# Patient Record
Sex: Male | Born: 1970 | Race: Black or African American | Hispanic: No | Marital: Married | Smoking: Never smoker
Health system: Southern US, Community
[De-identification: ages and names within clinical notes are randomized; demographics above are authoritative.]

## PROBLEM LIST (undated history)

## (undated) ENCOUNTER — Emergency Department (HOSPITAL_BASED_OUTPATIENT_CLINIC_OR_DEPARTMENT_OTHER): Admission: EM | Payer: 59

## (undated) DIAGNOSIS — G20 Parkinson's disease: Secondary | ICD-10-CM

## (undated) DIAGNOSIS — G20A1 Parkinson's disease without dyskinesia, without mention of fluctuations: Secondary | ICD-10-CM

## (undated) DIAGNOSIS — I10 Essential (primary) hypertension: Secondary | ICD-10-CM

## (undated) DIAGNOSIS — Z8711 Personal history of peptic ulcer disease: Secondary | ICD-10-CM

## (undated) DIAGNOSIS — N529 Male erectile dysfunction, unspecified: Secondary | ICD-10-CM

## (undated) DIAGNOSIS — R251 Tremor, unspecified: Secondary | ICD-10-CM

## (undated) DIAGNOSIS — R531 Weakness: Secondary | ICD-10-CM

## (undated) DIAGNOSIS — Z8719 Personal history of other diseases of the digestive system: Secondary | ICD-10-CM

## (undated) DIAGNOSIS — R011 Cardiac murmur, unspecified: Secondary | ICD-10-CM

## (undated) DIAGNOSIS — M1990 Unspecified osteoarthritis, unspecified site: Secondary | ICD-10-CM

## (undated) DIAGNOSIS — M25569 Pain in unspecified knee: Secondary | ICD-10-CM

## (undated) DIAGNOSIS — K5900 Constipation, unspecified: Secondary | ICD-10-CM

## (undated) DIAGNOSIS — G510 Bell's palsy: Secondary | ICD-10-CM

## (undated) DIAGNOSIS — G4700 Insomnia, unspecified: Secondary | ICD-10-CM

## (undated) DIAGNOSIS — N189 Chronic kidney disease, unspecified: Secondary | ICD-10-CM

## (undated) HISTORY — DX: Personal history of peptic ulcer disease: Z87.11

## (undated) HISTORY — DX: Constipation, unspecified: K59.00

## (undated) HISTORY — DX: Parkinson's disease: G20

## (undated) HISTORY — PX: UPPER GI ENDOSCOPY: SHX6162

## (undated) HISTORY — DX: Chronic kidney disease, unspecified: N18.9

## (undated) HISTORY — DX: Tremor, unspecified: R25.1

## (undated) HISTORY — DX: Pain in unspecified knee: M25.569

## (undated) HISTORY — DX: Weakness: R53.1

## (undated) HISTORY — DX: Male erectile dysfunction, unspecified: N52.9

## (undated) HISTORY — PX: NO PAST SURGERIES: SHX2092

## (undated) HISTORY — DX: Cardiac murmur, unspecified: R01.1

## (undated) HISTORY — DX: Parkinson's disease without dyskinesia, without mention of fluctuations: G20.A1

## (undated) HISTORY — DX: Unspecified osteoarthritis, unspecified site: M19.90

## (undated) HISTORY — DX: Insomnia, unspecified: G47.00

## (undated) HISTORY — DX: Personal history of other diseases of the digestive system: Z87.19

## (undated) HISTORY — DX: Essential (primary) hypertension: I10

---

## 1999-06-17 ENCOUNTER — Emergency Department (HOSPITAL_COMMUNITY): Admission: EM | Admit: 1999-06-17 | Discharge: 1999-06-17 | Payer: Self-pay | Admitting: Emergency Medicine

## 2000-07-20 ENCOUNTER — Emergency Department (HOSPITAL_COMMUNITY): Admission: EM | Admit: 2000-07-20 | Discharge: 2000-07-20 | Payer: Self-pay | Admitting: Emergency Medicine

## 2001-05-25 ENCOUNTER — Emergency Department (HOSPITAL_COMMUNITY): Admission: EM | Admit: 2001-05-25 | Discharge: 2001-05-25 | Payer: Self-pay

## 2003-01-08 ENCOUNTER — Emergency Department (HOSPITAL_COMMUNITY): Admission: EM | Admit: 2003-01-08 | Discharge: 2003-01-08 | Payer: Self-pay | Admitting: Emergency Medicine

## 2003-01-08 ENCOUNTER — Encounter: Payer: Self-pay | Admitting: Emergency Medicine

## 2003-02-23 ENCOUNTER — Encounter: Payer: Self-pay | Admitting: Family Medicine

## 2003-02-23 ENCOUNTER — Ambulatory Visit (HOSPITAL_COMMUNITY): Admission: RE | Admit: 2003-02-23 | Discharge: 2003-02-23 | Payer: Self-pay | Admitting: Family Medicine

## 2007-12-18 ENCOUNTER — Emergency Department (HOSPITAL_COMMUNITY): Admission: EM | Admit: 2007-12-18 | Discharge: 2007-12-18 | Payer: Self-pay | Admitting: Emergency Medicine

## 2008-08-14 ENCOUNTER — Emergency Department (HOSPITAL_COMMUNITY): Admission: EM | Admit: 2008-08-14 | Discharge: 2008-08-14 | Payer: Self-pay | Admitting: Emergency Medicine

## 2008-11-16 ENCOUNTER — Emergency Department (HOSPITAL_COMMUNITY): Admission: EM | Admit: 2008-11-16 | Discharge: 2008-11-17 | Payer: Self-pay | Admitting: Family Medicine

## 2008-11-21 ENCOUNTER — Ambulatory Visit: Payer: Self-pay | Admitting: Cardiovascular Disease

## 2008-11-21 DIAGNOSIS — R011 Cardiac murmur, unspecified: Secondary | ICD-10-CM | POA: Insufficient documentation

## 2008-11-21 DIAGNOSIS — I10 Essential (primary) hypertension: Secondary | ICD-10-CM | POA: Insufficient documentation

## 2008-11-21 DIAGNOSIS — R0789 Other chest pain: Secondary | ICD-10-CM | POA: Insufficient documentation

## 2008-11-28 ENCOUNTER — Ambulatory Visit: Payer: Self-pay

## 2008-11-28 ENCOUNTER — Encounter: Payer: Self-pay | Admitting: Cardiovascular Disease

## 2009-02-01 ENCOUNTER — Emergency Department (HOSPITAL_COMMUNITY): Admission: EM | Admit: 2009-02-01 | Discharge: 2009-02-01 | Payer: Self-pay | Admitting: Family Medicine

## 2009-09-02 ENCOUNTER — Emergency Department (HOSPITAL_COMMUNITY): Admission: EM | Admit: 2009-09-02 | Discharge: 2009-09-02 | Payer: Self-pay | Admitting: Emergency Medicine

## 2010-07-16 ENCOUNTER — Emergency Department (HOSPITAL_COMMUNITY): Admission: EM | Admit: 2010-07-16 | Discharge: 2010-07-16 | Payer: Self-pay | Admitting: Family Medicine

## 2010-08-26 ENCOUNTER — Emergency Department (HOSPITAL_COMMUNITY)
Admission: EM | Admit: 2010-08-26 | Discharge: 2010-08-26 | Payer: Self-pay | Source: Home / Self Care | Admitting: Emergency Medicine

## 2010-11-10 LAB — DIFFERENTIAL
Basophils Relative: 1 % (ref 0–1)
Eosinophils Absolute: 0.1 10*3/uL (ref 0.0–0.7)
Eosinophils Relative: 2 % (ref 0–5)
Lymphs Abs: 1.5 10*3/uL (ref 0.7–4.0)
Monocytes Relative: 8 % (ref 3–12)

## 2010-11-10 LAB — CBC
HCT: 44.4 % (ref 39.0–52.0)
Hemoglobin: 14.9 g/dL (ref 13.0–17.0)
MCHC: 33.6 g/dL (ref 30.0–36.0)
RDW: 13.4 % (ref 11.5–15.5)
WBC: 4.5 10*3/uL (ref 4.0–10.5)

## 2010-11-10 LAB — COMPREHENSIVE METABOLIC PANEL
ALT: 34 U/L (ref 0–53)
AST: 26 U/L (ref 0–37)
Alkaline Phosphatase: 38 U/L — ABNORMAL LOW (ref 39–117)
CO2: 27 mEq/L (ref 19–32)
Calcium: 9.1 mg/dL (ref 8.4–10.5)
GFR calc Af Amer: >60 mL/min (ref >60)
Potassium: 3.7 mEq/L (ref 3.5–5.1)
Sodium: 138 mEq/L (ref 135–145)
Total Protein: 7.4 g/dL (ref 6.0–8.3)

## 2010-11-10 LAB — POCT CARDIAC MARKERS: Troponin i, poc: <0.05 ng/mL (ref 0.00–0.09)

## 2010-11-16 LAB — URINALYSIS, ROUTINE W REFLEX MICROSCOPIC
Glucose, UA: NEGATIVE mg/dL
Nitrite: NEGATIVE
Specific Gravity, Urine: 1.022 (ref 1.005–1.030)
pH: 6 (ref 5.0–8.0)

## 2010-11-16 LAB — COMPREHENSIVE METABOLIC PANEL
ALT: 53 U/L (ref 0–53)
Alkaline Phosphatase: 34 U/L — ABNORMAL LOW (ref 39–117)
CO2: 29 mEq/L (ref 19–32)
Chloride: 99 mEq/L (ref 96–112)
Glucose, Bld: 95 mg/dL (ref 70–99)
Potassium: 3.8 mEq/L (ref 3.5–5.1)
Sodium: 136 mEq/L (ref 135–145)
Total Bilirubin: 0.4 mg/dL (ref 0.3–1.2)
Total Protein: 7.4 g/dL (ref 6.0–8.3)

## 2010-11-16 LAB — DIFFERENTIAL
Basophils Relative: 0 % (ref 0–1)
Eosinophils Absolute: 0.1 10*3/uL (ref 0.0–0.7)
Monocytes Relative: 8 % (ref 3–12)
Neutrophils Relative %: 57 % (ref 43–77)

## 2010-11-16 LAB — CBC
Hemoglobin: 14.6 g/dL (ref 13.0–17.0)
RBC: 5.48 MIL/uL (ref 4.22–5.81)
RDW: 13.8 % (ref 11.5–15.5)
WBC: 5.6 10*3/uL (ref 4.0–10.5)

## 2010-12-11 LAB — POCT CARDIAC MARKERS
Myoglobin, poc: 116 ng/mL (ref 12–200)
Troponin i, poc: <0.05 ng/mL (ref 0.00–0.09)

## 2010-12-11 LAB — LIPASE, BLOOD: Lipase: 30 U/L (ref 11–59)

## 2010-12-11 LAB — POCT I-STAT, CHEM 8
BUN: 15 mg/dL (ref 6–23)
Calcium, Ion: 1.11 mmol/L — ABNORMAL LOW (ref 1.12–1.32)
Creatinine, Ser: 1.1 mg/dL (ref 0.4–1.5)
TCO2: 29 mmol/L (ref 0–100)

## 2011-01-13 NOTE — Consult Note (Signed)
NAME:  Ruben Silva, Ruben Silva NO.:  [id number]   MEDICAL RECORD NO.:  [id number]          PATIENT TYPE:  EMS   LOCATION:  [facility]                         FACILITY:  [facility]   PHYSICIAN:  Vania Rea, M.D. DATE OF BIRTH:  Nov 12, 1970   DATE OF CONSULTATION:  11/16/2008  DATE OF DISCHARGE:  11/17/2008                                 CONSULTATION   REQUESTING PHYSICIAN:  Dr. Hilario Quarry.   PRIMARY CARE PHYSICIAN:  Dr. Renaye Rakers.   REASON FOR CONSULTATION:  Chest pain.   IMPRESSION:  1. Atypical chest pain, likely musculoskeletal.  2. Hypertension, controlled.  3. Hypokalemia, likely secondary to antihypertensive-duretic      medications.   RECOMMENDATIONS:  1. Treat hypokalemia with 40 mEq of K-Dur x1 dose.  Advise patient to      eat fresh fruits daily and follow up with his primary care      physician in 1-2 weeks to check serum potassium.  2. Repeat cardiac enzymes stat and if negative discharge the patient      home to follow up with [facility] on Monday to arrange      stress testing.  3. The patient is to return to the emergency room for any worsening of      symptoms.   HISTORY OF PRESENT ILLNESS:  This is a 40 year old athletic African  American gentleman who usually exercises vigorously in the gym about 5  times per week about 2 hours at a time without any difficulty.  However,  he has not been exercising for the past 3 months until this week when he  restarted his exercise and noticed his muscles have been very sore.  The  patient has been having episodic chest pain and pressure at rest for the  past 2 days and eventually decided to come to the emergency room to be  evaluated.  In the emergency room, the patient received aspirin 325 mg  x1 dose and he says the pain has resolved.  The pain is not associated  with shortness of breath, nausea, diaphoresis nor dizziness.  It is not  aggravated by activity.  It is not relieved by rest.  It has  not  particular pattern.  The patient says he feels convinced that it is  because he has been overdoing his workouts after a prolonged rest, but  he decided to come to check it out to be on the safe side.   The patient is hypertensive and does take Avalide with  hydrochlorothiazide daily.  He has been told in the past that he has a  heart murmur, but has never had a cardiac stress test.   He denies any fever, cough or cold.  He denies any shortness of breath,  orthopnea, lower extremity edema or PND.   PAST MEDICAL HISTORY:  1. Hypertension.  2. History of heart murmur.   MEDICATIONS:  Avalide 150 mg daily.   ALLERGIES:  NO KNOWN DRUG ALLERGIES.   SOCIAL HISTORY:  No history of tobacco, alcohol or illicit drug use.  He  is currently unemployed.  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   FAMILY HISTORY:  Significant for a mother with hypertension, otherwise  unremarkable.   REVIEW OF SYSTEMS:  A 10-point review of systems was essentially  negative.   PHYSICAL EXAMINATION:  GENERAL:  This is a young athletic African  American gentleman reclining in the stretcher in no acute distress.  VITAL SIGNS:  Temperature 97.1, pulse 60, respirations 16, blood  pressure 123/83.  He is saturating 100% on room air.  HEENT:  His pupils are round and equal.  Mucous membranes pink and  anicteric.  He is not dehydrated.  NECK:  No cervical lymphadenopathy or thyromegaly.  No jugulovenous  distention.  No carotid bruit.  CHEST:  Clear to auscultation bilaterally.  CARDIOVASCULAR:  Regular rhythm.  No murmur heard in the emergency room.  ABDOMEN:  Soft and nontender.  There are no masses.  EXTREMITIES:  Without edema.  He has 2+ dorsalis pedis pulses  bilaterally.  CENTRAL NERVOUS SYSTEM:  Cranial nerves II through XII are grossly  intact and he has no focal neurologic deficit.   LABORATORY DATA:  His hemoglobin is 14.3, hematocrit 42, sodium 139,  potassium 3.3, chloride 100, BUN  15, creatinine 1.1, glucose 102.  Ionized calcium is 1.1.  Cardiac markers; his myoglobin is 108, CK-MB  1.2, troponin is undetectable.  His lipase is 30.  EKG shows sinus  bradycardia with no ST segment changes.  His portable chest x-ray shows  clear lungs without infiltrate.      Vania Rea, M.D.  Electronically Signed     LC/MEDQ  D:  11/17/2008  T:  11/17/2008  Job:  [id number]   cc:   Renaye Rakers, M.D.  [facility]
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
## 2011-03-03 ENCOUNTER — Encounter: Payer: Self-pay | Admitting: Cardiovascular Disease

## 2011-05-26 LAB — DIFFERENTIAL
Basophils Relative: 0
Lymphs Abs: 2
Monocytes Relative: 8
Neutro Abs: 3.2
Neutrophils Relative %: 55

## 2011-05-26 LAB — BASIC METABOLIC PANEL
Calcium: 9.3
Creatinine, Ser: 1.06
GFR calc Af Amer: >60

## 2011-05-26 LAB — CBC
RBC: 5.31
WBC: 5.8

## 2011-05-27 ENCOUNTER — Emergency Department (HOSPITAL_COMMUNITY)
Admission: EM | Admit: 2011-05-27 | Discharge: 2011-05-27 | Disposition: A | Discharge status: Home / Self Care | Payer: Self-pay | Attending: Emergency Medicine | Admitting: Emergency Medicine

## 2011-05-27 DIAGNOSIS — I10 Essential (primary) hypertension: Secondary | ICD-10-CM | POA: Insufficient documentation

## 2011-05-27 DIAGNOSIS — K219 Gastro-esophageal reflux disease without esophagitis: Secondary | ICD-10-CM | POA: Insufficient documentation

## 2011-05-27 DIAGNOSIS — R0789 Other chest pain: Secondary | ICD-10-CM | POA: Insufficient documentation

## 2011-07-13 ENCOUNTER — Emergency Department (HOSPITAL_COMMUNITY): Payer: Self-pay

## 2011-07-13 ENCOUNTER — Emergency Department (HOSPITAL_COMMUNITY)
Admission: EM | Admit: 2011-07-13 | Discharge: 2011-07-13 | Disposition: A | Discharge status: Home / Self Care | Payer: Self-pay | Attending: Emergency Medicine | Admitting: Emergency Medicine

## 2011-07-13 ENCOUNTER — Encounter (HOSPITAL_COMMUNITY): Payer: Self-pay | Admitting: *Deleted

## 2011-07-13 DIAGNOSIS — M25519 Pain in unspecified shoulder: Secondary | ICD-10-CM | POA: Insufficient documentation

## 2011-07-13 DIAGNOSIS — M25619 Stiffness of unspecified shoulder, not elsewhere classified: Secondary | ICD-10-CM | POA: Insufficient documentation

## 2011-07-13 MED ORDER — IBUPROFEN 800 MG PO TABS: 800.0000 mg | ORAL_TABLET | Freq: Three times a day (TID) | ORAL | Status: AC

## 2011-07-13 NOTE — ED Notes (Signed)
Pt reports R shoulder pain x 3 weeks.  Denies injury but states that he was working out prior to onset of pain.  Pt also reports inability to sleep d/t pain.

## 2011-08-12 ENCOUNTER — Emergency Department (HOSPITAL_COMMUNITY)
Admission: EM | Admit: 2011-08-12 | Discharge: 2011-08-12 | Disposition: A | Discharge status: Home / Self Care | Payer: Self-pay | Attending: Emergency Medicine | Admitting: Emergency Medicine

## 2011-08-12 ENCOUNTER — Encounter (HOSPITAL_COMMUNITY): Payer: Self-pay | Admitting: *Deleted

## 2011-08-12 DIAGNOSIS — N390 Urinary tract infection, site not specified: Secondary | ICD-10-CM | POA: Insufficient documentation

## 2011-08-12 DIAGNOSIS — I10 Essential (primary) hypertension: Secondary | ICD-10-CM | POA: Insufficient documentation

## 2011-08-12 DIAGNOSIS — R509 Fever, unspecified: Secondary | ICD-10-CM | POA: Insufficient documentation

## 2011-08-12 DIAGNOSIS — R319 Hematuria, unspecified: Secondary | ICD-10-CM | POA: Insufficient documentation

## 2011-08-12 LAB — URINALYSIS, ROUTINE W REFLEX MICROSCOPIC
Glucose, UA: NEGATIVE mg/dL
Specific Gravity, Urine: 1.023 (ref 1.005–1.030)
Urobilinogen, UA: 1 mg/dL (ref 0.0–1.0)

## 2011-08-12 LAB — URINE MICROSCOPIC-ADD ON

## 2011-08-12 LAB — GLUCOSE, CAPILLARY

## 2011-08-12 MED ORDER — CIPROFLOXACIN HCL 500 MG PO TABS: 500.0000 mg | ORAL_TABLET | Freq: Two times a day (BID) | ORAL | Status: AC

## 2011-08-12 MED ORDER — PHENAZOPYRIDINE HCL 200 MG PO TABS: 200.0000 mg | ORAL_TABLET | Freq: Three times a day (TID) | ORAL | Status: AC | PRN

## 2011-08-12 MED ORDER — IRBESARTAN-HYDROCHLOROTHIAZIDE 150-12.5 MG PO TABS: 1.0000 | ORAL_TABLET | Freq: Every day | ORAL | Status: DC

## 2011-08-12 MED ORDER — CIPROFLOXACIN HCL 500 MG PO TABS
500.0000 mg | ORAL_TABLET | Freq: Once | ORAL | Status: AC
  Administered 2011-08-12: 500 mg via ORAL
  Filled 2011-08-12: qty 1

## 2011-08-12 NOTE — ED Provider Notes (Signed)
History     CSN: [id number] Arrival date & time: 08/12/2011  8:33 PM   First MD Initiated Contact with Patient 08/12/11 2157      Chief Complaint  Patient presents with  . Hematuria    (Consider location/radiation/quality/duration/timing/severity/associated sxs/prior treatment) Patient is a 40 y.o. male presenting with hematuria. The history is provided by the patient.  Hematuria This is a new problem. The current episode started today. The problem is unchanged. He describes the hematuria as gross hematuria. The hematuria occurs throughout his entire urinary stream. The pain is mild. He describes his urine color as dark red. Irritative symptoms include frequency and urgency. Obstructive symptoms do not include dribbling, incomplete emptying, an intermittent stream, a slower stream, straining or a weak stream. Associated symptoms include dysuria and fever. Pertinent negatives include no abdominal pain, chills, flank pain, genital pain, hesitancy, inability to urinate, nausea or vomiting. He is sexually active. There is no history of BPH, kidney stones or prostatitis.   he ran out of his blood pressure medicine one week ago.  Past Medical History  Diagnosis Date  . Murmur   . Hypertension     History reviewed. No pertinent past surgical history.  Family History  Problem Relation Age of Onset  . Diabetes Other   . Hypertension Other     History  Substance Use Topics  . Smoking status: Unknown If Ever Smoked  . Smokeless tobacco: Not on file  . Alcohol Use: No      Review of Systems  Constitutional: Positive for fever. Negative for chills.  Gastrointestinal: Negative for nausea, vomiting and abdominal pain.  Genitourinary: Positive for dysuria, urgency, frequency and hematuria. Negative for hesitancy, flank pain and incomplete emptying.  All other systems reviewed and are negative.    Allergies  Review of patient's allergies indicates no known allergies.  Home  Medications   Current Outpatient Rx  Name Route Sig Dispense Refill  . DIPHENHYDRAMINE-APAP (SLEEP) 25-500 MG PO TABS Oral Take 1 tablet by mouth at bedtime as needed. sleep     . IRBESARTAN-HYDROCHLOROTHIAZIDE 150-12.5 MG PO TABS Oral Take 1 tablet by mouth daily.       BP 162/98  Pulse 110  Temp(Src) 100.9 F (38.3 C) (Oral)  Resp 20  SpO2 100%  Physical Exam  Constitutional: He is oriented to person, place, and time. He appears well-developed and well-nourished.  HENT:  Head: Normocephalic.  Eyes: Conjunctivae are normal. Pupils are equal, round, and reactive to light.  Neck: Normal range of motion. Neck supple.  Cardiovascular: Normal rate.   Pulmonary/Chest: Effort normal and breath sounds normal.  Abdominal: Soft. Bowel sounds are normal.  Genitourinary: No penile tenderness.       There is mild urethral bleeding, with palpation. No penile deformity. Testes are normal. There is no genital lesion. No groin adenopathy.  Neurological: He is alert and oriented to person, place, and time. No cranial nerve deficit. He exhibits normal muscle tone. Coordination normal.  Skin: Skin is warm and dry.  Psychiatric: He has a normal mood and affect. His behavior is normal.    ED Course  Procedures (including critical care time)  Labs Reviewed  URINALYSIS, ROUTINE W REFLEX MICROSCOPIC - Abnormal; Notable for the following:    Color, Urine AMBER (*) BIOCHEMICALS MAY BE AFFECTED BY COLOR   APPearance TURBID (*)    Hgb urine dipstick LARGE (*)    Ketones, ur TRACE (*)    Protein, ur 100 (*)    Nitrite  POSITIVE (*)    Leukocytes, UA LARGE (*)    All other components within normal limits  URINE MICROSCOPIC-ADD ON - Abnormal; Notable for the following:    Bacteria, UA FEW (*)    All other components within normal limits  GLUCOSE, CAPILLARY  POCT CBG MONITORING   ED treatment: Cipro by mouth.  No diagnosis found.    MDM  Uncomplicated urinary tract infection, doubt  pyelonephritis, metabolic instability, or serious bacterial infection        Flint Melter, MD 08/12/11 2309

## 2011-08-12 NOTE — ED Notes (Signed)
Pt in c/o hematuria x2 days and slight burning with urination

## 2011-09-11 NOTE — ED Provider Notes (Signed)
Medical screening examination/treatment/procedure(s) were performed by non-physician practitioner and as supervising physician I was immediately available for consultation/collaboration.  Celia Friedland K Neveyah Garzon-Rasch, MD 09/11/11 2322 

## 2011-09-11 NOTE — ED Provider Notes (Signed)
History     CSN: [id number]  Arrival date & time 07/13/11  1225   First MD Initiated Contact with Patient 07/13/11 1447      Chief Complaint  Patient presents with  . Shoulder Pain    (Consider location/radiation/quality/duration/timing/severity/associated sxs/prior treatment) HPI Comments: Patient reports that he has had pain in his right shoulder for the past 3 weeks.  He began having the pain after lifting weights.  Patient is a 41 y.o. male presenting with shoulder pain.  Shoulder Pain This is a new problem. Episode onset: Three weeks. The problem has been unchanged. Pertinent negatives include no chest pain, diaphoresis, fever, joint swelling, neck pain, numbness, rash or weakness. Exacerbated by: movement. He has tried nothing for the symptoms.    Past Medical History  Diagnosis Date  . Murmur   . Hypertension     History reviewed. No pertinent past surgical history.  Family History  Problem Relation Age of Onset  . Diabetes Other   . Hypertension Other     History  Substance Use Topics  . Smoking status: Unknown If Ever Smoked  . Smokeless tobacco: Not on file  . Alcohol Use: No      Review of Systems  Constitutional: Negative for fever and diaphoresis.  HENT: Negative for neck pain.   Cardiovascular: Negative for chest pain.  Musculoskeletal: Negative for back pain and joint swelling.  Skin: Negative for rash.  Neurological: Negative for dizziness, weakness and numbness.    Allergies  Review of patient's allergies indicates no known allergies.  Home Medications   Current Outpatient Rx  Name Route Sig Dispense Refill  . IRBESARTAN-HYDROCHLOROTHIAZIDE 150-12.5 MG PO TABS Oral Take 1 tablet by mouth daily.     Marland Kitchen DIPHENHYDRAMINE-APAP (SLEEP) 25-500 MG PO TABS Oral Take 1 tablet by mouth at bedtime as needed. sleep     . IRBESARTAN-HYDROCHLOROTHIAZIDE 150-12.5 MG PO TABS Oral Take 1 tablet by mouth daily. 30 tablet 0    BP 126/77  Pulse 78   Temp(Src) 98.4 F (36.9 C) (Oral)  Resp 20  Wt 218 lb (98.884 kg)  SpO2 100%  Physical Exam  Nursing note and vitals reviewed. Constitutional: He is oriented to person, place, and time. He appears well-developed and well-nourished. No distress.  HENT:  Head: Normocephalic and atraumatic.  Neck: Normal range of motion. Neck supple.  Cardiovascular: Normal rate and regular rhythm.   Pulmonary/Chest: Effort normal and breath sounds normal.  Musculoskeletal:       Right shoulder: He exhibits decreased range of motion. He exhibits no bony tenderness, no swelling, no effusion, no deformity and normal pulse.  Neurological: He is alert and oriented to person, place, and time. No sensory deficit. Gait normal.  Skin: Skin is warm and dry. No rash noted. He is not diaphoretic. No erythema.  Psychiatric: He has a normal mood and affect.    ED Course  Procedures (including critical care time)  Labs Reviewed - No data to display No results found.   1. Shoulder pain       MDM  Xray negative for fracture or dislocation.  Patient instructed to follow up with orthopedics if pain does not improve.        Pascal Lux [facility] 09/11/11 1240

## 2012-04-22 ENCOUNTER — Emergency Department (HOSPITAL_COMMUNITY)
Admission: EM | Admit: 2012-04-22 | Discharge: 2012-04-23 | Disposition: A | Discharge status: Home / Self Care | Payer: 59 | Attending: Emergency Medicine | Admitting: Emergency Medicine

## 2012-04-22 ENCOUNTER — Encounter (HOSPITAL_COMMUNITY): Payer: Self-pay | Admitting: Family Medicine

## 2012-04-22 DIAGNOSIS — M79609 Pain in unspecified limb: Secondary | ICD-10-CM | POA: Insufficient documentation

## 2012-04-22 DIAGNOSIS — I10 Essential (primary) hypertension: Secondary | ICD-10-CM | POA: Insufficient documentation

## 2012-04-22 DIAGNOSIS — Z760 Encounter for issue of repeat prescription: Secondary | ICD-10-CM | POA: Insufficient documentation

## 2012-04-22 HISTORY — DX: Bell's palsy: G51.0

## 2012-04-22 LAB — BASIC METABOLIC PANEL
BUN: 18 mg/dL (ref 6–23)
Calcium: 9.2 mg/dL (ref 8.4–10.5)
Creatinine, Ser: 1.03 mg/dL (ref 0.50–1.35)
GFR calc Af Amer: >90 mL/min (ref >90)
GFR calc non Af Amer: 90 mL/min — ABNORMAL LOW (ref >90)

## 2012-04-22 LAB — CBC
HCT: 40.6 % (ref 39.0–52.0)
MCH: 26.3 pg (ref 26.0–34.0)
MCHC: 33.5 g/dL (ref 30.0–36.0)
MCV: 78.5 fL (ref 78.0–100.0)
RDW: 13.9 % (ref 11.5–15.5)

## 2012-04-22 NOTE — ED Notes (Signed)
Pt in to ED stating that he has been out of BP medication x 1 month. Reports having headache, left arm pain, and mild discomfort to chest.  Pt is A/O x4. Skin warm and dry. Respirations even and unlabored. NAD noted at this time.

## 2012-04-23 ENCOUNTER — Encounter (HOSPITAL_COMMUNITY): Payer: Self-pay | Admitting: *Deleted

## 2012-04-23 ENCOUNTER — Emergency Department (HOSPITAL_COMMUNITY)
Admission: EM | Admit: 2012-04-23 | Discharge: 2012-04-23 | Disposition: A | Discharge status: Home / Self Care | Payer: 59 | Attending: Emergency Medicine | Admitting: Emergency Medicine

## 2012-04-23 DIAGNOSIS — I10 Essential (primary) hypertension: Secondary | ICD-10-CM | POA: Insufficient documentation

## 2012-04-23 DIAGNOSIS — Z760 Encounter for issue of repeat prescription: Secondary | ICD-10-CM

## 2012-04-23 MED ORDER — IRBESARTAN-HYDROCHLOROTHIAZIDE 150-12.5 MG PO TABS: 1.0000 | ORAL_TABLET | Freq: Every day | ORAL | Status: DC

## 2012-04-23 NOTE — ED Provider Notes (Signed)
History     CSN: [id number]  Arrival date & time 04/23/12  1625   First MD Initiated Contact with Patient 04/23/12 1657      Chief Complaint  Patient presents with  . Medication Refill    (Consider location/radiation/quality/duration/timing/severity/associated sxs/prior treatment) The history is provided by the patient and medical records.   Ruben Silva is a 41 y.o. male presents to the emergency department complaining of medication refill for BP meds.  The onset of the symptoms was  gradual starting 1 days ago.  The patient has associated headache.  The symptoms have been  persistent, stabilized.  nothing makes the symptoms worse and ASA makes symptoms better.  The patient denies chest pain, SOB, vision changes, abdominal pain, nausea, vomiting diarrhea.   He normally takes Avalide 150-12.5 mg 1 tab QD. Ruben Ligas, MD is his PCP.  Last dose was 1 week ago.  Headache began last night after a bout of stress.    The patient has medical history significant for:  Past Medical History  Diagnosis Date  . Murmur   . Hypertension   . Bell's palsy    History reviewed. No pertinent past surgical history.  Family History  Problem Relation Age of Onset  . Diabetes Other   . Hypertension Other     History  Substance Use Topics  . Smoking status: Never Smoker   . Smokeless tobacco: Not on file  . Alcohol Use: No    Review of Systems  Constitutional: Negative for fever, diaphoresis, appetite change, fatigue and unexpected weight change.  HENT: Negative for mouth sores, neck pain and neck stiffness.   Eyes: Negative for visual disturbance.  Respiratory: Negative for cough, chest tightness, shortness of breath and wheezing.   Cardiovascular: Negative for chest pain.  Gastrointestinal: Negative for nausea, vomiting, abdominal pain, diarrhea and constipation.  Genitourinary: Negative for dysuria, urgency, frequency and hematuria.  Musculoskeletal: Negative for back pain.  Skin:  Negative for rash.  Neurological: Positive for headaches. Negative for syncope and light-headedness.  Hematological: Does not bruise/bleed easily.  Psychiatric/Behavioral: Negative for disturbed wake/sleep cycle. The patient is not nervous/anxious.     Allergies  Review of patient's allergies indicates no known allergies.  Home Medications   Current Outpatient Rx  Name Route Sig Dispense Refill  . DIPHENHYDRAMINE-APAP (SLEEP) 25-500 MG PO TABS Oral Take 1 tablet by mouth at bedtime as needed. sleep     . IRBESARTAN-HYDROCHLOROTHIAZIDE 150-12.5 MG PO TABS Oral Take 1 tablet by mouth daily. 30 tablet 0  . IRBESARTAN-HYDROCHLOROTHIAZIDE 150-12.5 MG PO TABS Oral Take 1 tablet by mouth daily. 30 tablet 0    BP 153/86  Pulse 78  Temp 98 F (36.7 C) (Oral)  Resp 18  SpO2 100%  Physical Exam  Nursing note and vitals reviewed. Constitutional: He appears well-developed and well-nourished. No distress.  HENT:  Head: Normocephalic and atraumatic.  Mouth/Throat: Uvula is midline, oropharynx is clear and moist and mucous membranes are normal. No oropharyngeal exudate.  Eyes: Conjunctivae are normal. No scleral icterus.  Neck: Normal range of motion. Neck supple.  Cardiovascular: Normal rate, regular rhythm and intact distal pulses.  Exam reveals no gallop and no friction rub.   Murmur heard. Pulmonary/Chest: Effort normal and breath sounds normal. No respiratory distress. He has no wheezes. He exhibits no tenderness.  Abdominal: Soft. Bowel sounds are normal. He exhibits no mass. There is no tenderness. There is no rebound and no guarding.  Musculoskeletal: Normal range of motion. He exhibits  no edema.  Neurological: He is alert.       Speech is clear and goal oriented Moves extremities without ataxia  Skin: Skin is warm and dry. No rash noted. He is not diaphoretic.  Psychiatric: He has a normal mood and affect.    ED Course  Procedures (including critical care time)  Labs Reviewed  - No data to display No results found.   1. Medication refill       MDM  Ruben Silva presents for medication refill.  He was unable to get an appointment with his PCP to operate a refill.  Patient is alert and oriented, nontoxic-appearing.  He has no signs of endorgan damage or hypertensive urgency.  Will refill medication for one month advised patient he needs to followup with his primary care physician. I have also discussed reasons to return immediately to the ER.  Patient states understanding.     1. Medications: Avalide 2. Treatment: Take medications as prescribed 3. Follow Up: With primary care physician         Dierdre Forth, PA-C 04/23/12 1735

## 2012-04-23 NOTE — ED Notes (Signed)
Patient states that he needs his medication refill for his hypertension.  Patient is starting to have a headache and he knows that his blood pressure is high for him.

## 2012-04-23 NOTE — ED Notes (Signed)
Not in WR when called to be taken to acute room

## 2012-04-24 NOTE — ED Provider Notes (Signed)
Medical screening examination/treatment/procedure(s) were performed by non-physician practitioner and as supervising physician I was immediately available for consultation/collaboration.   Lyanne Co, MD 04/24/12 [phone]

## 2012-11-08 ENCOUNTER — Emergency Department (HOSPITAL_COMMUNITY)
Admission: EM | Admit: 2012-11-08 | Discharge: 2012-11-08 | Disposition: A | Discharge status: Home / Self Care | Payer: 59 | Attending: Emergency Medicine | Admitting: Emergency Medicine

## 2012-11-08 ENCOUNTER — Encounter (HOSPITAL_COMMUNITY): Payer: Self-pay | Admitting: Emergency Medicine

## 2012-11-08 DIAGNOSIS — R202 Paresthesia of skin: Secondary | ICD-10-CM

## 2012-11-08 DIAGNOSIS — G609 Hereditary and idiopathic neuropathy, unspecified: Secondary | ICD-10-CM | POA: Insufficient documentation

## 2012-11-08 DIAGNOSIS — Z8679 Personal history of other diseases of the circulatory system: Secondary | ICD-10-CM | POA: Insufficient documentation

## 2012-11-08 DIAGNOSIS — Z79899 Other long term (current) drug therapy: Secondary | ICD-10-CM | POA: Insufficient documentation

## 2012-11-08 DIAGNOSIS — Z791 Long term (current) use of non-steroidal anti-inflammatories (NSAID): Secondary | ICD-10-CM | POA: Insufficient documentation

## 2012-11-08 DIAGNOSIS — I10 Essential (primary) hypertension: Secondary | ICD-10-CM | POA: Insufficient documentation

## 2012-11-08 DIAGNOSIS — Z8669 Personal history of other diseases of the nervous system and sense organs: Secondary | ICD-10-CM | POA: Insufficient documentation

## 2012-11-08 DIAGNOSIS — G629 Polyneuropathy, unspecified: Secondary | ICD-10-CM

## 2012-11-08 MED ORDER — IBUPROFEN 800 MG PO TABS
800.0000 mg | ORAL_TABLET | Freq: Once | ORAL | Status: AC
  Administered 2012-11-08: 800 mg via ORAL
  Filled 2012-11-08: qty 1

## 2012-11-08 MED ORDER — IBUPROFEN 800 MG PO TABS: 800.0000 mg | ORAL_TABLET | Freq: Three times a day (TID) | ORAL | Status: DC

## 2012-11-08 NOTE — ED Notes (Signed)
States that he has a hx of Bell's Palsy. States that for the past two weeks he wakes up with his left arm numb. States that he feels like his arm is asleep.

## 2012-11-08 NOTE — ED Provider Notes (Signed)
History  This chart was scribed for non-physician practitioner working with Hilario Quarry, MD, by Candelaria Stagers, ED Scribe. This patient was seen in room WTR9/WTR9 and the patient's care was started at 7:51 PM   CSN: [id number]  Arrival date & time 11/08/12  1845   First MD Initiated Contact with Patient 11/08/12 1942      Chief Complaint  Patient presents with  . Arm Pain     The history is provided by the patient. No language interpreter was used.   Ruben Silva is a 42 y.o. male who presents to the Emergency Department complaining of intermittent left arm numbness that started about two weeks ago and has gradually worsened.  He reports more numbness to the 5th finger of the left hand.  Pt states that the numbness only happens at night no matter which side he sleeps on.  Pt states that when he moves the arm the numbness goes away, but is gradually taking longer to get better.  He states that the hand cramps during the episodes and he has trouble opening the hand.  He denies swelling or pain.  Pt reports that he is experiencing associated weakness to the left arm.  Pt has h/o Bell's palsy or HTN.  He denies any new physical activity.          Past Medical History  Diagnosis Date  . Murmur   . Hypertension   . Bell's palsy     History reviewed. No pertinent past surgical history.  Family History  Problem Relation Age of Onset  . Diabetes Other   . Hypertension Other     History  Substance Use Topics  . Smoking status: Never Smoker   . Smokeless tobacco: Not on file  . Alcohol Use: No      Review of Systems  HENT: Negative for neck pain.   Musculoskeletal: Negative for arthralgias.  Skin: Negative for color change.  Neurological: Positive for numbness (numbness to left arm).  All other systems reviewed and are negative.    Allergies  Review of patient's allergies indicates no known allergies.  Home Medications   Current Outpatient Rx  Name  Route  Sig   Dispense  Refill  . irbesartan-hydrochlorothiazide (AVALIDE) 150-12.5 MG per tablet   Oral   Take 1 tablet by mouth daily.   30 tablet   0   . ibuprofen (ADVIL,MOTRIN) 800 MG tablet   Oral   Take 1 tablet (800 mg total) by mouth 3 (three) times daily.   21 tablet   0     BP 139/92  Pulse 84  Temp(Src) 97.6 F (36.4 C) (Oral)  Resp 20  SpO2 100%  Physical Exam  Nursing note and vitals reviewed. Constitutional: He is oriented to person, place, and time. He appears well-developed and well-nourished. No distress.  HENT:  Head: Normocephalic and atraumatic.  Eyes: EOM are normal.  Neck: Normal range of motion. Neck supple. No tracheal deviation present.  No spinal or para spinal tenderness of C-spine. No decreased ROM.    Cardiovascular: Normal rate.   Pulmonary/Chest: Effort normal. No respiratory distress.  Musculoskeletal: Normal range of motion.  No decreased ROM of left shoulder.   No tenderness to palpation.  With shoulder joint fully extended no exacerbation of numbness.  Left hand grip strength is physiologic.  Cap refill is less than 2 sec in all five fingers. Appreciates mild decreased sensation in finger tips with no discoloration.  Fingers are warm.  Neurological: He is alert and oriented to person, place, and time.  Skin: Skin is warm and dry.  Psychiatric: He has a normal mood and affect. His behavior is normal.    ED Course  Procedures   DIAGNOSTIC STUDIES: Oxygen Saturation is 100% on room air, normal by my interpretation.    COORDINATION OF CARE:  7:58 PM Discussed course of care with pt which includes CT neck.  Will prescribe antiinflammatory and provide referral to orthopaedist.  Pt understands and agrees.   8:34 PM Consult with Dr. Lequita Halt who agrees sx sound c/o nerve irritation.  So long as pt sx are not cardiac then it is most appropriate to f/u with hand specialist.  Questioned pt again he denies SOB, chest pain, weakness, or dizziness.  Reports BP  sx are under control.        Labs Reviewed - No data to display No results found.   1. Peripheral neuropathy   2. Left hand paresthesia       MDM  Pt has been advised of the symptoms that warrant their return to the ED. Patient has voiced understanding and has agreed to follow-up with the PCP or specialist.  I personally performed the services described in this documentation, which was scribed in my presence. The recorded information has been reviewed and is accurate.        Dorthula Matas, PA-C 11/10/12 1628

## 2012-11-08 NOTE — ED Notes (Signed)
Called to come to room, no answer 

## 2012-11-15 NOTE — ED Provider Notes (Signed)
History/physical exam/procedure(s) were performed by non-physician practitioner and as supervising physician I was immediately available for consultation/collaboration. I have reviewed all notes and am in agreement with care and plan.   Hilario Quarry, MD 11/15/12 1031

## 2013-12-16 ENCOUNTER — Emergency Department (HOSPITAL_COMMUNITY): Payer: 59

## 2013-12-16 ENCOUNTER — Emergency Department (HOSPITAL_COMMUNITY)
Admission: EM | Admit: 2013-12-16 | Discharge: 2013-12-16 | Disposition: A | Discharge status: Home / Self Care | Payer: 59 | Attending: Emergency Medicine | Admitting: Emergency Medicine

## 2013-12-16 ENCOUNTER — Encounter (HOSPITAL_COMMUNITY): Payer: Self-pay | Admitting: Emergency Medicine

## 2013-12-16 DIAGNOSIS — S8990XA Unspecified injury of unspecified lower leg, initial encounter: Secondary | ICD-10-CM | POA: Insufficient documentation

## 2013-12-16 DIAGNOSIS — Z791 Long term (current) use of non-steroidal anti-inflammatories (NSAID): Secondary | ICD-10-CM | POA: Insufficient documentation

## 2013-12-16 DIAGNOSIS — M25562 Pain in left knee: Secondary | ICD-10-CM

## 2013-12-16 DIAGNOSIS — Y9367 Activity, basketball: Secondary | ICD-10-CM | POA: Insufficient documentation

## 2013-12-16 DIAGNOSIS — S86001A Unspecified injury of right Achilles tendon, initial encounter: Secondary | ICD-10-CM

## 2013-12-16 DIAGNOSIS — S99919A Unspecified injury of unspecified ankle, initial encounter: Principal | ICD-10-CM

## 2013-12-16 DIAGNOSIS — S99929A Unspecified injury of unspecified foot, initial encounter: Principal | ICD-10-CM

## 2013-12-16 DIAGNOSIS — X58XXXA Exposure to other specified factors, initial encounter: Secondary | ICD-10-CM | POA: Insufficient documentation

## 2013-12-16 DIAGNOSIS — Z8669 Personal history of other diseases of the nervous system and sense organs: Secondary | ICD-10-CM | POA: Insufficient documentation

## 2013-12-16 DIAGNOSIS — Z79899 Other long term (current) drug therapy: Secondary | ICD-10-CM | POA: Insufficient documentation

## 2013-12-16 DIAGNOSIS — Y92838 Other recreation area as the place of occurrence of the external cause: Secondary | ICD-10-CM

## 2013-12-16 DIAGNOSIS — I10 Essential (primary) hypertension: Secondary | ICD-10-CM | POA: Insufficient documentation

## 2013-12-16 DIAGNOSIS — Y9239 Other specified sports and athletic area as the place of occurrence of the external cause: Secondary | ICD-10-CM | POA: Insufficient documentation

## 2013-12-16 DIAGNOSIS — R011 Cardiac murmur, unspecified: Secondary | ICD-10-CM | POA: Insufficient documentation

## 2013-12-16 MED ORDER — IRBESARTAN-HYDROCHLOROTHIAZIDE 150-12.5 MG PO TABS: 1.0000 | ORAL_TABLET | Freq: Every day | ORAL | Status: DC

## 2013-12-16 MED ORDER — NAPROXEN 500 MG PO TABS: 500.0000 mg | ORAL_TABLET | Freq: Two times a day (BID) | ORAL | Status: DC
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
## 2013-12-16 NOTE — Discharge Instructions (Signed)
Clinically very concerned about a right Achilles tendon injury. Clearly no complete rupture. Use a walking boot. Followup with orthopedics. Take Naprosyn as directed. X-rays of your left knee were normal x-rays of your right ankle were normal. He can also discuss with fourth toe about the chronic left knee pain.

## 2013-12-16 NOTE — ED Provider Notes (Signed)
CSN: [id number]     Arrival date & time 12/16/13  1335 History   First MD Initiated Contact with Patient 12/16/13 1423     Chief Complaint  Patient presents with  . Leg Pain     (Consider location/radiation/quality/duration/timing/severity/associated sxs/prior Treatment) Patient is a 43 y.o. male presenting with leg pain. The history is provided by the patient.  Leg Pain Associated symptoms: no back pain and no fever    patient with injury to the back of right ankle one week ago while playing basketball. Patient has noticed swelling to that ankle and tenderness along the posterior aspect of the ankle. Patient still able to stand on his toes fine. Patient also with complaint of left knee pain which has been ongoing for about a year but getting worse lately. Patient thinks he has an old injury to that was undiagnosed. No other complaints. Pain is mild but 3/10.  Past Medical History  Diagnosis Date  . Murmur   . Hypertension   . Bell's palsy    History reviewed. No pertinent past surgical history. Family History  Problem Relation Age of Onset  . Diabetes Other   . Hypertension Other    History  Substance Use Topics  . Smoking status: Never Smoker   . Smokeless tobacco: Not on file  . Alcohol Use: No    Review of Systems  Constitutional: Negative for fever.  HENT: Negative for congestion.   Eyes: Negative for redness.  Respiratory: Negative for shortness of breath.   Cardiovascular: Negative for chest pain.  Gastrointestinal: Negative for abdominal pain.  Genitourinary: Negative for dysuria.  Musculoskeletal: Positive for arthralgias. Negative for back pain.  Skin: Negative for rash.  Neurological: Negative for headaches.  Hematological: Does not bruise/bleed easily.  Psychiatric/Behavioral: Negative for confusion.      Allergies  Review of patient's allergies indicates no known allergies.  Home Medications   Prior to Admission medications   Medication Sig Start  Date End Date Taking? Authorizing Provider  ibuprofen (ADVIL,MOTRIN) 200 MG tablet Take 400 mg by mouth every 6 (six) hours as needed for mild pain.   Yes Historical Provider, MD  irbesartan-hydrochlorothiazide (AVALIDE) 150-12.5 MG per tablet Take 1 tablet by mouth daily.   Yes Historical Provider, MD  naproxen (NAPROSYN) 500 MG tablet Take 1 tablet (500 mg total) by mouth 2 (two) times daily. 12/16/13   Shelda JakesScott W. Katriana Dortch, MD   BP 156/93  Pulse 85  Temp(Src) 98.2 F (36.8 C) (Oral)  Resp 20  SpO2 99% Physical Exam  Nursing note and vitals reviewed. Constitutional: He is oriented to person, place, and time. He appears well-developed and well-nourished. No distress.  HENT:  Head: Normocephalic and atraumatic.  Mouth/Throat: Oropharynx is clear and moist.  Eyes: Conjunctivae are normal. Pupils are equal, round, and reactive to light.  Neck: Normal range of motion.  Cardiovascular: Normal rate and normal heart sounds.   Pulmonary/Chest: Effort normal and breath sounds normal. No respiratory distress.  Abdominal: Soft. Bowel sounds are normal. There is no tenderness.  Musculoskeletal: Normal range of motion.  Swelling to the medial and lateral aspect of the right ankle. With a little bit of ecchymosis. Tenderness to the distal portion of the Achilles tendon. Good movement of the foot to plantar flexion and dorsiflexion. No evidence of Achilles tendon rupture. It could be a partial tear. Dorsalis pedis pulses 2+. Left knee without evidence of effusion the patellas not dislocated no joint line tenderness. No laxity. No collateral lateral ligament tenderness.  Neurological: He is alert and oriented to person, place, and time. No cranial nerve deficit. He exhibits normal muscle tone. Coordination normal.  Skin: Skin is warm. No erythema.    ED Course  Procedures (including critical care time) Labs Review Labs Reviewed - No data to display  Imaging Review Dg Ankle Complete Right  12/16/2013    CLINICAL DATA:  Injury to the right ankle oblique a ago complaining of pain in the posterior aspect of the right ankle.  EXAM: RIGHT ANKLE - COMPLETE 3+ VIEW  COMPARISON:  No priors.  FINDINGS: There is no evidence of fracture, dislocation, or joint effusion. There is no evidence of arthropathy or other focal bone abnormality. Soft tissues are unremarkable.  IMPRESSION: Negative.   Electronically Signed   By: Trudie Reedaniel  Entrikin M.D.   On: 12/16/2013 16:30   Dg Knee Complete 4 Views Left  12/16/2013   CLINICAL DATA:  Pain after trauma last week.  EXAM: LEFT KNEE - COMPLETE 4+ VIEW  COMPARISON:  MRI of 01/21/2011 and femur films of 08/14/2008  FINDINGS: No acute fracture or dislocation.  No joint effusion.  IMPRESSION: No acute osseous abnormality.   Electronically Signed   By: Jeronimo GreavesKyle  Talbot M.D.   On: 12/16/2013 16:29     EKG Interpretation None      MDM   Final diagnoses:  Injury of right Achilles tendon  Left knee pain    Clinically concerning for a partial tear to right Achilles tendon. [facility] orthopedic walking boot. Followup with orthopedics. No sports. No heavy lifting. No prolonged walking.  X-ray of left knee is negative.  X-ray of left knee without evidence of effusion. X-ray of right ankle negative. For fracture. But clinically not consistent with an ankle sprain.    Shelda JakesScott W. Kathlene Yano, MD 12/16/13 [phone]

## 2014-03-12 ENCOUNTER — Emergency Department (HOSPITAL_BASED_OUTPATIENT_CLINIC_OR_DEPARTMENT_OTHER): Payer: 59

## 2014-03-12 ENCOUNTER — Other Ambulatory Visit: Payer: Self-pay

## 2014-03-12 ENCOUNTER — Emergency Department (HOSPITAL_BASED_OUTPATIENT_CLINIC_OR_DEPARTMENT_OTHER)
Admission: EM | Admit: 2014-03-12 | Discharge: 2014-03-12 | Disposition: A | Discharge status: Home / Self Care | Payer: 59 | Attending: Emergency Medicine | Admitting: Emergency Medicine

## 2014-03-12 ENCOUNTER — Encounter (HOSPITAL_BASED_OUTPATIENT_CLINIC_OR_DEPARTMENT_OTHER): Payer: Self-pay | Admitting: Emergency Medicine

## 2014-03-12 DIAGNOSIS — J029 Acute pharyngitis, unspecified: Secondary | ICD-10-CM | POA: Insufficient documentation

## 2014-03-12 DIAGNOSIS — Z8669 Personal history of other diseases of the nervous system and sense organs: Secondary | ICD-10-CM | POA: Insufficient documentation

## 2014-03-12 DIAGNOSIS — R51 Headache: Secondary | ICD-10-CM | POA: Insufficient documentation

## 2014-03-12 DIAGNOSIS — IMO0002 Reserved for concepts with insufficient information to code with codable children: Secondary | ICD-10-CM | POA: Insufficient documentation

## 2014-03-12 DIAGNOSIS — R5383 Other fatigue: Secondary | ICD-10-CM | POA: Insufficient documentation

## 2014-03-12 DIAGNOSIS — R011 Cardiac murmur, unspecified: Secondary | ICD-10-CM | POA: Insufficient documentation

## 2014-03-12 DIAGNOSIS — Z79899 Other long term (current) drug therapy: Secondary | ICD-10-CM | POA: Insufficient documentation

## 2014-03-12 DIAGNOSIS — R0602 Shortness of breath: Secondary | ICD-10-CM | POA: Insufficient documentation

## 2014-03-12 DIAGNOSIS — R0789 Other chest pain: Secondary | ICD-10-CM

## 2014-03-12 DIAGNOSIS — R202 Paresthesia of skin: Secondary | ICD-10-CM

## 2014-03-12 DIAGNOSIS — I10 Essential (primary) hypertension: Secondary | ICD-10-CM | POA: Insufficient documentation

## 2014-03-12 DIAGNOSIS — R5381 Other malaise: Secondary | ICD-10-CM | POA: Insufficient documentation

## 2014-03-12 DIAGNOSIS — K0889 Other specified disorders of teeth and supporting structures: Secondary | ICD-10-CM

## 2014-03-12 DIAGNOSIS — R209 Unspecified disturbances of skin sensation: Secondary | ICD-10-CM | POA: Insufficient documentation

## 2014-03-12 DIAGNOSIS — K089 Disorder of teeth and supporting structures, unspecified: Secondary | ICD-10-CM | POA: Insufficient documentation

## 2014-03-12 LAB — COMPREHENSIVE METABOLIC PANEL
ALBUMIN: 4.6 g/dL (ref 3.5–5.2)
ALT: 29 U/L (ref 0–53)
ANION GAP: 14 (ref 5–15)
AST: 18 U/L (ref 0–37)
Alkaline Phosphatase: 49 U/L (ref 39–117)
BUN: 15 mg/dL (ref 6–23)
CALCIUM: 9.9 mg/dL (ref 8.4–10.5)
CO2: 27 mEq/L (ref 19–32)
Chloride: 97 mEq/L (ref 96–112)
Creatinine, Ser: 1.2 mg/dL (ref 0.50–1.35)
GFR calc Af Amer: 86 mL/min — ABNORMAL LOW (ref >90)
GFR calc non Af Amer: 74 mL/min — ABNORMAL LOW (ref >90)
Glucose, Bld: 130 mg/dL — ABNORMAL HIGH (ref 70–99)
Potassium: 3.9 mEq/L (ref 3.7–5.3)
SODIUM: 138 meq/L (ref 137–147)
TOTAL PROTEIN: 7.9 g/dL (ref 6.0–8.3)
Total Bilirubin: 0.3 mg/dL (ref 0.3–1.2)

## 2014-03-12 LAB — CBC WITH DIFFERENTIAL/PLATELET
Basophils Absolute: 0 10*3/uL (ref 0.0–0.1)
Basophils Relative: 0 % (ref 0–1)
EOS ABS: 0.1 10*3/uL (ref 0.0–0.7)
Eosinophils Relative: 1 % (ref 0–5)
HCT: 42.1 % (ref 39.0–52.0)
HEMOGLOBIN: 14.4 g/dL (ref 13.0–17.0)
LYMPHS ABS: 1.4 10*3/uL (ref 0.7–4.0)
LYMPHS PCT: 21 % (ref 12–46)
MCH: 26.9 pg (ref 26.0–34.0)
MCHC: 34.2 g/dL (ref 30.0–36.0)
MCV: 78.5 fL (ref 78.0–100.0)
Monocytes Absolute: 0.4 10*3/uL (ref 0.1–1.0)
Monocytes Relative: 6 % (ref 3–12)
NEUTROS ABS: 4.8 10*3/uL (ref 1.7–7.7)
NEUTROS PCT: 72 % (ref 43–77)
PLATELETS: 249 10*3/uL (ref 150–400)
RBC: 5.36 MIL/uL (ref 4.22–5.81)
RDW: 13.5 % (ref 11.5–15.5)
WBC: 6.7 10*3/uL (ref 4.0–10.5)

## 2014-03-12 LAB — TROPONIN I

## 2014-03-12 MED ORDER — ACYCLOVIR 200 MG PO CAPS: 1000.0000 mg | ORAL_CAPSULE | Freq: Three times a day (TID) | ORAL | Status: DC

## 2014-03-12 MED ORDER — PENICILLIN V POTASSIUM 500 MG PO TABS: 500.0000 mg | ORAL_TABLET | Freq: Three times a day (TID) | ORAL | Status: DC

## 2014-03-12 MED ORDER — PREDNISONE 20 MG PO TABS: 60.0000 mg | ORAL_TABLET | Freq: Every day | ORAL | Status: DC

## 2014-03-12 NOTE — ED Notes (Addendum)
Pt reports chest pain that started today at work, denies sob, n/v, does report swelling to right side of face that has been there for several days, pt is slow to talk, c/o headache to right side, along with jaw pain, bilateral equal grips, neuro intact

## 2014-03-12 NOTE — ED Notes (Signed)
MD at bedside. 

## 2014-03-12 NOTE — ED Provider Notes (Signed)
CSN: [id number]     Arrival date & time 03/12/14  1948 History  This chart was scribed for Ruben CiscoMegan E Jaaziah Schulke, MD by Quintella ReichertMatthew Underwood, ED scribe.  This patient was seen in room MH06/MH06 and the patient's care was started at 8:24 PM.   Chief Complaint  Patient presents with  . Chest Pain    Patient is a [age] y.o. male presenting with chest pain. The history is provided by the patient and the spouse. No language interpreter was used.  Chest Pain Chest pain location: central. Pain quality: throbbing   Pain radiates to:  Does not radiate Pain radiates to the back: no   Pain severity:  Moderate Duration:  7 hours Timing:  Intermittent Relieved by: "relaxing" Associated symptoms: fatigue, headache and shortness of breath (mild)   Associated symptoms: no abdominal pain, no back pain, no cough, no diaphoresis, no dizziness, no dysphagia, no fever, no nausea, no numbness, not vomiting and no weakness   Risk factors: hypertension     HPI Comments: Ruben PiggCharles L Silva is a 43 y.o. male with h/o HTN, bell's palsy, and heart murmur who presents to the Emergency Department complaining of CP that began 7 hours ago with associated facial swelling, headache and fatigue.  Wife states that pt has been complaining of chest pain and headache and stating "he wants to go to sleep," which concerns her because he typically has a lot of energy (pt states "I'm like a bunny rabbit") and due to his h/o HTN and bell's palsy.  Pt localizes pain to the center of his chest.  He describes it as an intermittent throbbing pain at a severity of 7/10 at its worst, and 6/10 currently.  It does not radiate.  It occurs in 20-minute episodes several times per hour and is improved by "relaxing."  He reports mild associated SOB.  He denies associated nausea, vomiting, or diaphoresis.  Pt admits to prior h/o similar chest pain when he had Bell's palsy 3 years ago.  Pt also reports right-sided facial swelling that began today.  He states he  also had facial swelling when he had Bell's palsy.  He denies facial numbness or tingling.  In addition he complains of a headache with pain to the right side of his forehead and right cheek to the ear.  Headache is described as "it makes me want to go to sleep."  He admits to prior h/o similar headaches occasionally for at least the past several months, and states he gets a headache about once per week.  They are localized to various areas.  He reports that he has felt fatigued for the past several days.  He states his throat is mildly sore and feels slightly swollen.  He also notes some bilateral dental pain for the past few days.  He denies leg pain or swelling, fever, congestion, rhinorrhea, nausea, vomiting, diarrhea, dysuria, or visual changes.  Pt has been on generic Avalide for his HTN since his episode of Bell's palsy.  He denies any other chronic medical conditions.  He denies recent sick contacts or antibiotic use.   Past Medical History  Diagnosis Date  . Murmur   . Hypertension   . Bell's palsy     History reviewed. No pertinent past surgical history.   Family History  Problem Relation Age of Onset  . Diabetes Other   . Hypertension Other     History  Substance Use Topics  . Smoking status: Never Smoker   . Smokeless tobacco:  Not on file  . Alcohol Use: No     Review of Systems  Constitutional: Positive for fatigue. Negative for fever and diaphoresis.  HENT: Positive for dental problem, facial swelling and sore throat. Negative for congestion, rhinorrhea and trouble swallowing.   Eyes: Negative for photophobia, pain and visual disturbance.  Respiratory: Positive for shortness of breath (mild). Negative for cough and chest tightness.   Cardiovascular: Positive for chest pain. Negative for leg swelling.  Gastrointestinal: Negative for nausea, vomiting, abdominal pain, diarrhea and constipation.  Endocrine: Negative for polydipsia and polyuria.  Genitourinary: Negative  for dysuria, urgency, decreased urine volume and difficulty urinating.  Musculoskeletal: Negative for back pain and gait problem.  Skin: Negative for color change, rash and wound.  Allergic/Immunologic: Negative for immunocompromised state.  Neurological: Positive for headaches. Negative for dizziness, facial asymmetry, speech difficulty, weakness and numbness.  Psychiatric/Behavioral: Negative for confusion, decreased concentration and agitation.      Allergies  Review of patient's allergies indicates no known allergies.  Home Medications   Prior to Admission medications   Medication Sig Start Date End Date Taking? Authorizing Provider  irbesartan-hydrochlorothiazide (AVALIDE) 150-12.5 MG per tablet Take 1 tablet by mouth daily. 12/16/13  Yes Vanetta Mulders, MD  acyclovir (ZOVIRAX) 200 MG capsule Take 5 capsules (1,000 mg total) by mouth 3 (three) times daily. For 1 week 03/12/14   Ruben Cisco, MD  ibuprofen (ADVIL,MOTRIN) 200 MG tablet Take 400 mg by mouth every 6 (six) hours as needed for mild pain.    Historical Provider, MD  irbesartan-hydrochlorothiazide (AVALIDE) 150-12.5 MG per tablet Take 1 tablet by mouth daily.    Historical Provider, MD  naproxen (NAPROSYN) 500 MG tablet Take 1 tablet (500 mg total) by mouth 2 (two) times daily. 12/16/13   Vanetta Mulders, MD  penicillin v potassium (VEETID) 500 MG tablet Take 1 tablet (500 mg total) by mouth 3 (three) times daily. 03/12/14   Ruben Cisco, MD  predniSONE (DELTASONE) 20 MG tablet Take 3 tablets (60 mg total) by mouth daily with breakfast. For 1 week 03/12/14   Ruben Cisco, MD   BP 170/88  Pulse 86  Resp 18  Ht 5\' 8"  (1.727 m)  Wt 230 lb (104.327 kg)  BMI 34.98 kg/m2  SpO2 99%  Physical Exam  Nursing note and vitals reviewed. Constitutional: He is oriented to person, place, and time. He appears well-developed and well-nourished. No distress.  HENT:  Head: Normocephalic and atraumatic.  Mouth/Throat: No  oropharyngeal exudate.    Tenderness w/ percussion to marked teeth. No swelling of face appreciated.  Eyes: Pupils are equal, round, and reactive to light.  Neck: Normal range of motion. Neck supple.  Cardiovascular: Normal rate, regular rhythm and normal heart sounds.  Exam reveals no gallop and no friction rub.   No murmur heard. Pulmonary/Chest: Effort normal and breath sounds normal. No respiratory distress. He has no wheezes. He has no rales. He exhibits tenderness.  Reproducible midsternal chest pain.  Abdominal: Soft. Bowel sounds are normal. He exhibits no distension and no mass. There is no tenderness. There is no rebound and no guarding.  Musculoskeletal: Normal range of motion. He exhibits no edema and no tenderness.  Neurological: He is alert and oriented to person, place, and time. A sensory deficit is present. No cranial nerve deficit. GCS eye subscore is 4. GCS verbal subscore is 5. GCS motor subscore is 6.  Feels like V2 distribution is "puffy" with fine touch.  Skin: Skin is warm  and dry.  Psychiatric: He has a normal mood and affect.    ED Course  Procedures (including critical care time)  DIAGNOSTIC STUDIES: Oxygen Saturation is 99% on room air, normal by my interpretation.    COORDINATION OF CARE: 8:43 PM-Discussed treatment plan which includes head CT, CXR, and labs with pt at bedside and pt agreed to plan.     Labs Review Labs Reviewed  COMPREHENSIVE METABOLIC PANEL - Abnormal; Notable for the following:    Glucose, Bld 130 (*)    GFR calc non Af Amer 74 (*)    GFR calc Af Amer 86 (*)    All other components within normal limits  TROPONIN I  CBC WITH DIFFERENTIAL    Imaging Review Dg Chest 2 View  03/12/2014   CLINICAL DATA:  Chest pain  EXAM: CHEST  2 VIEW  COMPARISON:  08/26/2010  FINDINGS: Normal heart size and mediastinal contours. No acute infiltrate or edema. No effusion or pneumothorax. No acute osseous findings.  IMPRESSION: No active  cardiopulmonary disease.   Electronically Signed   By: Tiburcio Pea M.D.   On: 03/12/2014 21:23   Ct Head Wo Contrast  03/12/2014   CLINICAL DATA:  Headache, right-sided facial paresthesia.  EXAM: CT HEAD WITHOUT CONTRAST  TECHNIQUE: Contiguous axial images were obtained from the base of the skull through the vertex without intravenous contrast.  COMPARISON:  CT scan of December 18, 2007.  FINDINGS: Bony calvarium appears intact. No mass effect or midline shift is noted. Ventricular size is within normal limits. There is no evidence of mass lesion, hemorrhage or acute infarction.  IMPRESSION: Normal head CT.   Electronically Signed   By: Roque Lias M.D.   On: 03/12/2014 21:38     EKG Interpretation None      MDM   Final diagnoses:  Atypical chest pain  Facial paresthesia  Pain, dental    Pt is a 43 y.o. male with Pmhx as above who presents with about 8 hrs of paresthesias of R face, sensation or R facial swelling, dental pain R sided h/a and central chest pain. Symptoms similar to prior episode of Bell's Palsy.  On PE, VSS, pt in NAD. I cannot appreciate facial swelling or dental changes though he reports dental pain as above, as well as dec sensation to R face in V2 distrubution. +ttp central chest wall. EKG w/o ischemic changes. CT head nml. CXR nml. I am unsure if facial symptoms are reoccurrence of Bell's palsy or early dental abscess with facial cellulitis. Will treat for both with pred/acyclovir as well as pen VK.  I doubt CVA/TIA, and also doubt ACS as CP atypical, reproducible, with nml trop, EKG, CXR. Return precautions given for new or worsening symptoms including worsening pain, further neuro symptoms.     I personally performed the services described in this documentation, which was scribed in my presence. The recorded information has been reviewed and is accurate.    Ruben Cisco, MD 03/13/14 0040

## 2014-03-12 NOTE — Discharge Instructions (Signed)
Bell's Palsy Bell's palsy is a condition in which the muscles on one side of the face cannot move (paralysis). This is because the nerves in the face are paralyzed. It is most often thought to be caused by a virus. The virus causes swelling of the nerve that controls movement on one side of the face. The nerve travels through a tight space surrounded by bone. When the nerve swells, it can be compressed by the bone. This results in damage to the protective covering around the nerve. This damage interferes with how the nerve communicates with the muscles of the face. As a result, it can cause weakness or paralysis of the facial muscles.  Injury (trauma), tumor, and surgery may cause Bell's palsy, but most of the time the cause is unknown. It is a relatively common condition. It starts suddenly (abrupt onset) with the paralysis usually ending within 2 days. Bell's palsy is not dangerous. But because the eye does not close properly, you may need care to keep the eye from getting dry. This can include splinting (to keep the eye shut) or moistening with artificial tears. Bell's palsy very seldom occurs on both sides of the face at the same time. SYMPTOMS   Eyebrow sagging.  Drooping of the eyelid and corner of the mouth.  Inability to close one eye.  Loss of taste on the front of the tongue.  Sensitivity to loud noises. TREATMENT  The treatment is usually non-surgical. If the patient is seen within the first 24 to 48 hours, a short course of steroids may be prescribed, in an attempt to shorten the length of the condition. Antiviral medicines may also be used with the steroids, but it is unclear if they are helpful.  You will need to protect your eye, if you cannot close it. The cornea (clear covering over your eye) will become dry and can be damaged. Artificial tears can be used to keep your eye moist. Glasses or an eye patch should be worn to protect your eye. PROGNOSIS  Recovery is variable, ranging  from days to months. Although the problem usually goes away completely (about 80% of cases resolve), predicting the outcome is impossible. Most people improve within 3 weeks of when the symptoms began. Improvement may continue for 3 to 6 months. A small number of people have moderate to severe weakness that is permanent.  HOME CARE INSTRUCTIONS   If your caregiver prescribed medication to reduce swelling in the nerve, use as directed. Do not stop taking the medication unless directed by your caregiver.  Use moisturizing eye drops as needed to prevent drying of your eye, as directed by your caregiver.  Protect your eye, as directed by your caregiver.  Use facial massage and exercises, as directed by your caregiver.  Perform your normal activities, and get your normal rest. SEEK IMMEDIATE MEDICAL CARE IF:   There is pain, redness or irritation in the eye.  You or your child has an oral temperature above 102 F (38.9 C), not controlled by medicine. MAKE SURE YOU:   Understand these instructions.  Will watch your condition.  Will get help right away if you are not doing well or get worse. Document Released: 08/17/2005 Document Revised: 11/09/2011 Document Reviewed: 08/26/2009 [facility] Patient Information 2015 [place], [place]. This information is not intended to replace advice given to you by your health care provider. Make sure you discuss any questions you have with your health care provider.   Headaches, Frequently Asked Questions MIGRAINE HEADACHES Q:  What is migraine? What causes it? How can I treat it? A: Generally, migraine headaches begin as a dull ache. Then they develop into a constant, throbbing, and pulsating pain. You may experience pain at the temples. You may experience pain at the front or back of one or both sides of the head. The pain is usually accompanied by a combination of:  Nausea.  Vomiting.  Sensitivity to light and noise. Some people (about 15%) experience  an aura (see below) before an attack. The cause of migraine is believed to be chemical reactions in the brain. Treatment for migraine may include over-the-counter or prescription medications. It may also include self-help techniques. These include relaxation training and biofeedback.  Q: What is an aura? A: About 15% of people with migraine get an "aura". This is a sign of neurological symptoms that occur before a migraine headache. You may see wavy or jagged lines, dots, or flashing lights. You might experience tunnel vision or blind spots in one or both eyes. The aura can include visual or auditory hallucinations (something imagined). It may include disruptions in smell (such as strange odors), taste or touch. Other symptoms include:  Numbness.  A "pins and needles" sensation.  Difficulty in recalling or speaking the correct word. These neurological events may last as long as 60 minutes. These symptoms will fade as the headache begins. Q: What is a trigger? A: Certain physical or environmental factors can lead to or "trigger" a migraine. These include:  Foods.  Hormonal changes.  Weather.  Stress. It is important to remember that triggers are different for everyone. To help prevent migraine attacks, you need to figure out which triggers affect you. Keep a headache diary. This is a good way to track triggers. The diary will help you talk to your healthcare professional about your condition. Q: Does weather affect migraines? A: Bright sunshine, hot, humid conditions, and drastic changes in barometric pressure may lead to, or "trigger," a migraine attack in some people. But studies have shown that weather does not act as a trigger for everyone with migraines. Q: What is the link between migraine and hormones? A: Hormones start and regulate many of your body's functions. Hormones keep your body in balance within a constantly changing environment. The levels of hormones in your body are  unbalanced at times. Examples are during menstruation, pregnancy, or menopause. That can lead to a migraine attack. In fact, about three quarters of all women with migraine report that their attacks are related to the menstrual cycle.  Q: Is there an increased risk of stroke for migraine sufferers? A: The likelihood of a migraine attack causing a stroke is very remote. That is not to say that migraine sufferers cannot have a stroke associated with their migraines. In persons under age 48, the most common associated factor for stroke is migraine headache. But over the course of a person's normal life span, the occurrence of migraine headache may actually be associated with a reduced risk of dying from cerebrovascular disease due to stroke.  Q: What are acute medications for migraine? A: Acute medications are used to treat the pain of the headache after it has started. Examples over-the-counter medications, NSAIDs, ergots, and triptans.  Q: What are the triptans? A: Triptans are the newest class of abortive medications. They are specifically targeted to treat migraine. Triptans are vasoconstrictors. They moderate some chemical reactions in the brain. The triptans work on receptors in your brain. Triptans help to restore the balance of a  neurotransmitter called serotonin. Fluctuations in levels of serotonin are thought to be a main cause of migraine.  Q: Are over-the-counter medications for migraine effective? A: Over-the-counter, or "OTC," medications may be effective in relieving mild to moderate pain and associated symptoms of migraine. But you should see your caregiver before beginning any treatment regimen for migraine.  Q: What are preventive medications for migraine? A: Preventive medications for migraine are sometimes referred to as "prophylactic" treatments. They are used to reduce the frequency, severity, and length of migraine attacks. Examples of preventive medications include antiepileptic  medications, antidepressants, beta-blockers, calcium channel blockers, and NSAIDs (nonsteroidal anti-inflammatory drugs). Q: Why are anticonvulsants used to treat migraine? A: During the past few years, there has been an increased interest in antiepileptic drugs for the prevention of migraine. They are sometimes referred to as "anticonvulsants". Both epilepsy and migraine may be caused by similar reactions in the brain.  Q: Why are antidepressants used to treat migraine? A: Antidepressants are typically used to treat people with depression. They may reduce migraine frequency by regulating chemical levels, such as serotonin, in the brain.  Q: What alternative therapies are used to treat migraine? A: The term "alternative therapies" is often used to describe treatments considered outside the scope of conventional Western medicine. Examples of alternative therapy include acupuncture, acupressure, and yoga. Another common alternative treatment is herbal therapy. Some herbs are believed to relieve headache pain. Always discuss alternative therapies with your caregiver before proceeding. Some herbal products contain arsenic and other toxins. TENSION HEADACHES Q: What is a tension-type headache? What causes it? How can I treat it? A: Tension-type headaches occur randomly. They are often the result of temporary stress, anxiety, fatigue, or anger. Symptoms include soreness in your temples, a tightening band-like sensation around your head (a "vice-like" ache). Symptoms can also include a pulling feeling, pressure sensations, and contracting head and neck muscles. The headache begins in your forehead, temples, or the back of your head and neck. Treatment for tension-type headache may include over-the-counter or prescription medications. Treatment may also include self-help techniques such as relaxation training and biofeedback. CLUSTER HEADACHES Q: What is a cluster headache? What causes it? How can I treat it? A:  Cluster headache gets its name because the attacks come in groups. The pain arrives with little, if any, warning. It is usually on one side of the head. A tearing or bloodshot eye and a runny nose on the same side of the headache may also accompany the pain. Cluster headaches are believed to be caused by chemical reactions in the brain. They have been described as the most severe and intense of any headache type. Treatment for cluster headache includes prescription medication and oxygen. SINUS HEADACHES Q: What is a sinus headache? What causes it? How can I treat it? A: When a cavity in the bones of the face and skull (a sinus) becomes inflamed, the inflammation will cause localized pain. This condition is usually the result of an allergic reaction, a tumor, or an infection. If your headache is caused by a sinus blockage, such as an infection, you will probably have a fever. An x-ray will confirm a sinus blockage. Your caregiver's treatment might include antibiotics for the infection, as well as antihistamines or decongestants.  REBOUND HEADACHES Q: What is a rebound headache? What causes it? How can I treat it? A: A pattern of taking acute headache medications too often can lead to a condition known as "rebound headache." A pattern of taking too much  headache medication includes taking it more than 2 days per week or in excessive amounts. That means more than the label or a caregiver advises. With rebound headaches, your medications not only stop relieving pain, they actually begin to cause headaches. Doctors treat rebound headache by tapering the medication that is being overused. Sometimes your caregiver will gradually substitute a different type of treatment or medication. Stopping may be a challenge. Regularly overusing a medication increases the potential for serious side effects. Consult a caregiver if you regularly use headache medications more than 2 days per week or more than the label  advises. ADDITIONAL QUESTIONS AND ANSWERS Q: What is biofeedback? A: Biofeedback is a self-help treatment. Biofeedback uses special equipment to monitor your body's involuntary physical responses. Biofeedback monitors:  Breathing.  Pulse.  Heart rate.  Temperature.  Muscle tension.  Brain activity. Biofeedback helps you refine and perfect your relaxation exercises. You learn to control the physical responses that are related to stress. Once the technique has been mastered, you do not need the equipment any more. Q: Are headaches hereditary? A: Four out of five (80%) of people that suffer report a family history of migraine. Scientists are not sure if this is genetic or a family predisposition. Despite the uncertainty, a child has a 50% chance of having migraine if one parent suffers. The child has a 75% chance if both parents suffer.  Q: Can children get headaches? A: By the time they reach high school, most young people have experienced some type of headache. Many safe and effective approaches or medications can prevent a headache from occurring or stop it after it has begun.  Q: What type of doctor should I see to diagnose and treat my headache? A: Start with your primary caregiver. Discuss his or her experience and approach to headaches. Discuss methods of classification, diagnosis, and treatment. Your caregiver may decide to recommend you to a headache specialist, depending upon your symptoms or other physical conditions. Having diabetes, allergies, etc., may require a more comprehensive and inclusive approach to your headache. The National Head[employer] Foundation will provide, upon request, a list of [facility] physician members in your state. Document Released: 11/07/2003 Document Revised: 11/09/2011 Document Reviewed: 04/16/2008 [facility] Patient Information 2015 [place], [place]. This information is not intended to replace advice given to you by your health care provider. Make sure you discuss any  questions you have with your health care provider.   Chest Pain (Nonspecific) It is often hard to give a specific diagnosis for the cause of chest pain. There is always a chance that your pain could be related to something serious, such as a heart attack or a blood clot in the lungs. You need to follow up with your health care provider for further evaluation. CAUSES   Heartburn.  Pneumonia or bronchitis.  Anxiety or stress.  Inflammation around your heart (pericarditis) or lung (pleuritis or pleurisy).  A blood clot in the lung.  A collapsed lung (pneumothorax). It can develop suddenly on its own (spontaneous pneumothorax) or from trauma to the chest.  Shingles infection (herpes zoster virus). The chest wall is composed of bones, muscles, and cartilage. Any of these can be the source of the pain.  The bones can be bruised by injury.  The muscles or cartilage can be strained by coughing or overwork.  The cartilage can be affected by inflammation and become sore (costochondritis). DIAGNOSIS  Lab tests or other studies may be needed to find the cause of your pain. Your  health care provider may have you take a test called an ambulatory electrocardiogram (ECG). An ECG records your heartbeat patterns over a 24-hour period. You may also have other tests, such as:  Transthoracic echocardiogram (TTE). During echocardiography, sound waves are used to evaluate how blood flows through your heart.  Transesophageal echocardiogram (TEE).  Cardiac monitoring. This allows your health care provider to monitor your heart rate and rhythm in real time.  Holter monitor. This is a portable device that records your heartbeat and can help diagnose heart arrhythmias. It allows your health care provider to track your heart activity for several days, if needed.  Stress tests by exercise or by giving medicine that makes the heart beat faster. TREATMENT   Treatment depends on what may be causing your  chest pain. Treatment may include:  Acid blockers for heartburn.  Anti-inflammatory medicine.  Pain medicine for inflammatory conditions.  Antibiotics if an infection is present.  You may be advised to change lifestyle habits. This includes stopping smoking and avoiding alcohol, caffeine, and chocolate.  You may be advised to keep your head raised (elevated) when sleeping. This reduces the chance of acid going backward from your stomach into your esophagus. Most of the time, nonspecific chest pain will improve within 2-3 days with rest and mild pain medicine.  HOME CARE INSTRUCTIONS   If antibiotics were prescribed, take them as directed. Finish them even if you start to feel better.  For the next few days, avoid physical activities that bring on chest pain. Continue physical activities as directed.  Do not use any tobacco products, including cigarettes, chewing tobacco, or electronic cigarettes.  Avoid drinking alcohol.  Only take medicine as directed by your health care provider.  Follow your health care provider's suggestions for further testing if your chest pain does not go away.  Keep any follow-up appointments you made. If you do not go to an appointment, you could develop lasting (chronic) problems with pain. If there is any problem keeping an appointment, call to reschedule. SEEK MEDICAL CARE IF:   Your chest pain does not go away, even after treatment.  You have a rash with blisters on your chest.  You have a fever. SEEK IMMEDIATE MEDICAL CARE IF:   You have increased chest pain or pain that spreads to your arm, neck, jaw, back, or abdomen.  You have shortness of breath.  You have an increasing cough, or you cough up blood.  You have severe back or abdominal pain.  You feel nauseous or vomit.  You have severe weakness.  You faint.  You have chills. This is an emergency. Do not wait to see if the pain will go away. Get medical help at once. Call your  local emergency services (911 in [place] Do not drive yourself to the hospital. MAKE SURE YOU:   Understand these instructions.  Will watch your condition.  Will get help right away if you are not doing well or get worse. Document Released: 05/27/2005 Document Revised: 08/22/2013 Document Reviewed: 03/22/2008 [facility] Patient Information 2015 [place], [place]. This information is not intended to replace advice given to you by your health care provider. Make sure you discuss any questions you have with your health care provider.

## 2014-05-14 ENCOUNTER — Emergency Department (HOSPITAL_BASED_OUTPATIENT_CLINIC_OR_DEPARTMENT_OTHER)
Admission: EM | Admit: 2014-05-14 | Discharge: 2014-05-14 | Disposition: A | Discharge status: Home / Self Care | Payer: 59 | Attending: Emergency Medicine | Admitting: Emergency Medicine

## 2014-05-14 ENCOUNTER — Encounter (HOSPITAL_BASED_OUTPATIENT_CLINIC_OR_DEPARTMENT_OTHER): Payer: Self-pay | Admitting: Emergency Medicine

## 2014-05-14 DIAGNOSIS — I10 Essential (primary) hypertension: Secondary | ICD-10-CM | POA: Insufficient documentation

## 2014-05-14 DIAGNOSIS — Z79899 Other long term (current) drug therapy: Secondary | ICD-10-CM | POA: Insufficient documentation

## 2014-05-14 DIAGNOSIS — Z792 Long term (current) use of antibiotics: Secondary | ICD-10-CM | POA: Insufficient documentation

## 2014-05-14 DIAGNOSIS — Z760 Encounter for issue of repeat prescription: Secondary | ICD-10-CM | POA: Insufficient documentation

## 2014-05-14 DIAGNOSIS — K089 Disorder of teeth and supporting structures, unspecified: Secondary | ICD-10-CM | POA: Insufficient documentation

## 2014-05-14 DIAGNOSIS — K0889 Other specified disorders of teeth and supporting structures: Secondary | ICD-10-CM

## 2014-05-14 DIAGNOSIS — Z8669 Personal history of other diseases of the nervous system and sense organs: Secondary | ICD-10-CM | POA: Insufficient documentation

## 2014-05-14 DIAGNOSIS — R011 Cardiac murmur, unspecified: Secondary | ICD-10-CM | POA: Insufficient documentation

## 2014-05-14 DIAGNOSIS — IMO0002 Reserved for concepts with insufficient information to code with codable children: Secondary | ICD-10-CM | POA: Insufficient documentation

## 2014-05-14 MED ORDER — AMOXICILLIN 500 MG PO CAPS: 500.0000 mg | ORAL_CAPSULE | Freq: Four times a day (QID) | ORAL | Status: DC

## 2014-05-14 NOTE — Discharge Instructions (Signed)
Return to the ED with any concerns including facial swelling, difficulty breathing or swallowing, fever/chills, or any other alarming symptoms

## 2014-05-14 NOTE — ED Notes (Signed)
Pt amb to room 12 with quick steady gait in nad. Pt reports his dentist told him he needed a refill on his amoxicillin, but told him to come here since we were the ones who originally prescribed it.

## 2014-05-14 NOTE — ED Notes (Signed)
MD at bedside. 

## 2014-05-14 NOTE — ED Provider Notes (Signed)
CSN: [id number]     Arrival date & time 05/14/14  1634 History   First MD Initiated Contact with Patient 05/14/14 1648     Chief Complaint  Patient presents with  . Medication Refill     (Consider location/radiation/quality/duration/timing/severity/associated sxs/prior Treatment) HPI Pt presents with c/o requesting refill on amoxicillin. He states after his ED visit in July, he was placed on penicillin which did not help with his dental pain.  He states he saw a dentist who prescribed amoxicillin.  He states the amoxicillin helped his dental pain, no longer having swollen nodes in neck or ear pain.  He has since made an appointment with another dentist because the first dentist was more expensive.  When he called the first dentist who had prescribed the medication he was told to go the ED for a refill since he was no longer their patient, but that he needed to continue on the amoxicillin until he follows up with the new dentist next week.  Currently patient has no active symptoms.  Dental pain is decreased.  Pt states he has appointment September 24 with new dentist.    Past Medical History  Diagnosis Date  . Murmur   . Hypertension   . Bell's palsy    History reviewed. No pertinent past surgical history. Family History  Problem Relation Age of Onset  . Diabetes Other   . Hypertension Other    History  Substance Use Topics  . Smoking status: Never Smoker   . Smokeless tobacco: Not on file  . Alcohol Use: No    Review of Systems ROS reviewed and all otherwise negative except for mentioned in HPI    Allergies  Review of patient's allergies indicates no known allergies.  Home Medications   Prior to Admission medications   Medication Sig Start Date End Date Taking? Authorizing Provider  acyclovir (ZOVIRAX) 200 MG capsule Take 5 capsules (1,000 mg total) by mouth 3 (three) times daily. For 1 week 03/12/14   Toy Cookey, MD  amoxicillin (AMOXIL) 500 MG capsule Take 1 capsule  (500 mg total) by mouth 4 (four) times daily. 05/14/14   Ethelda Chick, MD  ibuprofen (ADVIL,MOTRIN) 200 MG tablet Take 400 mg by mouth every 6 (six) hours as needed for mild pain.    Historical Provider, MD  irbesartan-hydrochlorothiazide (AVALIDE) 150-12.5 MG per tablet Take 1 tablet by mouth daily.    Historical Provider, MD  irbesartan-hydrochlorothiazide (AVALIDE) 150-12.5 MG per tablet Take 1 tablet by mouth daily. 12/16/13   Vanetta Mulders, MD  naproxen (NAPROSYN) 500 MG tablet Take 1 tablet (500 mg total) by mouth 2 (two) times daily. 12/16/13   Vanetta Mulders, MD  penicillin v potassium (VEETID) 500 MG tablet Take 1 tablet (500 mg total) by mouth 3 (three) times daily. 03/12/14   Toy Cookey, MD  predniSONE (DELTASONE) 20 MG tablet Take 3 tablets (60 mg total) by mouth daily with breakfast. For 1 week 03/12/14   Toy Cookey, MD   BP 141/80  Pulse 68  Temp(Src) 98.4 F (36.9 C) (Oral)  Resp 18  Ht 5' 8.75" (1.746 m)  Wt 230 lb (104.327 kg)  BMI 34.22 kg/m2  SpO2 100% Vitals reviewed Physical Exam Physical Examination: General appearance - alert, well appearing, and in no distress Mental status - alert, oriented to person, place, and time Eyes - no conjunctival injection, no scleral icterus Mouth - mucous membranes moist, pharynx normal without lesions, no significant dental decay, no gingival abscess, no facial swelling  Neck - supple, no significant adenopathy Extremities - peripheral pulses normal, no pedal edema, no clubbing or cyanosis Skin - normal coloration and turgor, no rashes  ED Course  Procedures (including critical care time) Labs Review Labs Reviewed - No data to display  Imaging Review No results found.   EKG Interpretation None      MDM   Final diagnoses:  Pain, dental    Pt presenting with c/o requesting refill of amoxicillin.  Pt was given enough to last until his dental appointment on the 24th.  On exam he has no signs or symptoms of acute  illness.  Discharged with strict return precautions.  Pt agreeable with plan.    Ethelda Chick, MD 05/14/14 [phone]

## 2014-08-08 ENCOUNTER — Encounter (HOSPITAL_BASED_OUTPATIENT_CLINIC_OR_DEPARTMENT_OTHER): Payer: Self-pay | Admitting: Emergency Medicine

## 2014-08-08 ENCOUNTER — Emergency Department (HOSPITAL_BASED_OUTPATIENT_CLINIC_OR_DEPARTMENT_OTHER)
Admission: EM | Admit: 2014-08-08 | Discharge: 2014-08-08 | Disposition: A | Discharge status: Home / Self Care | Payer: 59 | Attending: Emergency Medicine | Admitting: Emergency Medicine

## 2014-08-08 DIAGNOSIS — Z791 Long term (current) use of non-steroidal anti-inflammatories (NSAID): Secondary | ICD-10-CM | POA: Insufficient documentation

## 2014-08-08 DIAGNOSIS — J029 Acute pharyngitis, unspecified: Secondary | ICD-10-CM

## 2014-08-08 DIAGNOSIS — I10 Essential (primary) hypertension: Secondary | ICD-10-CM | POA: Insufficient documentation

## 2014-08-08 DIAGNOSIS — Z8669 Personal history of other diseases of the nervous system and sense organs: Secondary | ICD-10-CM | POA: Diagnosis not present

## 2014-08-08 DIAGNOSIS — Z79899 Other long term (current) drug therapy: Secondary | ICD-10-CM | POA: Insufficient documentation

## 2014-08-08 DIAGNOSIS — R011 Cardiac murmur, unspecified: Secondary | ICD-10-CM | POA: Diagnosis not present

## 2014-08-08 LAB — RAPID STREP SCREEN (MED CTR MEBANE ONLY): Streptococcus, Group A Screen (Direct): NEGATIVE

## 2014-08-08 MED ORDER — HYDROCODONE-ACETAMINOPHEN 7.5-325 MG/15ML PO SOLN: 15.0000 mL | Freq: Three times a day (TID) | ORAL | Status: DC | PRN

## 2014-08-08 MED ORDER — IBUPROFEN 800 MG PO TABS: 800.0000 mg | ORAL_TABLET | Freq: Three times a day (TID) | ORAL | Status: DC

## 2014-08-08 NOTE — Discharge Instructions (Signed)
Please call your doctor for a followup appointment within 24-48 hours. When you talk to your doctor please let them know that you were seen in the emergency department and have them acquire all of your records so that they can discuss the findings with you and formulate a treatment plan to fully care for your new and ongoing problems. ° °

## 2014-08-08 NOTE — ED Provider Notes (Signed)
CSN: [id number]     Arrival date & time 08/08/14  [id number] History   First MD Initiated Contact with Patient 08/08/14 [phone]     Chief Complaint  Patient presents with  . Sore Throat     (Consider location/radiation/quality/duration/timing/severity/associated sxs/prior Treatment) HPI Comments: The patient is a 43 year old male who has no significant past medical history other than hypertension who presents with a complaint of a sore throat which has been present for approximately 4 days. Gradual in onset, persistent, not associated with coughing he does have some low-grade fevers. He has not taken any medications at home, he states that he took penicillin several months ago for a toothache, he has some more of this medication at home but is adamant that he has not taken any of it. He denies nausea vomiting abdominal pain weakness or fatigue.  Patient is a 43 y.o. male presenting with pharyngitis. The history is provided by the patient.  Sore Throat Pertinent negatives include no headaches.    Past Medical History  Diagnosis Date  . Murmur   . Hypertension   . Bell's palsy    No past surgical history on file. Family History  Problem Relation Age of Onset  . Diabetes Other   . Hypertension Other    History  Substance Use Topics  . Smoking status: Never Smoker   . Smokeless tobacco: Not on file  . Alcohol Use: No    Review of Systems  Constitutional: Positive for fever. Negative for chills.  HENT: Positive for sore throat. Negative for congestion, ear pain and rhinorrhea.   Eyes: Negative for discharge and redness.  Respiratory: Negative for cough.   Gastrointestinal: Negative for nausea, vomiting and diarrhea.  Genitourinary: Negative for dysuria.  Musculoskeletal: Negative for back pain and joint swelling.  Skin: Negative for rash.  Neurological: Negative for headaches.  Hematological: Negative for adenopathy.      Allergies  Review of patient's allergies indicates no  known allergies.  Home Medications   Prior to Admission medications   Medication Sig Start Date End Date Taking? Authorizing Provider  HYDROcodone-acetaminophen (HYCET) 7.5-325 mg/15 ml solution Take 15 mLs by mouth every 8 (eight) hours as needed for moderate pain. 08/08/14   Vida RollerBrian D Emmalyn Hinson, MD  ibuprofen (ADVIL,MOTRIN) 800 MG tablet Take 1 tablet (800 mg total) by mouth 3 (three) times daily. 08/08/14   Vida RollerBrian D Carsin Randazzo, MD  irbesartan-hydrochlorothiazide (AVALIDE) 150-12.5 MG per tablet Take 1 tablet by mouth daily. 12/16/13   Vanetta MuldersScott Zackowski, MD   BP 136/88 mmHg  Pulse 82  Temp(Src) 98.2 F (36.8 C) (Oral)  Resp 16  Ht 5' 8.75" (1.746 m)  Wt 222 lb (100.699 kg)  BMI 33.03 kg/m2  SpO2 99% Physical Exam  Constitutional: He appears well-developed and well-nourished.  HENT:  Head: Normocephalic and atraumatic.  Pharyngeal erythema present, no tonsillar exudate asymmetry or hypertrophy, mucous members are moist, pharynx is patent and phonation is normal.  Eyes: Conjunctivae are normal. Right eye exhibits no discharge. Left eye exhibits no discharge.  Cardiovascular: Normal rate.   Pulmonary/Chest: Effort normal. No respiratory distress.  Neurological: He is alert. Coordination normal.  Skin: Skin is warm and dry. No rash noted. He is not diaphoretic. No erythema.  Psychiatric: He has a normal mood and affect.  Nursing note and vitals reviewed.   ED Course  Procedures (including critical care time) Labs Review Labs Reviewed  RAPID STREP SCREEN  CULTURE, GROUP A STREP    Imaging Review No results found.  MDM   Final diagnoses:  Pharyngitis    Vital signs are normal, patient has a erythematous pharyngitis without signs of exudate, no lymphadenopathy of the neck in a very supple neck. He appears well, check for strep, otherwise anticipate discharge with supportive care. Patient is in agreement with the plan.  Strep neg  Meds given in ED:  Medications - No data to  display  New Prescriptions   HYDROCODONE-ACETAMINOPHEN (HYCET) 7.5-325 MG/15 ML SOLUTION    Take 15 mLs by mouth every 8 (eight) hours as needed for moderate pain.   IBUPROFEN (ADVIL,MOTRIN) 800 MG TABLET    Take 1 tablet (800 mg total) by mouth 3 (three) times daily.      Vida RollerBrian D Zakir Henner, MD 08/08/14 [phone]

## 2014-08-08 NOTE — ED Notes (Signed)
Pt having sore throat for 3-4 days.  Some fever.  Difficulty swallowing.

## 2014-08-10 LAB — CULTURE, GROUP A STREP

## 2014-09-06 ENCOUNTER — Other Ambulatory Visit: Payer: Self-pay | Admitting: Orthopedic Surgery

## 2014-09-06 DIAGNOSIS — R609 Edema, unspecified: Secondary | ICD-10-CM

## 2014-09-06 DIAGNOSIS — R52 Pain, unspecified: Secondary | ICD-10-CM

## 2014-09-10 ENCOUNTER — Ambulatory Visit
Admission: RE | Admit: 2014-09-10 | Discharge: 2014-09-10 | Disposition: A | Discharge status: Home / Self Care | Payer: 59 | Source: Ambulatory Visit | Attending: Orthopedic Surgery | Admitting: Orthopedic Surgery

## 2014-09-10 ENCOUNTER — Other Ambulatory Visit: Payer: Self-pay

## 2014-09-10 DIAGNOSIS — R52 Pain, unspecified: Secondary | ICD-10-CM

## 2014-09-10 DIAGNOSIS — R609 Edema, unspecified: Secondary | ICD-10-CM

## 2014-11-02 ENCOUNTER — Emergency Department (HOSPITAL_BASED_OUTPATIENT_CLINIC_OR_DEPARTMENT_OTHER)
Admission: EM | Admit: 2014-11-02 | Discharge: 2014-11-03 | Disposition: A | Discharge status: Home / Self Care | Payer: 59 | Attending: Emergency Medicine | Admitting: Emergency Medicine

## 2014-11-02 DIAGNOSIS — Y998 Other external cause status: Secondary | ICD-10-CM | POA: Insufficient documentation

## 2014-11-02 DIAGNOSIS — Z8669 Personal history of other diseases of the nervous system and sense organs: Secondary | ICD-10-CM | POA: Insufficient documentation

## 2014-11-02 DIAGNOSIS — T148XXA Other injury of unspecified body region, initial encounter: Secondary | ICD-10-CM

## 2014-11-02 DIAGNOSIS — Z79899 Other long term (current) drug therapy: Secondary | ICD-10-CM | POA: Insufficient documentation

## 2014-11-02 DIAGNOSIS — X58XXXA Exposure to other specified factors, initial encounter: Secondary | ICD-10-CM | POA: Insufficient documentation

## 2014-11-02 DIAGNOSIS — S39012A Strain of muscle, fascia and tendon of lower back, initial encounter: Secondary | ICD-10-CM | POA: Insufficient documentation

## 2014-11-02 DIAGNOSIS — I10 Essential (primary) hypertension: Secondary | ICD-10-CM | POA: Insufficient documentation

## 2014-11-02 DIAGNOSIS — F419 Anxiety disorder, unspecified: Secondary | ICD-10-CM | POA: Insufficient documentation

## 2014-11-02 DIAGNOSIS — Y929 Unspecified place or not applicable: Secondary | ICD-10-CM | POA: Insufficient documentation

## 2014-11-02 DIAGNOSIS — Y9389 Activity, other specified: Secondary | ICD-10-CM | POA: Insufficient documentation

## 2014-11-02 DIAGNOSIS — R079 Chest pain, unspecified: Secondary | ICD-10-CM | POA: Insufficient documentation

## 2014-11-02 DIAGNOSIS — R011 Cardiac murmur, unspecified: Secondary | ICD-10-CM | POA: Insufficient documentation

## 2014-11-02 LAB — URINALYSIS, ROUTINE W REFLEX MICROSCOPIC
Bilirubin Urine: NEGATIVE
GLUCOSE, UA: NEGATIVE mg/dL
HGB URINE DIPSTICK: NEGATIVE
Ketones, ur: NEGATIVE mg/dL
Leukocytes, UA: NEGATIVE
Nitrite: NEGATIVE
Protein, ur: NEGATIVE mg/dL
Specific Gravity, Urine: 1.021 (ref 1.005–1.030)
Urobilinogen, UA: 0.2 mg/dL (ref 0.0–1.0)
pH: 5.5 (ref 5.0–8.0)

## 2014-11-02 NOTE — ED Notes (Signed)
Pt. C/O L chest pain with Lower Left back pain.  No c/o shortness of breath or vomiting or diarrhea.

## 2014-11-03 ENCOUNTER — Encounter (HOSPITAL_BASED_OUTPATIENT_CLINIC_OR_DEPARTMENT_OTHER): Payer: Self-pay | Admitting: Emergency Medicine

## 2014-11-03 LAB — BASIC METABOLIC PANEL
Anion gap: 1 — ABNORMAL LOW (ref 5–15)
BUN: 18 mg/dL (ref 6–23)
CO2: 30 mmol/L (ref 19–32)
CREATININE: 1.03 mg/dL (ref 0.50–1.35)
Calcium: 8.8 mg/dL (ref 8.4–10.5)
Chloride: 104 mmol/L (ref 96–112)
GFR calc Af Amer: >90 mL/min (ref >90)
GFR, EST NON AFRICAN AMERICAN: 89 mL/min — AB (ref >90)
Glucose, Bld: 96 mg/dL (ref 70–99)
Potassium: 3.5 mmol/L (ref 3.5–5.1)
Sodium: 135 mmol/L (ref 135–145)

## 2014-11-03 LAB — CBC WITH DIFFERENTIAL/PLATELET
BASOS ABS: 0 10*3/uL (ref 0.0–0.1)
Basophils Relative: 1 % (ref 0–1)
EOS ABS: 0.1 10*3/uL (ref 0.0–0.7)
Eosinophils Relative: 2 % (ref 0–5)
HCT: 41.2 % (ref 39.0–52.0)
HEMOGLOBIN: 13.7 g/dL (ref 13.0–17.0)
LYMPHS PCT: 42 % (ref 12–46)
Lymphs Abs: 2.7 10*3/uL (ref 0.7–4.0)
MCH: 26.1 pg (ref 26.0–34.0)
MCHC: 33.3 g/dL (ref 30.0–36.0)
MCV: 78.5 fL (ref 78.0–100.0)
MONO ABS: 0.7 10*3/uL (ref 0.1–1.0)
Monocytes Relative: 11 % (ref 3–12)
NEUTROS PCT: 44 % (ref 43–77)
Neutro Abs: 2.8 10*3/uL (ref 1.7–7.7)
PLATELETS: 256 10*3/uL (ref 150–400)
RBC: 5.25 MIL/uL (ref 4.22–5.81)
RDW: 14.2 % (ref 11.5–15.5)
WBC: 6.4 10*3/uL (ref 4.0–10.5)

## 2014-11-03 LAB — TROPONIN I: Troponin I: <0.03 ng/mL (ref <0.031)

## 2014-11-03 MED ORDER — NAPROXEN 250 MG PO TABS
500.0000 mg | ORAL_TABLET | Freq: Once | ORAL | Status: AC
  Administered 2014-11-03: 500 mg via ORAL
  Filled 2014-11-03: qty 2

## 2014-11-03 MED ORDER — NAPROXEN 375 MG PO TABS: 375.0000 mg | ORAL_TABLET | Freq: Two times a day (BID) | ORAL | Status: DC

## 2014-11-03 MED ORDER — METHOCARBAMOL 500 MG PO TABS
1000.0000 mg | ORAL_TABLET | Freq: Once | ORAL | Status: AC
  Administered 2014-11-03: 1000 mg via ORAL
  Filled 2014-11-03: qty 2

## 2014-11-03 NOTE — Discharge Instructions (Signed)

## 2014-11-03 NOTE — ED Provider Notes (Signed)
CSN: [id number]     Arrival date & time 11/02/14  2048 History   First MD Initiated Contact with Patient 11/03/14 0006     Chief Complaint  Patient presents with  . Chest Pain  . Back Pain     (Consider location/radiation/quality/duration/timing/severity/associated sxs/prior Treatment) Patient is a 44 y.o. male presenting with back pain. The history is provided by the patient.  Back Pain Location:  Sacro-iliac joint Quality:  Aching Radiates to:  Does not radiate Pain severity:  Moderate Pain is:  Same all the time Onset quality:  Sudden Duration:  1 week Timing:  Constant Progression:  Unchanged Chronicity:  New Context: not jumping from heights and not lifting heavy objects   Relieved by:  Nothing Worsened by:  Nothing tried Ineffective treatments:  None tried Associated symptoms: chest pain   Associated symptoms: no abdominal pain, no abdominal swelling, no bladder incontinence, no bowel incontinence, no dysuria, no fever, no headaches, no leg pain, no numbness, no paresthesias, no pelvic pain, no perianal numbness, no tingling, no weakness and no weight loss   Risk factors: no hx of cancer   chest pain for that duration no leg pain or swelling no car or plane trips.  No DOE or sob no surgeries  Past Medical History  Diagnosis Date  . Murmur   . Hypertension   . Bell's palsy    No past surgical history on file. Family History  Problem Relation Age of Onset  . Diabetes Other   . Hypertension Other    History  Substance Use Topics  . Smoking status: Never Smoker   . Smokeless tobacco: Not on file  . Alcohol Use: No    Review of Systems  Constitutional: Negative for fever and weight loss.  Respiratory: Negative for cough and shortness of breath.   Cardiovascular: Positive for chest pain. Negative for palpitations and leg swelling.  Gastrointestinal: Negative for abdominal pain and bowel incontinence.  Genitourinary: Negative for bladder incontinence, dysuria,  frequency, flank pain and pelvic pain.  Musculoskeletal: Positive for back pain.  Neurological: Negative for tingling, weakness, numbness, headaches and paresthesias.  All other systems reviewed and are negative.     Allergies  Review of patient's allergies indicates no known allergies.  Home Medications   Prior to Admission medications   Medication Sig Start Date End Date Taking? Authorizing Provider  HYDROcodone-acetaminophen (HYCET) 7.5-325 mg/15 ml solution Take 15 mLs by mouth every 8 (eight) hours as needed for moderate pain. 08/08/14   Vida RollerBrian D Miller, MD  ibuprofen (ADVIL,MOTRIN) 800 MG tablet Take 1 tablet (800 mg total) by mouth 3 (three) times daily. 08/08/14   Vida RollerBrian D Miller, MD  irbesartan-hydrochlorothiazide (AVALIDE) 150-12.5 MG per tablet Take 1 tablet by mouth daily. 12/16/13   Vanetta MuldersScott Zackowski, MD   BP 133/84 mmHg  Pulse 80  Temp(Src) 98.6 F (37 C) (Oral)  Resp 17  Ht 5\' 8"  (1.727 m)  Wt 225 lb (102.059 kg)  BMI 34.22 kg/m2  SpO2 98% Physical Exam  Constitutional: He is oriented to person, place, and time. He appears well-developed and well-nourished. No distress.  HENT:  Head: Normocephalic and atraumatic.  Mouth/Throat: Oropharynx is clear and moist.  Eyes: Conjunctivae are normal. Pupils are equal, round, and reactive to light.  Neck: Normal range of motion. Neck supple.  Cardiovascular: Normal rate, regular rhythm and intact distal pulses.   Pulmonary/Chest: Effort normal and breath sounds normal. No respiratory distress. He has no wheezes. He has no rales.  Abdominal:  Soft. Bowel sounds are normal. There is no tenderness. There is no rebound and no guarding.  Musculoskeletal: Normal range of motion. He exhibits no edema or tenderness.  Palpable spasm in the paraspinal muscle  Neurological: He is alert and oriented to person, place, and time. He has normal reflexes. He displays normal reflexes. He exhibits normal muscle tone.  Skin: Skin is warm and dry.   Psychiatric: His mood appears anxious. His speech is rapid and/or pressured.    ED Course  Procedures (including critical care time) Labs Review Labs Reviewed  BASIC METABOLIC PANEL - Abnormal; Notable for the following:    GFR calc non Af Amer 89 (*)    Anion gap 1 (*)    All other components within normal limits  URINALYSIS, ROUTINE W REFLEX MICROSCOPIC  CBC WITH DIFFERENTIAL/PLATELET  TROPONIN I    Imaging Review No results found.   EKG Interpretation   Date/Time:  Friday November 02 2014 21:02:12 EST Ventricular Rate:  71 PR Interval:  172 QRS Duration: 96 QT Interval:  380 QTC Calculation: 412 R Axis:   23 Text Interpretation:  Normal sinus rhythm with sinus arrhythmia Normal ECG  since last tracing no significant change Confirmed by BELFI  MD, MELANIE  ([id number]) on 11/02/2014 10:32:57 PM      MDM   Final diagnoses:  Muscle strain    PERC negative wells 0 highly doubt PE pain in low back is low with spasm.  In the setting of ongoing CP with negative troponin and EKG ACS is excluded.  Suspect anxiety.  Strict return precautions given    Corayma Cashatt K Tashyra Adduci-Rasch, MD 11/03/14 [phone]

## 2014-12-10 ENCOUNTER — Encounter (HOSPITAL_BASED_OUTPATIENT_CLINIC_OR_DEPARTMENT_OTHER): Payer: Self-pay | Admitting: *Deleted

## 2014-12-10 ENCOUNTER — Emergency Department (HOSPITAL_BASED_OUTPATIENT_CLINIC_OR_DEPARTMENT_OTHER): Payer: 59

## 2014-12-10 ENCOUNTER — Emergency Department (HOSPITAL_BASED_OUTPATIENT_CLINIC_OR_DEPARTMENT_OTHER)
Admission: EM | Admit: 2014-12-10 | Discharge: 2014-12-10 | Disposition: A | Discharge status: Home / Self Care | Payer: 59 | Attending: Emergency Medicine | Admitting: Emergency Medicine

## 2014-12-10 DIAGNOSIS — R531 Weakness: Secondary | ICD-10-CM

## 2014-12-10 DIAGNOSIS — Z79899 Other long term (current) drug therapy: Secondary | ICD-10-CM | POA: Diagnosis not present

## 2014-12-10 DIAGNOSIS — R079 Chest pain, unspecified: Secondary | ICD-10-CM | POA: Diagnosis present

## 2014-12-10 DIAGNOSIS — Z8669 Personal history of other diseases of the nervous system and sense organs: Secondary | ICD-10-CM | POA: Diagnosis not present

## 2014-12-10 DIAGNOSIS — I10 Essential (primary) hypertension: Secondary | ICD-10-CM | POA: Insufficient documentation

## 2014-12-10 DIAGNOSIS — R0789 Other chest pain: Secondary | ICD-10-CM | POA: Diagnosis not present

## 2014-12-10 DIAGNOSIS — R011 Cardiac murmur, unspecified: Secondary | ICD-10-CM | POA: Insufficient documentation

## 2014-12-10 LAB — CBC WITH DIFFERENTIAL/PLATELET
BASOS ABS: 0 10*3/uL (ref 0.0–0.1)
Basophils Relative: 1 % (ref 0–1)
Eosinophils Absolute: 0.1 10*3/uL (ref 0.0–0.7)
Eosinophils Relative: 2 % (ref 0–5)
HCT: 42.1 % (ref 39.0–52.0)
Hemoglobin: 14.1 g/dL (ref 13.0–17.0)
LYMPHS PCT: 31 % (ref 12–46)
Lymphs Abs: 2 10*3/uL (ref 0.7–4.0)
MCH: 26.6 pg (ref 26.0–34.0)
MCHC: 33.5 g/dL (ref 30.0–36.0)
MCV: 79.3 fL (ref 78.0–100.0)
MONO ABS: 0.7 10*3/uL (ref 0.1–1.0)
Monocytes Relative: 11 % (ref 3–12)
Neutro Abs: 3.7 10*3/uL (ref 1.7–7.7)
Neutrophils Relative %: 57 % (ref 43–77)
Platelets: 271 10*3/uL (ref 150–400)
RBC: 5.31 MIL/uL (ref 4.22–5.81)
RDW: 13.8 % (ref 11.5–15.5)
WBC: 6.5 10*3/uL (ref 4.0–10.5)

## 2014-12-10 LAB — BASIC METABOLIC PANEL
ANION GAP: 7 (ref 5–15)
BUN: 18 mg/dL (ref 6–23)
CALCIUM: 9.3 mg/dL (ref 8.4–10.5)
CO2: 30 mmol/L (ref 19–32)
Chloride: 100 mmol/L (ref 96–112)
Creatinine, Ser: 1.08 mg/dL (ref 0.50–1.35)
GFR, EST NON AFRICAN AMERICAN: 84 mL/min — AB (ref >90)
GLUCOSE: 95 mg/dL (ref 70–99)
POTASSIUM: 3.7 mmol/L (ref 3.5–5.1)
Sodium: 137 mmol/L (ref 135–145)

## 2014-12-10 LAB — TROPONIN I: Troponin I: <0.03 ng/mL (ref <0.031)

## 2014-12-10 NOTE — ED Provider Notes (Signed)
CSN: [id number]     Arrival date & time 12/10/14  1012 History   First MD Initiated Contact with Patient 12/10/14 1036     Chief Complaint  Patient presents with  . Chest Pain     (Consider location/radiation/quality/duration/timing/severity/associated sxs/prior Treatment) HPI Comments: Patient is a 44 year old male with no prior cardiac history. He presents for evaluation of tightness in his chest for the past 2 days. He feels short of breath at times, but denies nausea, diaphoresis, or radiation to his arm or jaw. Pain is worse with palpation and movement and relieved with rest.  He also reports feeling as if he is "moving in slow motion" for the past 2 months. He states that others have noticed a change in him that he seems more sluggish and not his usual self. He reports moments of confusion and numbness in his hands that occurs nightly.  Patient is a 44 y.o. male presenting with chest pain. The history is provided by the patient.  Chest Pain Pain location:  Substernal area Pain quality: tightness   Pain radiates to:  Does not radiate Pain radiates to the back: no   Pain severity:  Mild Onset quality:  Sudden Duration:  2 days Timing:  Constant Progression:  Unchanged Chronicity:  New Context: movement   Context: not breathing   Relieved by:  Nothing Worsened by:  Movement Ineffective treatments:  None tried   Past Medical History  Diagnosis Date  . Murmur   . Hypertension   . Bell's palsy    History reviewed. No pertinent past surgical history. Family History  Problem Relation Age of Onset  . Diabetes Other   . Hypertension Other    History  Substance Use Topics  . Smoking status: Never Smoker   . Smokeless tobacco: Not on file  . Alcohol Use: No    Review of Systems  Cardiovascular: Positive for chest pain.  All other systems reviewed and are negative.     Allergies  Review of patient's allergies indicates no known allergies.  Home Medications    Prior to Admission medications   Medication Sig Start Date End Date Taking? Authorizing Provider  irbesartan-hydrochlorothiazide (AVALIDE) 150-12.5 MG per tablet Take 1 tablet by mouth daily. 12/16/13  Yes Vanetta MuldersScott Zackowski, MD   BP 139/76 mmHg  Pulse 75  Temp(Src) 98.4 F (36.9 C) (Oral)  Resp 16  Ht 5\' 8"  (1.727 m)  Wt 220 lb (99.791 kg)  BMI 33.46 kg/m2  SpO2 100% Physical Exam  Constitutional: He is oriented to person, place, and time. He appears well-developed and well-nourished. No distress.  HENT:  Head: Normocephalic and atraumatic.  Mouth/Throat: Oropharynx is clear and moist.  Eyes: EOM are normal. Pupils are equal, round, and reactive to light.  Neck: Normal range of motion. Neck supple.  Cardiovascular: Normal rate, regular rhythm and normal heart sounds.   No murmur heard. Pulmonary/Chest: Effort normal and breath sounds normal. No respiratory distress. He has no wheezes. He has no rales.  Abdominal: Soft. Bowel sounds are normal. He exhibits no distension. There is no tenderness.  Musculoskeletal: Normal range of motion. He exhibits no edema.  Lymphadenopathy:    He has no cervical adenopathy.  Neurological: He is alert and oriented to person, place, and time. No cranial nerve deficit. He exhibits normal muscle tone. Coordination normal.  Skin: Skin is warm and dry. He is not diaphoretic.  Nursing note and vitals reviewed.   ED Course  Procedures (including critical care time) Labs Review  Labs Reviewed  BASIC METABOLIC PANEL  CBC WITH DIFFERENTIAL/PLATELET  TROPONIN I    Imaging Review No results found.   EKG Interpretation   Date/Time:  Monday December 10 2014 10:26:09 EDT Ventricular Rate:  66 PR Interval:  146 QRS Duration: 94 QT Interval:  388 QTC Calculation: 406 R Axis:   66 Text Interpretation:  Normal sinus rhythm with sinus arrhythmia  Nonspecific T wave abnormality Abnormal ECG Confirmed by DELOS  MD,  Moe Brier ([id number]) on 12/10/2014 10:37:38  AM      MDM   Final diagnoses:  None    Patient presents with complaints of atypical chest pain and feeling as though he is "moving in slow motion" for the past 2 months. His workup today is unremarkable including EKG, chest x-ray, laboratory studies, and CT scan of his head. I'm uncertain as to the exact etiology of his symptoms, however nothing today appears emergent. I highly doubt a cardiac event. He may have some neurologic issue, however nothing I feel that warrants admission. I think an outpatient workup would be most appropriate into this. The patient has a follow up appointment in the near future with his primary Dr. and I have advised him to keep this. If he is not improving he may require referral to a neurologist for their opinion.    Geoffery Lyons, MD 12/10/14 [phone]

## 2014-12-10 NOTE — ED Notes (Signed)
C/o left sided chest pain, states he feels like he is moving in slow motion x 1 month. States his hands fall asleep every night. No other sx.

## 2014-12-10 NOTE — Discharge Instructions (Signed)
Follow-up with your primary Dr. if not improving in the next week.   Chest Pain (Nonspecific) It is often hard to give a specific diagnosis for the cause of chest pain. There is always a chance that your pain could be related to something serious, such as a heart attack or a blood clot in the lungs. You need to follow up with your health care provider for further evaluation. CAUSES   Heartburn.  Pneumonia or bronchitis.  Anxiety or stress.  Inflammation around your heart (pericarditis) or lung (pleuritis or pleurisy).  A blood clot in the lung.  A collapsed lung (pneumothorax). It can develop suddenly on its own (spontaneous pneumothorax) or from trauma to the chest.  Shingles infection (herpes zoster virus). The chest wall is composed of bones, muscles, and cartilage. Any of these can be the source of the pain.  The bones can be bruised by injury.  The muscles or cartilage can be strained by coughing or overwork.  The cartilage can be affected by inflammation and become sore (costochondritis). DIAGNOSIS  Lab tests or other studies may be needed to find the cause of your pain. Your health care provider may have you take a test called an ambulatory electrocardiogram (ECG). An ECG records your heartbeat patterns over a 24-hour period. You may also have other tests, such as:  Transthoracic echocardiogram (TTE). During echocardiography, sound waves are used to evaluate how blood flows through your heart.  Transesophageal echocardiogram (TEE).  Cardiac monitoring. This allows your health care provider to monitor your heart rate and rhythm in real time.  Holter monitor. This is a portable device that records your heartbeat and can help diagnose heart arrhythmias. It allows your health care provider to track your heart activity for several days, if needed.  Stress tests by exercise or by giving medicine that makes the heart beat faster. TREATMENT   Treatment depends on what may be  causing your chest pain. Treatment may include:  Acid blockers for heartburn.  Anti-inflammatory medicine.  Pain medicine for inflammatory conditions.  Antibiotics if an infection is present.  You may be advised to change lifestyle habits. This includes stopping smoking and avoiding alcohol, caffeine, and chocolate.  You may be advised to keep your head raised (elevated) when sleeping. This reduces the chance of acid going backward from your stomach into your esophagus. Most of the time, nonspecific chest pain will improve within 2-3 days with rest and mild pain medicine.  HOME CARE INSTRUCTIONS   If antibiotics were prescribed, take them as directed. Finish them even if you start to feel better.  For the next few days, avoid physical activities that bring on chest pain. Continue physical activities as directed.  Do not use any tobacco products, including cigarettes, chewing tobacco, or electronic cigarettes.  Avoid drinking alcohol.  Only take medicine as directed by your health care provider.  Follow your health care provider's suggestions for further testing if your chest pain does not go away.  Keep any follow-up appointments you made. If you do not go to an appointment, you could develop lasting (chronic) problems with pain. If there is any problem keeping an appointment, call to reschedule. SEEK MEDICAL CARE IF:   Your chest pain does not go away, even after treatment.  You have a rash with blisters on your chest.  You have a fever. SEEK IMMEDIATE MEDICAL CARE IF:   You have increased chest pain or pain that spreads to your arm, neck, jaw, back, or abdomen.  You have shortness of breath.  You have an increasing cough, or you cough up blood.  You have severe back or abdominal pain.  You feel nauseous or vomit.  You have severe weakness.  You faint.  You have chills. This is an emergency. Do not wait to see if the pain will go away. Get medical help at once.  Call your local emergency services (911 in [place] Do not drive yourself to the hospital. MAKE SURE YOU:   Understand these instructions.  Will watch your condition.  Will get help right away if you are not doing well or get worse. Document Released: 05/27/2005 Document Revised: 08/22/2013 Document Reviewed: 03/22/2008 [facility] Patient Information 2015 [place], [place]. This information is not intended to replace advice given to you by your health care provider. Make sure you discuss any questions you have with your health care provider.

## 2014-12-10 NOTE — ED Notes (Signed)
Pt placed on cardiac monitor 

## 2015-01-07 ENCOUNTER — Emergency Department (HOSPITAL_BASED_OUTPATIENT_CLINIC_OR_DEPARTMENT_OTHER)
Admission: EM | Admit: 2015-01-07 | Discharge: 2015-01-07 | Disposition: A | Discharge status: Home / Self Care | Payer: 59 | Attending: Emergency Medicine | Admitting: Emergency Medicine

## 2015-01-07 ENCOUNTER — Encounter (HOSPITAL_BASED_OUTPATIENT_CLINIC_OR_DEPARTMENT_OTHER): Payer: Self-pay | Admitting: *Deleted

## 2015-01-07 ENCOUNTER — Emergency Department (HOSPITAL_BASED_OUTPATIENT_CLINIC_OR_DEPARTMENT_OTHER): Payer: 59

## 2015-01-07 DIAGNOSIS — I10 Essential (primary) hypertension: Secondary | ICD-10-CM | POA: Insufficient documentation

## 2015-01-07 DIAGNOSIS — R1011 Right upper quadrant pain: Secondary | ICD-10-CM | POA: Diagnosis not present

## 2015-01-07 DIAGNOSIS — K5900 Constipation, unspecified: Secondary | ICD-10-CM | POA: Diagnosis not present

## 2015-01-07 DIAGNOSIS — Z8669 Personal history of other diseases of the nervous system and sense organs: Secondary | ICD-10-CM | POA: Diagnosis not present

## 2015-01-07 DIAGNOSIS — R011 Cardiac murmur, unspecified: Secondary | ICD-10-CM | POA: Diagnosis not present

## 2015-01-07 LAB — COMPREHENSIVE METABOLIC PANEL
ALT: 30 U/L (ref 17–63)
AST: 18 U/L (ref 15–41)
Albumin: 4.1 g/dL (ref 3.5–5.0)
Alkaline Phosphatase: 42 U/L (ref 38–126)
Anion gap: 8 (ref 5–15)
BUN: 17 mg/dL (ref 6–20)
CO2: 29 mmol/L (ref 22–32)
Calcium: 9.2 mg/dL (ref 8.9–10.3)
Chloride: 100 mmol/L — ABNORMAL LOW (ref 101–111)
Creatinine, Ser: 1.13 mg/dL (ref 0.61–1.24)
GFR calc Af Amer: >60 mL/min (ref >60)
GFR calc non Af Amer: >60 mL/min (ref >60)
Glucose, Bld: 103 mg/dL — ABNORMAL HIGH (ref 70–99)
Potassium: 3.8 mmol/L (ref 3.5–5.1)
Sodium: 137 mmol/L (ref 135–145)
Total Bilirubin: 0.3 mg/dL (ref 0.3–1.2)
Total Protein: 7.3 g/dL (ref 6.5–8.1)

## 2015-01-07 LAB — CBC WITH DIFFERENTIAL/PLATELET
Basophils Absolute: 0 10*3/uL (ref 0.0–0.1)
Basophils Relative: 0 % (ref 0–1)
Eosinophils Absolute: 0.1 10*3/uL (ref 0.0–0.7)
Eosinophils Relative: 2 % (ref 0–5)
HCT: 39.4 % (ref 39.0–52.0)
Hemoglobin: 13.3 g/dL (ref 13.0–17.0)
Lymphocytes Relative: 34 % (ref 12–46)
Lymphs Abs: 2.4 10*3/uL (ref 0.7–4.0)
MCH: 26.8 pg (ref 26.0–34.0)
MCHC: 33.8 g/dL (ref 30.0–36.0)
MCV: 79.4 fL (ref 78.0–100.0)
Monocytes Absolute: 0.6 10*3/uL (ref 0.1–1.0)
Monocytes Relative: 9 % (ref 3–12)
Neutro Abs: 3.9 10*3/uL (ref 1.7–7.7)
Neutrophils Relative %: 55 % (ref 43–77)
Platelets: 241 10*3/uL (ref 150–400)
RBC: 4.96 MIL/uL (ref 4.22–5.81)
RDW: 13.6 % (ref 11.5–15.5)
WBC: 7 10*3/uL (ref 4.0–10.5)

## 2015-01-07 LAB — LIPASE, BLOOD: Lipase: 35 U/L (ref 22–51)

## 2015-01-07 MED ORDER — SODIUM CHLORIDE 0.9 % IV BOLUS (SEPSIS)
1000.0000 mL | Freq: Once | INTRAVENOUS | Status: AC
  Administered 2015-01-07: 1000 mL via INTRAVENOUS

## 2015-01-07 MED ORDER — MORPHINE SULFATE 4 MG/ML IJ SOLN
4.0000 mg | Freq: Once | INTRAMUSCULAR | Status: AC
  Administered 2015-01-07: 4 mg via INTRAVENOUS
  Filled 2015-01-07: qty 1

## 2015-01-07 NOTE — Discharge Instructions (Signed)
Continue to take stool softener as prescribed by your family provider today. It may takes several days to start working.  See below for further instructions about abdominal pain and constipation.    Abdominal Pain Many things can cause belly (abdominal) pain. Most times, the belly pain is not dangerous. Many cases of belly pain can be watched and treated at home. HOME CARE   Do not take medicines that help you go poop (laxatives) unless told to by your doctor.  Only take medicine as told by your doctor.  Eat or drink as told by your doctor. Your doctor will tell you if you should be on a special diet. GET HELP IF:  You do not know what is causing your belly pain.  You have belly pain while you are sick to your stomach (nauseous) or have runny poop (diarrhea).  You have pain while you pee or poop.  Your belly pain wakes you up at night.  You have belly pain that gets worse or better when you eat.  You have belly pain that gets worse when you eat fatty foods.  You have a fever. GET HELP RIGHT AWAY IF:   The pain does not go away within 2 hours.  You keep throwing up (vomiting).  The pain changes and is only in the right or left part of the belly.  You have bloody or tarry looking poop. MAKE SURE YOU:   Understand these instructions.  Will watch your condition.  Will get help right away if you are not doing well or get worse. Document Released: 02/03/2008 Document Revised: 08/22/2013 Document Reviewed: 04/26/2013 [facility] Patient Information 2015 [place]. This information is not intended to replace advice given to you by your health care provider. Make sure you discuss any questions you have with your health care provider.

## 2015-01-07 NOTE — ED Notes (Signed)
Pt reports RUQ pain x 2 weeks.  Tender on palpation.  Denies N/V/D.

## 2015-01-07 NOTE — ED Provider Notes (Signed)
CSN: [id number]     Arrival date & time 01/07/15  2048 History   First MD Initiated Contact with Patient 01/07/15 2152     Chief Complaint  Patient presents with  . Abdominal Pain     (Consider location/radiation/quality/duration/timing/severity/associated sxs/prior Treatment) HPI Pt is a 44yo male presenting to ED with c/o RUQ abdominal pain that has been gradually worsening for 2 weeks. Pt reports associated constipation as well as mild intermittent nausea. Abdominal pain is constant, aching and sore, 9/10 at worst. Pt reports seeing his PCP earlier last week and was prescribed a stool softener, which he just started today.  Denies fever, vomiting or diarrhea. Pt reports hx of constipation for several years. Has never f/u with GI. No hx of abdominal surgeries.  Denies chest pain or SOB. No hx of kidney stones. Denies urinary symptoms.  Past Medical History  Diagnosis Date  . Murmur   . Hypertension   . Bell's palsy    History reviewed. No pertinent past surgical history. Family History  Problem Relation Age of Onset  . Diabetes Other   . Hypertension Other    History  Substance Use Topics  . Smoking status: Never Smoker   . Smokeless tobacco: Not on file  . Alcohol Use: No    Review of Systems  Constitutional: Negative for fever and chills.  Respiratory: Negative for cough and shortness of breath.   Cardiovascular: Negative for chest pain and palpitations.  Gastrointestinal: Positive for nausea, abdominal pain and constipation. Negative for vomiting and diarrhea.  Genitourinary: Negative for dysuria, hematuria and flank pain.  Musculoskeletal: Negative for myalgias and back pain.  All other systems reviewed and are negative.     Allergies  Review of patient's allergies indicates no known allergies.  Home Medications   Prior to Admission medications   Medication Sig Start Date End Date Taking? Authorizing Provider  irbesartan-hydrochlorothiazide (AVALIDE) 150-12.5  MG per tablet Take 1 tablet by mouth daily. 12/16/13   Vanetta MuldersScott Zackowski, MD   BP 122/75 mmHg  Pulse 78  Temp(Src) 97.9 F (36.6 C) (Oral)  Resp 18  Ht 5\' 8"  (1.727 m)  Wt 225 lb (102.059 kg)  BMI 34.22 kg/m2  SpO2 98% Physical Exam  Constitutional: He appears well-developed and well-nourished.  HENT:  Head: Normocephalic and atraumatic.  Eyes: Conjunctivae are normal. No scleral icterus.  Neck: Normal range of motion. Neck supple.  Cardiovascular: Normal rate, regular rhythm and normal heart sounds.   Pulmonary/Chest: Effort normal and breath sounds normal. No respiratory distress. He has no wheezes. He has no rales. He exhibits no tenderness.  Abdominal: Soft. Bowel sounds are normal. He exhibits no distension and no mass. There is tenderness. There is no rebound, no guarding and no CVA tenderness.  Soft, non-distended, tenderness to RUQ w/o rebound or guarding  Musculoskeletal: Normal range of motion.  Neurological: He is alert.  Skin: Skin is warm and dry.  Nursing note and vitals reviewed.   ED Course  Procedures (including critical care time) Labs Review Labs Reviewed  COMPREHENSIVE METABOLIC PANEL - Abnormal; Notable for the following:    Chloride 100 (*)    Glucose, Bld 103 (*)    All other components within normal limits  CBC WITH DIFFERENTIAL/PLATELET  LIPASE, BLOOD    Imaging Review Ct Renal Stone Study  01/07/2015   CLINICAL DATA:  Right upper quadrant pain for 2 weeks. Right flank pain.  EXAM: CT ABDOMEN AND PELVIS WITHOUT CONTRAST  TECHNIQUE: Multidetector CT imaging of the  abdomen and pelvis was performed following the standard protocol without IV contrast.  COMPARISON:  None.  FINDINGS: There is slight elevation of the right hemidiaphragm. Liver parenchyma is normal. Gallbladder is contracted and appears normal. No dilated bile ducts.  Spleen, pancreas, adrenal glands, and left kidney are normal. There is a 16 mm low-density lesion in the posterior aspect of the  mid right kidney measuring -1 Hounsfield units, most likely a cyst.  The bowel is normal except for a single diverticulum in the ascending colon. Appendix and terminal ileum are normal. Bladder and prostate gland are normal.  No free air or free fluid. No adenopathy. No calcifications in the abdominal aorta. No osseous abnormalities.  IMPRESSION: Benign-appearing abdomen. Normal appearing gallbladder. Single diverticulum in the ascending colon.  Low-density lesion in the right kidney is most likely a cyst.   Electronically Signed   By: Francene BoyersJames  Maxwell M.D.   On: 01/07/2015 22:55     EKG Interpretation None      MDM   Final diagnoses:  RUQ abdominal pain  Constipation, unspecified constipation type   Pt c/o RUQ abdominal pain. Tenderness to area. Doubt ACS. Concern for cholecystitis vs renal stone, low concern for SBO or bowel perforation as abdomen is soft. No vomiting. Pt is afebrile  Labs: unremarkable.  CT: benign appearing abdomen. Normal appearing gallbladder. On exam of the CT scan, large amount of stool seen in Right side of abdomen in ascending colon.  Pt started on a stool softener by his PCP last week but pt just started it today. Encouraged pt to continue taking. F/u with PCP for recheck of symptoms. Return precautions provided. Pt verbalized understanding and agreement with tx plan.    Junius FinnerErin O'Malley, PA-C 01/08/15 [id number]  Jerelyn ScottMartha Linker, MD 01/09/15 [phone]

## 2015-02-04 ENCOUNTER — Encounter: Payer: Self-pay | Admitting: Neurology

## 2015-02-04 ENCOUNTER — Ambulatory Visit (INDEPENDENT_AMBULATORY_CARE_PROVIDER_SITE_OTHER): Payer: 59 | Admitting: Neurology

## 2015-02-04 ENCOUNTER — Other Ambulatory Visit: Payer: Self-pay | Admitting: *Deleted

## 2015-02-04 VITALS — BP 137/94 | HR 81 | Ht 68.0 in | Wt 212.0 lb

## 2015-02-04 DIAGNOSIS — G20 Parkinson's disease: Secondary | ICD-10-CM

## 2015-02-04 NOTE — Progress Notes (Signed)
PATIENT: Ruben Silva DOB: 05-May-1971  Chief Complaint  Patient presents with  . Weakness    He is here with his wife and son today.  Says he was in good health until October 2015 when he started noticing a slowing in his gait.  Since then his symptoms have worsened and include a slow, shuffling gait, intermittent blurred vision, numbess in bilateral hands/fingers, tremors in hands and unexplained weight loss.    HISTORICAL  Ruben Silva 44 yo RH male, is accompanied by his wife, and his son at today's clinical visit, he is referred by his primary care physician Dr. 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 docusate sodium (COLACE) 100 MG capsule   3  . ibuprofen (ADVIL,MOTRIN) 200 MG tablet Take 200 mg by mouth every 6 (six) hours as needed.    . polyethylene glycol powder (GLYCOLAX/MIRALAX) powder     . valsartan-hydrochlorothiazide (DIOVAN-HCT) 160-12.5 MG per tablet        PAST MEDICAL HISTORY: Past Medical History  Diagnosis Date  . Murmur   . Hypertension   . Bell's palsy   . Constipation   . Weakness generalized     PAST SURGICAL HISTORY: Past Surgical  History  Procedure Laterality Date  . No past surgeries      FAMILY HISTORY: Family History  Problem Relation Age of Onset  . Hypertension Mother   . Liver disease Mother   . Hypercholesterolemia Mother   . Stroke Father   . Hypertension Father   . Diabetes Father     SOCIAL HISTORY:  History   Social History  . Marital Status: Married    Spouse Name: N/A  . Number of Children: 1  . Years of Education: College   Occupational History  . Emergency planning/management officerroject Manager    Social History Main Topics  . Smoking status: Never Smoker   . Smokeless tobacco: Not on file  . Alcohol Use: No  . Drug Use: No  . Sexual Activity: Not on file   Other Topics Concern  . Not on file   Social History Narrative   Lives at home with wife and son.   Right-hand.   Occasional use of caffeine.     PHYSICAL EXAM   Filed Vitals:   02/04/15 0729  BP: 137/94  Pulse: 81  Height: 5\' 8"  (1.727 m)  Weight: 212 lb (96.163 kg)    Not recorded      Body mass index is 32.24 kg/(m^2).  PHYSICAL EXAMNIATION:  Gen: NAD, conversant, well nourised, obese, well groomed                     Cardiovascular: Regular rate rhythm, no peripheral edema, warm, nontender. Eyes: Conjunctivae clear  without exudates or hemorrhage Neck: Supple, no carotid bruise. Pulmonary: Clear to auscultation bilaterally   NEUROLOGICAL EXAM:  MENTAL STATUS: decreased facial expression, mild masked face, Speech:    Speech is normal; fluent and spontaneous with normal comprehension.  Cognition:    The patient is oriented to person, place, and time;     recent and remote memory intact;     language fluent;     normal attention, concentration,     fund of knowledge.  CRANIAL NERVES: CN II: Visual fields are full to confrontation. Fundoscopic exam is normal with sharp discs and no vascular changes. Pupil equal round reactive to light CN III, IV, VI: extraocular movement are normal. No ptosis. CN V: Facial sensation is  intact to pinprick in all 3 divisions bilaterally. Corneal responses are intact.  CN VII: Face is symmetric with normal eye closure and smile. CN VIII: Hearing is normal to rubbing fingers CN IX, X: Palate elevates symmetrically. Phonation is normal. CN XI: Head turning and shoulder shrug are intact CN XII: Tongue is midline with normal movements and no atrophy.  MOTOR: There is no pronator drift of out-stretched arms. Muscle bulk and strength were normal, he has mild to moderate limb and nuchal rigidity,  REFLEXES: Reflexes are 2+ and symmetric at the biceps, triceps, knees, and ankles. Plantar responses are flexor.  SENSORY: Light touch, pinprick, position sense, and vibration sense are intact in fingers and toes.  COORDINATION: Rapid alternating movements and fine finger movements are intact. There is no dysmetria on finger-to-nose and heel-knee-shin. There are no abnormal or extraneous movements.   GAIT/STANCE: He has mild stooped forward posturing, decreased bilateral arm swing, mild retropulsed instability   DIAGNOSTIC DATA (LABS, IMAGING, TESTING) - I reviewed patient records, labs, notes, testing and imaging myself where available.  Lab Results  Component Value Date   WBC 7.0 01/07/2015   HGB 13.3 01/07/2015   HCT 39.4 01/07/2015   MCV 79.4 01/07/2015   PLT 241 01/07/2015      Component Value Date/Time   NA 137 01/07/2015 2200   K 3.8 01/07/2015 2200   CL 100 01/07/2015 2200   CO2 29 01/07/2015 2200   GLUCOSE 103 01/07/2015 2200   BUN 17 01/07/2015 2200   CREATININE 1.13 01/07/2015 2200   CALCIUM 9.2 01/07/2015 2200   PROT 7.3 01/07/2015 2200   ALBUMIN 4.1 01/07/2015 2200   AST 18 01/07/2015 2200   ALT 30 01/07/2015 2200   ALKPHOS 42 01/07/2015 2200   BILITOT 0.3 01/07/2015 2200   GFRNONAA >60 01/07/2015 2200   GFRAA >60 01/07/2015 2200   ASSESSMENT AND PLAN  Ruben Silva is a 44 y.o. male  With subacute onset progressive worsening mild parkinsonian  features,  1. Central nervous system degenerative disorder, need to rule out structural lesions 2, MRI of the brain 3, laboratory evaluations 4.  return to clinic in 2-3 weeks   Levert Feinstein, M.D. Ph.D.  [facility] [address], [address] [place], [place] [address] Ph: [phone] Fax: [phone]

## 2015-02-06 ENCOUNTER — Telehealth: Payer: Self-pay | Admitting: *Deleted

## 2015-02-06 NOTE — Telephone Encounter (Signed)
-----   Message from Levert FeinsteinYijun [name], MD sent at 02/06/2015  8:44 AM EDT ----- Please call patient for normal laboratory result

## 2015-02-06 NOTE — Telephone Encounter (Signed)
Patient notified of normal results.

## 2015-02-07 ENCOUNTER — Ambulatory Visit (INDEPENDENT_AMBULATORY_CARE_PROVIDER_SITE_OTHER): Payer: 59

## 2015-02-07 DIAGNOSIS — G20 Parkinson's disease: Secondary | ICD-10-CM | POA: Diagnosis not present

## 2015-02-09 LAB — PROTEIN ELECTROPHORESIS
A/G Ratio: 1.2 (ref 0.7–2.0)
Albumin ELP: 3.9 g/dL (ref 3.2–5.6)
Alpha 1: 0.2 g/dL (ref 0.1–0.4)
Alpha 2: 0.7 g/dL (ref 0.4–1.2)
BETA: 1 g/dL (ref 0.6–1.3)
GLOBULIN, TOTAL: 3.2 g/dL (ref 2.0–4.5)
Gamma Globulin: 1.3 g/dL (ref 0.5–1.6)
Total Protein: 7.1 g/dL (ref 6.0–8.5)

## 2015-02-09 LAB — SEDIMENTATION RATE: Sed Rate: 2 mm/hr (ref 0–15)

## 2015-02-09 LAB — FOLATE: Folate: >20 ng/mL (ref >3.0)

## 2015-02-09 LAB — C-REACTIVE PROTEIN: CRP: 4.1 mg/L (ref 0.0–4.9)

## 2015-02-09 LAB — HIV ANTIBODY (ROUTINE TESTING W REFLEX): HIV Screen 4th Generation wRfx: NONREACTIVE

## 2015-02-09 LAB — COMPLIANCE DRUG ANALYSIS, UR: PDF: 0

## 2015-02-09 LAB — B. BURGDORFI ANTIBODIES

## 2015-02-09 LAB — RPR: RPR: NONREACTIVE

## 2015-02-09 LAB — CK: Total CK: 101 U/L (ref 24–204)

## 2015-02-09 LAB — VITAMIN B12: VITAMIN B 12: 1182 pg/mL — AB (ref 211–946)

## 2015-02-11 ENCOUNTER — Telehealth: Payer: Self-pay | Admitting: *Deleted

## 2015-02-11 NOTE — Telephone Encounter (Signed)
-----   Message from Levert Feinstein, MD sent at 02/11/2015  7:52 AM EDT ----- Please call pt for normal MRI brain.

## 2015-02-11 NOTE — Telephone Encounter (Signed)
Spoke to patient he is aware of results.

## 2015-02-25 ENCOUNTER — Encounter: Payer: Self-pay | Admitting: Neurology

## 2015-02-25 ENCOUNTER — Ambulatory Visit: Payer: 59 | Admitting: Neurology

## 2015-02-25 ENCOUNTER — Ambulatory Visit (INDEPENDENT_AMBULATORY_CARE_PROVIDER_SITE_OTHER): Payer: 59 | Admitting: Neurology

## 2015-02-25 VITALS — BP 133/87 | HR 78 | Ht 68.0 in | Wt 212.0 lb

## 2015-02-25 DIAGNOSIS — G4700 Insomnia, unspecified: Secondary | ICD-10-CM | POA: Diagnosis not present

## 2015-02-25 DIAGNOSIS — R269 Unspecified abnormalities of gait and mobility: Secondary | ICD-10-CM | POA: Diagnosis not present

## 2015-02-25 DIAGNOSIS — G20 Parkinson's disease: Secondary | ICD-10-CM | POA: Diagnosis not present

## 2015-02-25 MED ORDER — CARBIDOPA-LEVODOPA 25-100 MG PO TABS: ORAL_TABLET | ORAL | Status: DC

## 2015-02-25 MED ORDER — CLONAZEPAM 0.5 MG PO TABS: ORAL_TABLET | ORAL | Status: DC

## 2015-02-25 NOTE — Progress Notes (Signed)
Chief Complaint  Patient presents with  . Tremors    He is here with his wife, Vivien Rota, to discuss his lab and MRI findings.      PATIENT: Ruben Silva DOB: 03-15-1971  Chief Complaint  Patient presents with  . Tremors    He is here with his wife, Vivien Rota, to discuss his lab and MRI findings.    HISTORICAL  Ruben Silva 44 yo RH male, is accompanied by his wife, and his son at today's clinical visit, he is referred by his primary care physician Dr. 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, he has mild difficulty writing, complains of dizziness, especially with sudden positional change  UPDATE June 27th 2016: There was no family history of similar disease,  I have reviewed MRI film with patient and his family, that was normal,  Laboratory showed normal or Negative TSH, ESR, C-reactive protein RPR HIV, B12,CMP CBC, protein electrophoresis  Complains of slow worsening symptoms, increased gait difficulty, dizziness when getting up quickly, constipation, difficulty sleeping at nighttime, excessive daytime sleepiness fatigue  REVIEW OF SYSTEMS: Full 14 system review of systems performed and notable only for fatigue, murmur, blurry vision, constipation, joint pain, achy muscles, headaches, numbness, weakness, dizziness, tremor, sleepiness  ALLERGIES: No Known Allergies  HOME MEDICATIONS: Current Outpatient Prescriptions  Medication Sig Dispense Refill  . docusate sodium (COLACE) 100 MG capsule   3  . ibuprofen (ADVIL,MOTRIN) 200 MG tablet Take 200 mg by mouth every 6 (six) hours as needed.    .  polyethylene glycol powder (GLYCOLAX/MIRALAX) powder     . valsartan-hydrochlorothiazide (DIOVAN-HCT) 160-12.5 MG per tablet        PAST MEDICAL HISTORY: Past Medical History  Diagnosis Date  . Murmur   . Hypertension   . Bell's palsy   . Constipation   . Weakness generalized     PAST SURGICAL HISTORY: Past Surgical History  Procedure Laterality Date  . No past surgeries      FAMILY HISTORY: Family History  Problem Relation Age of Onset  . Hypertension Mother   . Liver disease Mother   . Hypercholesterolemia Mother   . Stroke Father   . Hypertension Father   . Diabetes Father     SOCIAL HISTORY:  History   Social History  . Marital Status: Married    Spouse Name: N/A  . Number of Children: 1  . Years of Education: College   Occupational History  . Government social research officer    Social History Main Topics  . Smoking status: Never Smoker   . Smokeless tobacco: Not on file  . Alcohol Use: No  . Drug Use: No  . Sexual Activity: Not on file   Other Topics Concern  . Not on file   Social History Narrative   Lives at home with wife and son.   Right-hand.   Occasional use of caffeine.     PHYSICAL EXAM   Filed Vitals:   02/25/15 1549  BP: 133/87  Pulse: 78  Height: $Remove'5\' 8"'MOgNcQp$  (1.727 m)  Weight: 212 lb (96.163 kg)    Not recorded  Body mass index is 32.24 kg/(m^2).  PHYSICAL EXAMNIATION:  Gen: NAD, conversant, well nourised, obese, well groomed                     Cardiovascular: Regular rate rhythm, no peripheral edema, warm, nontender. Eyes: Conjunctivae clear without exudates or hemorrhage Neck: Supple, no carotid bruise. Pulmonary: Clear to auscultation bilaterally   NEUROLOGICAL EXAM:  MENTAL STATUS: decreased facial expression, mild masked face, Speech:    Speech is normal; fluent and spontaneous with normal comprehension.  Cognition:    The patient is oriented to person, place, and time;     recent and remote memory intact;     language  fluent;     normal attention, concentration,     fund of knowledge.  CRANIAL NERVES: CN II: Visual fields are full to confrontation. Fundoscopic exam is normal with sharp discs and no vascular changes. Pupil equal round reactive to light CN III, IV, VI: extraocular movement are normal. No ptosis. CN V: Facial sensation is intact to pinprick in all 3 divisions bilaterally. Corneal responses are intact.  CN VII: Face is symmetric with normal eye closure and smile. CN VIII: Hearing is normal to rubbing fingers CN IX, X: Palate elevates symmetrically. Phonation is normal. CN XI: Head turning and shoulder shrug are intact CN XII: Tongue is midline with normal movements and no atrophy.  MOTOR: There is no pronator drift of out-stretched arms. Muscle bulk and strength were normal, he has mild to moderate limb and nuchal rigidity,  REFLEXES: Reflexes are 2+ and symmetric at the biceps, triceps, knees, and ankles. Plantar responses are flexor.  SENSORY: Light touch, pinprick, position sense, and vibration sense are intact in fingers and toes.  COORDINATION: Rapid alternating movements and fine finger movements are intact. There is no dysmetria on finger-to-nose and heel-knee-shin. There are no abnormal or extraneous movements.   GAIT/STANCE: He has mild stooped forward posturing, decreased bilateral arm swing, mild retropulsed instability   DIAGNOSTIC DATA (LABS, IMAGING, TESTING) - I reviewed patient records, labs, notes, testing and imaging myself where available.  Lab Results  Component Value Date   WBC 7.0 01/07/2015   HGB 13.3 01/07/2015   HCT 39.4 01/07/2015   MCV 79.4 01/07/2015   PLT 241 01/07/2015      Component Value Date/Time   NA 137 01/07/2015 2200   K 3.8 01/07/2015 2200   CL 100 01/07/2015 2200   CO2 29 01/07/2015 2200   GLUCOSE 103 01/07/2015 2200   BUN 17 01/07/2015 2200   CREATININE 1.13 01/07/2015 2200   CALCIUM 9.2 01/07/2015 2200   PROT 7.3 01/07/2015  2200   ALBUMIN 4.1 01/07/2015 2200   AST 18 01/07/2015 2200   ALT 30 01/07/2015 2200   ALKPHOS 42 01/07/2015 2200   BILITOT 0.3 01/07/2015 2200   GFRNONAA >60 01/07/2015 2200   GFRAA >60 01/07/2015 2200   ASSESSMENT AND PLAN  Ruben Silva is a 44 y.o. male  With subacute onset progressive worsening mild parkinsonian features,  1. Central nervous system degenerative disorder, profound parkinsonian features, also complains of orthostatic symptoms, constipations, most consistent with multiple system atrophy, 2, try Sinemet 25/100 mg up to 2 tablets 3 times a day 3, complete evaluation with MRI of cervical spine, CT chest to rule out paraneoplastic syndrome, 4, refer her to academic movement specialist at [facility] 5, insomnia, clonazepam as needed 6, return to clinic in 2 months    Marcial Pacas, M.D. Ph.D.  [facility] [address] [place], [place] [address] Ph: [phone] Fax: [phone]

## 2015-03-06 ENCOUNTER — Telehealth: Payer: Self-pay | Admitting: *Deleted

## 2015-03-06 NOTE — Telephone Encounter (Signed)
-----   Message from Levert FeinsteinYijun [name], MD sent at 03/05/2015 11:17 PM EDT ----- Please call patient for normal laboratory result, includes normal CA125, VitD, parapneoplastic panel, VitB1, copper level, Vit E level is still pending.

## 2015-03-06 NOTE — Telephone Encounter (Signed)
Spoke to patient he is aware of results.

## 2015-03-07 LAB — VITAMIN D 1,25 DIHYDROXY
Vitamin D 1, 25 (OH)2 Total: 44 pg/mL
Vitamin D2 1, 25 (OH)2: <10 pg/mL
Vitamin D3 1, 25 (OH)2: 44 pg/mL

## 2015-03-07 LAB — PARANEOPLASTIC PROFILE 1

## 2015-03-07 LAB — VITAMIN E

## 2015-03-07 LAB — CERULOPLASMIN: CERULOPLASMIN: 23.4 mg/dL (ref 16.0–31.0)

## 2015-03-07 LAB — VITAMIN B1: Thiamine: 121.9 nmol/L (ref 66.5–200.0)

## 2015-03-07 LAB — COPPER, SERUM: COPPER: 95 ug/dL (ref 72–166)

## 2015-03-07 LAB — CA 125: CA 125: 3.6 U/mL (ref 0.0–38.1)

## 2015-03-08 ENCOUNTER — Ambulatory Visit
Admission: RE | Admit: 2015-03-08 | Discharge: 2015-03-08 | Disposition: A | Discharge status: Home / Self Care | Payer: 59 | Source: Ambulatory Visit | Attending: Neurology | Admitting: Neurology

## 2015-03-08 DIAGNOSIS — G20 Parkinson's disease: Secondary | ICD-10-CM

## 2015-03-08 DIAGNOSIS — R269 Unspecified abnormalities of gait and mobility: Secondary | ICD-10-CM

## 2015-03-08 DIAGNOSIS — G4700 Insomnia, unspecified: Secondary | ICD-10-CM

## 2015-03-11 ENCOUNTER — Telehealth: Payer: Self-pay | Admitting: *Deleted

## 2015-03-11 ENCOUNTER — Telehealth: Payer: Self-pay | Admitting: Neurology

## 2015-03-11 NOTE — Telephone Encounter (Signed)
Spoke to patient he is aware of results.

## 2015-03-11 NOTE — Telephone Encounter (Signed)
Patient is aware of normal CT chest.

## 2015-03-11 NOTE — Telephone Encounter (Signed)
Please call patient, MRI cervical showed mild degenerative disease, no significant abnormalities.  This is a mildly abnormal MRI of the cervical spine showing mild degenerative changes at C3-C4, C4-C5 and C5-C6 as detailed above that does not lead to any nerve root impingement. The spinal cord appears normal.

## 2015-04-02 ENCOUNTER — Encounter: Payer: Self-pay | Admitting: Neurology

## 2015-04-02 ENCOUNTER — Ambulatory Visit (INDEPENDENT_AMBULATORY_CARE_PROVIDER_SITE_OTHER): Payer: 59 | Admitting: Neurology

## 2015-04-02 VITALS — BP 116/79 | HR 62 | Ht 68.0 in | Wt 215.0 lb

## 2015-04-02 DIAGNOSIS — G4700 Insomnia, unspecified: Secondary | ICD-10-CM | POA: Diagnosis not present

## 2015-04-02 DIAGNOSIS — G20 Parkinson's disease: Secondary | ICD-10-CM

## 2015-04-02 DIAGNOSIS — R269 Unspecified abnormalities of gait and mobility: Secondary | ICD-10-CM | POA: Diagnosis not present

## 2015-04-02 MED ORDER — CARBIDOPA-LEVODOPA 25-100 MG PO TABS: ORAL_TABLET | ORAL | Status: DC

## 2015-04-02 MED ORDER — RASAGILINE MESYLATE 1 MG PO TABS: 1.0000 mg | ORAL_TABLET | Freq: Every day | ORAL | Status: DC

## 2015-04-02 NOTE — Progress Notes (Signed)
Chief Complaint  Patient presents with  . Parkinson-like movements    He is here to further discuss his MRI and CT results.  Feels Sinemet has been helpful with walking but he is still having tremors in his hands. Also, concerned about constipation and neck stiffness. He has an appointment with [facility] on 05/07/15.     Chief Complaint  Patient presents with  . Parkinson-like movements    He is here to further discuss his MRI and CT results.  Feels Sinemet has been helpful with walking but he is still having tremors in his hands. Also, concerned about constipation and neck stiffness. He has an appointment with [facility] on 05/07/15.        PATIENT: Ruben Silva DOB: 42-Aug-1972  Chief Complaint  Patient presents with  . Parkinson-like movements    He is here to further discuss his MRI and CT results.  Feels Sinemet has been helpful with walking but he is still having tremors in his hands. Also, concerned about constipation and neck stiffness. He has an appointment with [facility] on 05/07/15.      HISTORICAL  Ruben Silva 44 yo RH male, is accompanied by his wife, and his son at today's clinical visit, he is referred by his primary care physician Dr. 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, he has mild difficulty writing, complains of dizziness, especially with sudden positional change  UPDATE June 27th 2016: There was no family history of similar disease,  I have reviewed MRI brain with patient and his family, that was normal,  Laboratory showed normal or Negative TSH, ESR, C-reactive protein RPR HIV,  B12,CMP CBC, protein electrophoresis  Complains of slow worsening symptoms, increased gait difficulty, dizziness when getting up quickly, constipation, difficulty sleeping at nighttime, excessive daytime sleepiness fatigue  UPDATE August 2nd 2016: He started sinemet 25/100 one tid at 630am, 12, 6pm, he can walk better, no significant side effect. He complains of fatigue, lack of energy, he recently noticed right hand shaking   He has chronic constipation, taking magnesium citrate, miralax, he has orthostatic dizziness, he take clonazepam 0.46m prn for insomnia, which was helpful initially, wife reported patient acting out of dreams  He denied loss of sense of smell, he denies memory loss  We have reviewed MRI of cervical spine, mild degenerative disc disease, no significant canal or foraminal stenosis CAT scan of the chest was normal  Extensive laboratory evaluations, negative or normal, paraneoplastic panel, ceruloplasmin, copper level, vitamin B1, B12, vitamin D, CA 1161 RPR, HIV, folic acid, C-reactive protein, CPK, ESR, protein electrophoresis, Lyme titer, CBC, CMP  REVIEW OF SYSTEMS: Full 14 system review of systems performed and notable only for fatigue, murmur, blurry vision, constipation, joint pain, achy muscles, headaches, numbness, weakness, dizziness, tremor, sleepiness   ALLERGIES: No Known Allergies  HOME MEDICATIONS: Current Outpatient Prescriptions  Medication Sig Dispense Refill  . docusate sodium (COLACE) 100 MG capsule   3  . ibuprofen (ADVIL,MOTRIN) 200 MG tablet Take 200 mg by mouth every 6 (six) hours as needed.    . polyethylene glycol powder (GLYCOLAX/MIRALAX) powder     .  valsartan-hydrochlorothiazide (DIOVAN-HCT) 160-12.5 MG per tablet        PAST MEDICAL HISTORY: Past Medical History  Diagnosis Date  . Murmur   . Hypertension   . Bell's palsy   . Constipation   . Weakness generalized     PAST SURGICAL HISTORY: Past Surgical History  Procedure  Laterality Date  . No past surgeries      FAMILY HISTORY: Family History  Problem Relation Age of Onset  . Hypertension Mother   . Liver disease Mother   . Hypercholesterolemia Mother   . Stroke Father   . Hypertension Father   . Diabetes Father     SOCIAL HISTORY:  History   Social History  . Marital Status: Married    Spouse Name: N/A  . Number of Children: 1  . Years of Education: College   Occupational History  . Government social research officer    Social History Main Topics  . Smoking status: Never Smoker   . Smokeless tobacco: Not on file  . Alcohol Use: No  . Drug Use: No  . Sexual Activity: Not on file   Other Topics Concern  . Not on file   Social History Narrative   Lives at home with wife and son.   Right-hand.   Occasional use of caffeine.     PHYSICAL EXAM   Filed Vitals:   04/02/15 1600  BP: 116/79  Pulse: 62  Height: _0  (1.727 m)  Weight: 215 lb (97.523 kg)    Not recorded      Body mass index is 32.7 kg/(m^2).  PHYSICAL EXAMNIATION:  Gen: NAD, conversant, well nourised, obese, well groomed                     Cardiovascular: Regular rate rhythm, no peripheral edema, warm, nontender. Eyes: Conjunctivae clear without exudates or hemorrhage Neck: Supple, no carotid bruise. Pulmonary: Clear to auscultation bilaterally   NEUROLOGICAL EXAM:  MENTAL STATUS: decreased facial expression, mild masked face, Speech:    Speech is normal; fluent and spontaneous with normal comprehension.  Cognition:    The patient is oriented to person, place, and time;     recent and remote memory intact;     language fluent;     normal attention, concentration,     fund of knowledge.  CRANIAL NERVES: CN II: Visual fields are full to confrontation. Fundoscopic exam is normal with sharp discs and no vascular changes. Pupil equal round reactive to light CN III, IV, VI: extraocular movement are normal. No ptosis. CN V: Facial sensation is intact to pinprick in all  3 divisions bilaterally. Corneal responses are intact.  CN VII: Face is symmetric with normal eye closure and smile. CN VIII: Hearing is normal to rubbing fingers CN IX, X: Palate elevates symmetrically. Phonation is normal. CN XI: Head turning and shoulder shrug are intact CN XII: Tongue is midline with normal movements and no atrophy.  MOTOR: There is no pronator drift of out-stretched arms. Muscle bulk and strength were normal, he has mild to moderate limb and nuchal rigidity, fairly symmetric  REFLEXES: Reflexes are 2+ and symmetric at the biceps, triceps, knees, and ankles. Plantar responses are flexor.  SENSORY: Light touch, pinprick, position sense, and vibration sense are intact in fingers and toes.  COORDINATION: Rapid alternating movements and fine finger movements are intact. There is no dysmetria on finger-to-nose and heel-knee-shin.   GAIT/STANCE: He has mild stooped forward posturing, decreased bilateral arm swing, mild retropulsed instability  DIAGNOSTIC DATA (LABS, IMAGING, TESTING) - I reviewed patient records, labs, notes, testing and imaging myself where available.  Lab Results  Component Value Date   WBC 7.0 01/07/2015   HGB 13.3 01/07/2015   HCT 39.4 01/07/2015   MCV 79.4 01/07/2015   PLT 241 01/07/2015      Component Value Date/Time   NA 137 01/07/2015 2200   K 3.8 01/07/2015 2200   CL 100 01/07/2015 2200   CO2 29 01/07/2015 2200   GLUCOSE 103 01/07/2015 2200   BUN 17 01/07/2015 2200   CREATININE 1.13 01/07/2015 2200   CALCIUM 9.2 01/07/2015 2200   PROT 7.3 01/07/2015 2200   ALBUMIN 4.1 01/07/2015 2200   AST 18 01/07/2015 2200   ALT 30 01/07/2015 2200   ALKPHOS 42 01/07/2015 2200   BILITOT 0.3 01/07/2015 2200   GFRNONAA >60 01/07/2015 2200   GFRAA >60 01/07/2015 2200   ASSESSMENT AND PLAN  Ruben OSHITA is a 44 y.o. 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 Ph.D.  [facility] [address], [place], [place] [address] Ph: [phone] Fax: [phone]

## 2015-06-03 ENCOUNTER — Emergency Department (HOSPITAL_BASED_OUTPATIENT_CLINIC_OR_DEPARTMENT_OTHER): Payer: 59

## 2015-06-03 ENCOUNTER — Emergency Department (HOSPITAL_BASED_OUTPATIENT_CLINIC_OR_DEPARTMENT_OTHER)
Admission: EM | Admit: 2015-06-03 | Discharge: 2015-06-04 | Disposition: A | Discharge status: Home / Self Care | Payer: 59 | Attending: Emergency Medicine | Admitting: Emergency Medicine

## 2015-06-03 ENCOUNTER — Encounter (HOSPITAL_BASED_OUTPATIENT_CLINIC_OR_DEPARTMENT_OTHER): Payer: Self-pay

## 2015-06-03 DIAGNOSIS — K5900 Constipation, unspecified: Secondary | ICD-10-CM | POA: Diagnosis not present

## 2015-06-03 DIAGNOSIS — G908 Other disorders of autonomic nervous system: Secondary | ICD-10-CM | POA: Insufficient documentation

## 2015-06-03 DIAGNOSIS — R55 Syncope and collapse: Secondary | ICD-10-CM | POA: Insufficient documentation

## 2015-06-03 DIAGNOSIS — I10 Essential (primary) hypertension: Secondary | ICD-10-CM | POA: Diagnosis not present

## 2015-06-03 DIAGNOSIS — K5909 Other constipation: Secondary | ICD-10-CM

## 2015-06-03 DIAGNOSIS — R011 Cardiac murmur, unspecified: Secondary | ICD-10-CM | POA: Diagnosis not present

## 2015-06-03 DIAGNOSIS — G20 Parkinson's disease: Secondary | ICD-10-CM | POA: Diagnosis not present

## 2015-06-03 DIAGNOSIS — G909 Disorder of the autonomic nervous system, unspecified: Secondary | ICD-10-CM

## 2015-06-03 HISTORY — DX: Parkinson's disease: G20

## 2015-06-03 HISTORY — DX: Parkinson's disease without dyskinesia, without mention of fluctuations: G20.A1

## 2015-06-03 LAB — CBC WITH DIFFERENTIAL/PLATELET
BASOS ABS: 0 10*3/uL (ref 0.0–0.1)
BASOS PCT: 0 %
Eosinophils Absolute: 0.1 10*3/uL (ref 0.0–0.7)
Eosinophils Relative: 2 %
HEMATOCRIT: 41.2 % (ref 39.0–52.0)
HEMOGLOBIN: 13.9 g/dL (ref 13.0–17.0)
Lymphocytes Relative: 35 %
Lymphs Abs: 2.4 10*3/uL (ref 0.7–4.0)
MCH: 26.6 pg (ref 26.0–34.0)
MCHC: 33.7 g/dL (ref 30.0–36.0)
MCV: 78.8 fL (ref 78.0–100.0)
Monocytes Absolute: 0.7 10*3/uL (ref 0.1–1.0)
Monocytes Relative: 10 %
NEUTROS ABS: 3.6 10*3/uL (ref 1.7–7.7)
NEUTROS PCT: 53 %
Platelets: 250 10*3/uL (ref 150–400)
RBC: 5.23 MIL/uL (ref 4.22–5.81)
RDW: 13.3 % (ref 11.5–15.5)
WBC: 6.8 10*3/uL (ref 4.0–10.5)

## 2015-06-03 LAB — BASIC METABOLIC PANEL
ANION GAP: 7 (ref 5–15)
BUN: 14 mg/dL (ref 6–20)
CALCIUM: 9.2 mg/dL (ref 8.9–10.3)
CHLORIDE: 102 mmol/L (ref 101–111)
CO2: 30 mmol/L (ref 22–32)
Creatinine, Ser: 1.03 mg/dL (ref 0.61–1.24)
GFR calc non Af Amer: >60 mL/min (ref >60)
Glucose, Bld: 113 mg/dL — ABNORMAL HIGH (ref 65–99)
Potassium: 3.5 mmol/L (ref 3.5–5.1)
Sodium: 139 mmol/L (ref 135–145)

## 2015-06-03 LAB — TROPONIN I: Troponin I: <0.03 ng/mL (ref <0.031)

## 2015-06-03 MED ORDER — SENNOSIDES-DOCUSATE SODIUM 8.6-50 MG PO TABS: ORAL_TABLET | ORAL | Status: DC

## 2015-06-03 NOTE — Progress Notes (Signed)
Patient's Lactic Acid results are not crossing over at this time.  Patient's Lactic Acid result at 22:46 is 0.75 mmol/L.

## 2015-06-03 NOTE — ED Provider Notes (Signed)
CSN: [id number]     Arrival date & time 06/03/15  2204 History   First MD Initiated Contact with Patient 06/03/15 2214     Chief Complaint  Patient presents with  . Loss of Consciousness     (Consider location/radiation/quality/duration/timing/severity/associated sxs/prior Treatment) HPI   Blood pressure 118/76, pulse 72, temperature 98.2 F (36.8 C), temperature source Oral, resp. rate 18, height  (1.727 m), weight 212 lb (96.163 kg), SpO2 99 %.  Ruben Silva is a 44 y.o. male with past medical history significant for Parkinson's, history supplied by patient and his wife. Patient states he was standing up from laying down on the bed, he felt lightheaded and lost consciousness. Patient's wife was very close to him in the bathroom washing her hands. She heard a thud and immediately went to him. States that he was not conscious, his 50 year old son came in and was trying to get him up off the floor. Patient states the first thing he remembers is his son trying to lift him up off the ground. As per wife he was not responding appropriately to 45 minutes after the event. There was no tonic-clonic movement, no incontinence. Patient has never syncopized before. He has no history of seizure disorder. States that his head may have hit the fan there was no significant head trauma. Started Azilect~1 week ago. Eating and drinking normally. Patient denies chest pain, cough, shortness of breath, nausea, vomiting. He endorses his chronic constipation which is unchanged no melena or hematochezia. Patient also notes a right lower extremity cold sensation which is chronic for him and as per wife states that this is part of his issues with Parkinson's.  Patient's neurologist is both at [facility] and he sees [facility] neurology as well.   Past Medical History  Diagnosis Date  . Murmur   . Hypertension   . Bell's palsy   . Constipation   . Weakness generalized   . Parkinsons [facility])    Past Surgical  History  Procedure Laterality Date  . No past surgeries     Family History  Problem Relation Age of Onset  . Hypertension Mother   . Liver disease Mother   . Hypercholesterolemia Mother   . Stroke Father   . Hypertension Father   . Diabetes Father    Social History  Substance Use Topics  . Smoking status: Never Smoker   . Smokeless tobacco: None  . Alcohol Use: No    Review of Systems  10 systems reviewed and found to be negative, except as noted in the HPI.   Allergies  Review of patient's allergies indicates no known allergies.  Home Medications   Prior to Admission medications   Medication Sig Start Date End Date Taking? Authorizing Provider  carbidopa-levodopa (SINEMET IR) 25-100 MG per tablet One tab four times a day 04/02/15   Levert Feinstein, MD  clonazePAM (KLONOPIN) 0.5 MG tablet 1 to 2 tabs po qhs 02/25/15   Levert Feinstein, MD  docusate sodium (COLACE) 100 MG capsule  01/04/15   Historical Provider, MD  ibuprofen (ADVIL,MOTRIN) 200 MG tablet Take 200 mg by mouth every 6 (six) hours as needed.    Historical Provider, MD  polyethylene glycol powder (GLYCOLAX/MIRALAX) powder  01/21/15   Historical Provider, MD  rasagiline (AZILECT) 1 MG TABS tablet Take 1 tablet (1 mg total) by mouth daily. 04/02/15   Levert Feinstein, MD  senna-docusate (SENOKOT-S) 8.6-50 MG tablet Start 1 tab QHS, up to 2tabs BID. Max 4 tabs daily  for constipation 06/03/15   Joni Reining Zaydyn Havey, PA-C  valsartan-hydrochlorothiazide (DIOVAN-HCT) 160-12.5 MG per tablet  01/30/15   Historical Provider, MD   BP 107/70 mmHg  Pulse 55  Temp(Src) 98.2 F (36.8 C) (Oral)  Resp 18  Ht  (1.727 m)  Wt 212 lb (96.163 kg)  BMI 32.24 kg/m2  SpO2 98% Physical Exam  Constitutional: He is oriented to person, place, and time. He appears well-developed and well-nourished. No distress.  HENT:  Head: Normocephalic.  Mouth/Throat: Oropharynx is clear and moist.  Eyes: Conjunctivae and EOM are normal. Pupils are equal, round, and  reactive to light.  Neck: Normal range of motion.  Cardiovascular: Normal rate, regular rhythm and intact distal pulses.   Pulmonary/Chest: Effort normal and breath sounds normal. No stridor. No respiratory distress. He has no wheezes. He has no rales. He exhibits no tenderness.  Abdominal: Soft. Bowel sounds are normal. He exhibits no distension and no mass. There is no tenderness. There is no rebound and no guarding.  Musculoskeletal: Normal range of motion.  Neurological: He is alert and oriented to person, place, and time.  Masked facies.   Greatly reduced muscle tone patient needs assistance in all movements.  Psychiatric: He has a normal mood and affect.  Nursing note and vitals reviewed.   ED Course  Procedures (including critical care time) Labs Review Labs Reviewed  BASIC METABOLIC PANEL - Abnormal; Notable for the following:    Glucose, Bld 113 (*)    All other components within normal limits  CBC WITH DIFFERENTIAL/PLATELET  TROPONIN I  I-STAT CG4 LACTIC ACID, ED    Imaging Review Ct Head Wo Contrast  06/03/2015   CLINICAL DATA:  Acute onset of headache and dizziness. Loss of consciousness. Found on floor. Initial encounter.  EXAM: CT HEAD WITHOUT CONTRAST  TECHNIQUE: Contiguous axial images were obtained from the base of the skull through the vertex without intravenous contrast.  COMPARISON:  CT of the head performed 12/10/2014, and MRI of the brain performed 02/07/2015  FINDINGS: There is no evidence of acute infarction, mass lesion, or intra- or extra-axial hemorrhage on CT.  The posterior fossa, including the cerebellum, brainstem and fourth ventricle, is within normal limits. The third and lateral ventricles, and basal ganglia are unremarkable in appearance. The cerebral hemispheres are symmetric in appearance, with normal gray-white differentiation. No mass effect or midline shift is seen.  There is no evidence of fracture; visualized osseous structures are unremarkable in  appearance. The visualized portions of the orbits are within normal limits. The paranasal sinuses and mastoid air cells are well-aerated. No significant soft tissue abnormalities are seen.  IMPRESSION: Unremarkable noncontrast CT of the head.   Electronically Signed   By: Roanna Raider M.D.   On: 06/03/2015 23:39   Dg Chest Port 1 View  06/03/2015   CLINICAL DATA:  Headache and lightheadedness.  Unwitnessed fall.  EXAM: PORTABLE CHEST 1 VIEW  COMPARISON:  12/10/2014  FINDINGS: There is mild unchanged right hemidiaphragm elevation. The lungs are clear. The pulmonary vasculature is grossly normal. No large effusion is evident.  IMPRESSION: No acute cardiopulmonary findings.   Electronically Signed   By: Ellery Plunk M.D.   On: 06/03/2015 23:07   I have personally reviewed and evaluated these images and lab results as part of my medical decision-making.   EKG Interpretation None      MDM   Final diagnoses:  Syncope and collapse  Autonomic dysfunction  Chronic constipation    Filed Vitals:  06/03/15 2214 06/03/15 2307 06/03/15 2330 06/04/15 0000  BP: 118/76 113/62 112/72 107/70  Pulse: 72 66 64 55  Temp: 98.2 F (36.8 C)     TempSrc: Oral     Resp: 18 18    Height:  (1.727 m)     Weight: 212 lb (96.163 kg)     SpO2: 99% 99% 95% 98%    Ruben Silva is a pleasant 44 y.o. male presenting with possible syncopal event. As per his wife he was not mentating normally for approximately 45 minutes, this may be in a postictal state. Patient did not have tonic-clonic movement, no incontinence. There was a very mild head trauma, no objective signs of trauma. As per his wife he is mentating normally. This might be secondary to autonomic dysfunction from his Parkinson's. Orthostatic vital signs show some autonomic dysfunction. Head CT negative, EKG with no arrhythmia. Blood work reassuring, chest x-ray with no infiltrate. Advise close follow-up with neurology.  Evaluation does not  show pathology that would require ongoing emergent intervention or inpatient treatment. Pt is hemodynamically stable and mentating appropriately. Discussed findings and plan with patient/guardian, who agrees with care plan. All questions answered. Return precautions discussed and outpatient follow up given.   New Prescriptions   SENNA-DOCUSATE (SENOKOT-S) 8.6-50 MG TABLET    Start 1 tab QHS, up to 2tabs BID. Max 4 tabs daily for constipation        Wynetta Emery, PA-C 06/04/15 0007  Wynetta Emery, PA-C 06/04/15 0009  Gwyneth Sprout, MD 06/06/15 [phone]

## 2015-06-03 NOTE — ED Notes (Addendum)
C/o HA today-dizziness since dx of parkinson in June-wife states pt did have LOC approx 45 min PTA-she heard a loud noise and pt was found on the floor after he was standing-she and son helped get him in the bed and dressed-pt was initially disoriented but is now A/O

## 2015-06-03 NOTE — ED Notes (Signed)
Per wife pt passed out 45 minutes pta,  Pt also c/o dizziness and ha

## 2015-06-03 NOTE — Discharge Instructions (Signed)
Please follow with your primary care doctor in the next 2 days for a check-up. They must obtain records for further management.  ° °Do not hesitate to return to the Emergency Department for any new, worsening or concerning symptoms.  ° °

## 2015-06-04 LAB — I-STAT CG4 LACTIC ACID, ED: Lactic Acid, Venous: 0.75 mmol/L (ref 0.5–2.0)

## 2015-06-11 ENCOUNTER — Ambulatory Visit: Payer: 59 | Admitting: Neurology

## 2015-06-17 ENCOUNTER — Ambulatory Visit (INDEPENDENT_AMBULATORY_CARE_PROVIDER_SITE_OTHER): Payer: 59 | Admitting: Neurology

## 2015-06-17 ENCOUNTER — Encounter: Payer: Self-pay | Admitting: Neurology

## 2015-06-17 VITALS — BP 120/75 | HR 72 | Ht 68.0 in | Wt 215.0 lb

## 2015-06-17 DIAGNOSIS — G20 Parkinson's disease: Secondary | ICD-10-CM

## 2015-06-17 MED ORDER — ROPINIROLE HCL ER 2 MG PO TB24: ORAL_TABLET | ORAL | Status: DC

## 2015-06-17 NOTE — Progress Notes (Signed)
PATIENT: Ruben Silva DOB: June 06, 1971  No chief complaint on file.   HISTORICAL  Ruben Silva 44 yo RH male, is accompanied by his wife, and his son at today's clinical visit, he is referred by his primary care physician Dr. 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, he has mild difficulty writing, complains of dizziness, especially with sudden positional change  UPDATE June 27th 2016: There was no family history of similar disease,  I have reviewed MRI brain with patient and his family, that was normal,  Laboratory showed normal or Negative TSH, ESR, C-reactive protein RPR HIV, B12,CMP CBC, protein electrophoresis  Complains of slow worsening symptoms, increased gait difficulty, dizziness when getting up quickly, constipation, difficulty sleeping at nighttime, excessive daytime sleepiness fatigue  UPDATE August 2nd 2016: He started sinemet 25/100 one tid at 630am, 12, 6pm, he can walk better, no significant side effect. He complains of fatigue, lack of energy, he recently noticed right hand shaking   He has chronic constipation, taking magnesium citrate, miralax, he has orthostatic dizziness, he take clonazepam 0.$RemoveBeforeDEI'5mg'aonyiDttaeQmMhEn$  prn for insomnia, which was helpful initially, wife reported patient acting out of dreams  He denied loss of sense of smell, he denies memory loss  We have reviewed MRI of cervical spine, mild degenerative disc disease, no significant canal or foraminal stenosis CAT scan of the chest was normal  Extensive laboratory evaluations, negative or normal, paraneoplastic panel,  ceruloplasmin, copper level, vitamin B1, B12, vitamin D, CA 275, RPR, HIV, folic acid, C-reactive protein, CPK, ESR, protein electrophoresis, Lyme titer, CBC, CMP  UPDATE Jun 17 2015: He was [facility] by [facility] movement specialist Dr. Linus Mako in September 2016, concurred with the diagnosis of early-onset idiopathic Parkinson's disease, he is now taking Sinemet 25/$RemoveBeforeDEI'100mg'iUDdnXaikhfGtUDI$  2 tablets 3 times a day, he was started on Azilect at the end of September, only took few doses, could not tolerated due to lightheadedness, dizziness,  He had one passing out episode June 03 2015, he has been complaining dizziness all day long after taking Azilect in the morning, getting worse after dinner around 8 PM, when he tried to stand up from seated position, he complains of worsening dizziness, landed on the floor, may be transient loss of consciousness, he was taken to the emergency room,  I personally reviewed CAT brain scan without contrast, no acute abnormality, laboratory showed normal CBC, BMP with exception of mild elevated glucose 113  He now complains of constant left shoulder pain,  REVIEW OF SYSTEMS: Full 14 system review of systems performed and notable only for fatigue, murmur, blurry vision, constipation, joint pain, achy muscles, headaches, numbness, weakness, dizziness, tremor, sleepiness   ALLERGIES: No Known Allergies  HOME MEDICATIONS: Current Outpatient Prescriptions  Medication Sig Dispense Refill  . docusate sodium (COLACE) 100 MG capsule   3  . ibuprofen (ADVIL,MOTRIN) 200 MG tablet Take 200 mg by mouth every 6 (six) hours as needed.    . polyethylene glycol powder (GLYCOLAX/MIRALAX) powder     . valsartan-hydrochlorothiazide (DIOVAN-HCT) 160-12.5 MG per tablet        PAST MEDICAL HISTORY:  Past Medical History  Diagnosis Date  . Murmur   . Hypertension   . Bell's palsy   . Constipation   . Weakness generalized     PAST SURGICAL HISTORY: Past Surgical History  Procedure Laterality  Date  . No past surgeries      FAMILY HISTORY: Family History  Problem Relation Age of Onset  . Hypertension Mother   . Liver disease Mother   . Hypercholesterolemia Mother   . Stroke Father   . Hypertension Father   . Diabetes Father     SOCIAL HISTORY:  History   Social History  . Marital Status: Married    Spouse Name: N/A  . Number of Children: 1  . Years of Education: College   Occupational History  . Emergency planning/management officer    Social History Main Topics  . Smoking status: Never Smoker   . Smokeless tobacco: Not on file  . Alcohol Use: No  . Drug Use: No  . Sexual Activity: Not on file   Other Topics Concern  . Not on file   Social History Narrative   Lives at home with wife and son.   Right-hand.   Occasional use of caffeine.     PHYSICAL EXAM   There were no vitals filed for this visit.  Not recorded      There is no weight on file to calculate BMI.  PHYSICAL EXAMNIATION:  Gen: NAD, conversant, well nourised, obese, well groomed                     Cardiovascular: Regular rate rhythm, no peripheral edema, warm, nontender. Eyes: Conjunctivae clear without exudates or hemorrhage Neck: Supple, no carotid bruise. Pulmonary: Clear to auscultation bilaterally   NEUROLOGICAL EXAM:  MENTAL STATUS: decreased facial expression, mild masked face, Speech:    Speech is normal; fluent and spontaneous with normal comprehension.  Cognition:    The patient is oriented to person, place, and time;     recent and remote memory intact;     language fluent;     normal attention, concentration,     fund of knowledge.  CRANIAL NERVES: CN II: Visual fields are full to confrontation. Fundoscopic exam is normal with sharp discs and no vascular changes. Pupil equal round reactive to light CN III, IV, VI: extraocular movement are normal. No ptosis. CN V: Facial sensation is intact to pinprick in all 3 divisions bilaterally. Corneal responses are intact.  CN VII: Face  is symmetric with normal eye closure and smile. CN VIII: Hearing is normal to rubbing fingers CN IX, X: Palate elevates symmetrically. Phonation is normal. CN XI: Head turning and shoulder shrug are intact CN XII: Tongue is midline with normal movements and no atrophy.  MOTOR: There is no pronator drift of out-stretched arms. Muscle bulk and strength were normal, he has mild to moderate limb and nuchal rigidity, fairly symmetric  REFLEXES: Reflexes are 2+ and symmetric at the biceps, triceps, knees, and ankles. Plantar responses are flexor.  SENSORY: Light touch, pinprick, position sense, and vibration sense are intact in fingers and toes.  COORDINATION: Rapid alternating movements and fine finger movements are intact. There is no dysmetria on finger-to-nose and heel-knee-shin.   GAIT/STANCE: He has mild stooped forward posturing, decreased bilateral arm swing, left worse than right mild retropulsed instability   DIAGNOSTIC DATA (LABS, IMAGING, TESTING) - I reviewed patient records, labs, notes, testing and imaging myself where available.  Lab Results  Component Value Date  WBC 6.8 06/03/2015   HGB 13.9 06/03/2015   HCT 41.2 06/03/2015   MCV 78.8 06/03/2015   PLT 250 06/03/2015      Component Value Date/Time   NA 137 01/07/2015 2200   K 3.8 01/07/2015 2200   CL 100 01/07/2015 2200   CO2 29 01/07/2015 2200   GLUCOSE 103 01/07/2015 2200   BUN 17 01/07/2015 2200   CREATININE 1.13 01/07/2015 2200   CALCIUM 9.2 01/07/2015 2200   PROT 7.3 01/07/2015 2200   ALBUMIN 4.1 01/07/2015 2200   AST 18 01/07/2015 2200   ALT 30 01/07/2015 2200   ALKPHOS 42 01/07/2015 2200   BILITOT 0.3 01/07/2015 2200   GFRNONAA >60 01/07/2015 2200   GFRAA >60 01/07/2015 2200   ASSESSMENT AND PLAN  Ruben Silva is a 44 y.o. male    Early onset idiopathic Parkinson's disease  Continue Sinemet 25/100 mg 2 tablets twice a day  He could not tolerate Azilect complains of dizziness  Starting  Requip XL 2 mg, titrating to 3 tablets every night  Return to clinic in one month Orthostatic dizziness  Encouraging moderate exercise, increase water intake  Marcial Pacas, M.D. Ph.D.  [facility] [address], [place], [place] [address] Ph: [phone] Fax: [phone]

## 2015-07-04 ENCOUNTER — Ambulatory Visit: Payer: 59 | Admitting: Neurology

## 2015-07-07 ENCOUNTER — Encounter (HOSPITAL_BASED_OUTPATIENT_CLINIC_OR_DEPARTMENT_OTHER): Payer: Self-pay | Admitting: Emergency Medicine

## 2015-07-07 ENCOUNTER — Emergency Department (HOSPITAL_BASED_OUTPATIENT_CLINIC_OR_DEPARTMENT_OTHER)
Admission: EM | Admit: 2015-07-07 | Discharge: 2015-07-07 | Disposition: A | Discharge status: Home / Self Care | Payer: 59 | Attending: Emergency Medicine | Admitting: Emergency Medicine

## 2015-07-07 DIAGNOSIS — I10 Essential (primary) hypertension: Secondary | ICD-10-CM | POA: Diagnosis not present

## 2015-07-07 DIAGNOSIS — M79652 Pain in left thigh: Secondary | ICD-10-CM | POA: Diagnosis present

## 2015-07-07 DIAGNOSIS — M25512 Pain in left shoulder: Secondary | ICD-10-CM

## 2015-07-07 DIAGNOSIS — M5432 Sciatica, left side: Secondary | ICD-10-CM | POA: Diagnosis not present

## 2015-07-07 DIAGNOSIS — Z79899 Other long term (current) drug therapy: Secondary | ICD-10-CM | POA: Diagnosis not present

## 2015-07-07 DIAGNOSIS — K5900 Constipation, unspecified: Secondary | ICD-10-CM | POA: Insufficient documentation

## 2015-07-07 DIAGNOSIS — G510 Bell's palsy: Secondary | ICD-10-CM | POA: Diagnosis not present

## 2015-07-07 DIAGNOSIS — G20 Parkinson's disease: Secondary | ICD-10-CM | POA: Insufficient documentation

## 2015-07-07 DIAGNOSIS — R011 Cardiac murmur, unspecified: Secondary | ICD-10-CM | POA: Insufficient documentation

## 2015-07-07 NOTE — ED Provider Notes (Signed)
CSN: [id number]     Arrival date & time 07/07/15  0718 History   First MD Initiated Contact with Patient 07/07/15 [phone]     Chief Complaint  Patient presents with  . Shoulder Pain  . Leg Pain     (Consider location/radiation/quality/duration/timing/severity/associated sxs/prior Treatment) Patient is a 44 y.o. male presenting with shoulder pain and leg pain.  Shoulder Pain Upper extremity pain location: L scapula. Time since incident:  2 weeks Injury: no   Pain details:    Quality:  Aching   Radiates to:  Does not radiate   Severity:  Moderate   Onset quality:  Gradual   Timing:  Constant   Progression:  Unchanged Chronicity:  New Foreign body present:  No foreign bodies Prior injury to area:  No Relieved by:  Nothing Worsened by:  Movement and bearing weight Associated symptoms: stiffness   Associated symptoms: no back pain, no decreased range of motion, no fever, no muscle weakness and no swelling   Leg Pain Lower extremity pain location: L thigh. Time since incident: months. Injury: no   Pain details:    Quality:  Aching   Radiates to:  Does not radiate   Severity:  Moderate   Onset quality:  Gradual   Timing:  Constant   Progression:  Worsening Foreign body present:  No foreign bodies Prior injury to area:  No Relieved by:  Nothing Worsened by:  Flexion, adduction, abduction, bearing weight and extension Associated symptoms: stiffness   Associated symptoms: no back pain, no decreased ROM, no fever and no muscle weakness     Past Medical History  Diagnosis Date  . Murmur   . Hypertension   . Bell's palsy   . Constipation   . Weakness generalized   . Parkinsons [facility])    Past Surgical History  Procedure Laterality Date  . No past surgeries     Family History  Problem Relation Age of Onset  . Hypertension Mother   . Liver disease Mother   . Hypercholesterolemia Mother   . Stroke Father   . Hypertension Father   . Diabetes Father    Social History   Substance Use Topics  . Smoking status: Never Smoker   . Smokeless tobacco: None  . Alcohol Use: No    Review of Systems  Constitutional: Negative for fever.  Musculoskeletal: Positive for stiffness. Negative for back pain.  All other systems reviewed and are negative.     Allergies  Review of patient's allergies indicates no known allergies.  Home Medications   Prior to Admission medications   Medication Sig Start Date End Date Taking? Authorizing Provider  carbidopa-levodopa (SINEMET IR) 25-100 MG per tablet One tab four times a day 04/02/15   Levert Feinstein, MD  clonazePAM (KLONOPIN) 0.5 MG tablet 1 to 2 tabs po qhs 02/25/15   Levert Feinstein, MD  docusate sodium (COLACE) 100 MG capsule  01/04/15   Historical Provider, MD  ibuprofen (ADVIL,MOTRIN) 200 MG tablet Take 200 mg by mouth every 6 (six) hours as needed.    Historical Provider, MD  polyethylene glycol powder (GLYCOLAX/MIRALAX) powder  01/21/15   Historical Provider, MD  rOPINIRole (REQUIP XL) 2 MG 24 hr tablet One po qhs xone week, then 2 tabs po qhs x one week, then 3 tabs po qhs. 06/17/15   Levert Feinstein, MD  senna-docusate (SENOKOT-S) 8.6-50 MG tablet Start 1 tab QHS, up to 2tabs BID. Max 4 tabs daily for constipation 06/03/15   Wynetta Emery, PA-C  valsartan-hydrochlorothiazide (DIOVAN-HCT) 160-12.5 MG per tablet  01/30/15   Historical Provider, MD   BP 139/87 mmHg  Pulse 72  Temp(Src) 98.5 F (36.9 C) (Oral)  Resp 18  Ht 5\' 8"  (1.727 m)  Wt 180 lb (81.647 kg)  BMI 27.38 kg/m2  SpO2 100% Physical Exam  Constitutional: He is oriented to person, place, and time. He appears well-developed and well-nourished.  HENT:  Head: Normocephalic and atraumatic.  Eyes: Conjunctivae and EOM are normal.  Neck: Normal range of motion. Neck supple.  Cardiovascular: Normal rate, regular rhythm and normal heart sounds.   Pulmonary/Chest: Effort normal and breath sounds normal. No respiratory distress.  Abdominal: He exhibits no distension.  There is no tenderness. There is no rebound and no guarding.  Musculoskeletal: Normal range of motion.       Left shoulder: He exhibits tenderness (scapular) and pain. He exhibits normal range of motion.       Lumbar back: He exhibits tenderness. He exhibits no bony tenderness.  NV intact throughout  Neurological: He is alert and oriented to person, place, and time.  Skin: Skin is warm and dry.  Vitals reviewed.   ED Course  Procedures (including critical care time) Labs Review Labs Reviewed - No data to display  Imaging Review No results found. I have personally reviewed and evaluated these images and lab results as part of my medical decision-making.   EKG Interpretation None      MDM   Final diagnoses:  None    44 y.o. male with pertinent PMH of parkinsons presents with atraumatic L shoulder and L thigh pain.  Symptoms present for weeks, no acute changes.  No calf tenderness, swelling, or pain.  Shoulder pain consistent with msk etiology, worse with palpation and movement.  Thigh pain in lateral femoral cutaneous nerve distribution, also with paraspinal lumbar tenderness, consistent with likely sciatic pain.  DC home in stable condition.    I have reviewed all laboratory and imaging studies if ordered as above  1. Sciatica of left side   2. Left shoulder pain         Mirian MoMatthew Gentry, MD 07/07/15 [phone]

## 2015-07-07 NOTE — ED Notes (Signed)
Pt here for left shoulder and left thigh/hip pain x2 weeks. Pt denies injury, has full ROM.  Pt thinks he slept on his shoulder wrong.  Pt alert and oriented.

## 2015-07-07 NOTE — Discharge Instructions (Signed)
Radicular Pain °Radicular pain in either the arm or leg is usually from a bulging or herniated disk in the spine. A piece of the herniated disk may press against the nerves as the nerves exit the spine. This causes pain which is felt at the tips of the nerves down the arm or leg. Other causes of radicular pain may include: °· Fractures. °· Heart disease. °· Cancer. °· An abnormal and usually degenerative state of the nervous system or nerves (neuropathy). °Diagnosis may require CT or MRI scanning to determine the primary cause.  °Nerves that start at the neck (nerve roots) may cause radicular pain in the outer shoulder and arm. It can spread down to the thumb and fingers. The symptoms vary depending on which nerve root has been affected. In most cases radicular pain improves with conservative treatment. Neck problems may require physical therapy, a neck collar, or cervical traction. Treatment may take many weeks, and surgery may be considered if the symptoms do not improve.  °Conservative treatment is also recommended for sciatica. Sciatica causes pain to radiate from the lower back or buttock area down the leg into the foot. Often there is a history of back problems. Most patients with sciatica are better after 2 to 4 weeks of rest and other supportive care. Short term bed rest can reduce the disk pressure considerably. Sitting, however, is not a good position since this increases the pressure on the disk. You should avoid bending, lifting, and all other activities which make the problem worse. Traction can be used in severe cases. Surgery is usually reserved for patients who do not improve within the first months of treatment. °Only take over-the-counter or prescription medicines for pain, discomfort, or fever as directed by your caregiver. Narcotics and muscle relaxants may help by relieving more severe pain and spasm and by providing mild sedation. Cold or massage can give significant relief. Spinal manipulation  is not recommended. It can increase the degree of disc protrusion. Epidural steroid injections are often effective treatment for radicular pain. These injections deliver medicine to the spinal nerve in the space between the protective covering of the spinal cord and back bones (vertebrae). Your caregiver can give you more information about steroid injections. These injections are most effective when given within two weeks of the onset of pain.  °You should see your caregiver for follow up care as recommended. A program for neck and back injury rehabilitation with stretching and strengthening exercises is an important part of management.  °SEEK IMMEDIATE MEDICAL CARE IF: °· You develop increased pain, weakness, or numbness in your arm or leg. °· You develop difficulty with bladder or bowel control. °· You develop abdominal pain. °  °This information is not intended to replace advice given to you by your health care provider. Make sure you discuss any questions you have with your health care provider. °  °Document Released: 09/24/2004 Document Revised: 09/07/2014 Document Reviewed: 03/13/2015 °Elsevier Interactive Patient Education ©2016 [employer] ° °

## 2015-07-07 NOTE — ED Notes (Signed)
Pt made aware to return if symptoms worsen or if any life threatening symptoms occur.   

## 2015-07-07 NOTE — ED Notes (Signed)
Dr. Gentry at bedside. 

## 2015-07-15 ENCOUNTER — Ambulatory Visit: Payer: 59 | Admitting: Neurology

## 2015-07-15 ENCOUNTER — Telehealth: Payer: Self-pay | Admitting: *Deleted

## 2015-07-15 NOTE — Telephone Encounter (Addendum)
Called to cancel the morning of his scheduled appointment - not well.

## 2015-07-30 ENCOUNTER — Emergency Department (HOSPITAL_BASED_OUTPATIENT_CLINIC_OR_DEPARTMENT_OTHER)
Admission: EM | Admit: 2015-07-30 | Discharge: 2015-07-30 | Disposition: A | Discharge status: Home / Self Care | Payer: 59 | Attending: Emergency Medicine | Admitting: Emergency Medicine

## 2015-07-30 ENCOUNTER — Encounter (HOSPITAL_BASED_OUTPATIENT_CLINIC_OR_DEPARTMENT_OTHER): Payer: Self-pay | Admitting: *Deleted

## 2015-07-30 DIAGNOSIS — R011 Cardiac murmur, unspecified: Secondary | ICD-10-CM | POA: Insufficient documentation

## 2015-07-30 DIAGNOSIS — X58XXXA Exposure to other specified factors, initial encounter: Secondary | ICD-10-CM | POA: Diagnosis not present

## 2015-07-30 DIAGNOSIS — G20 Parkinson's disease: Secondary | ICD-10-CM | POA: Insufficient documentation

## 2015-07-30 DIAGNOSIS — I10 Essential (primary) hypertension: Secondary | ICD-10-CM | POA: Diagnosis not present

## 2015-07-30 DIAGNOSIS — Y9389 Activity, other specified: Secondary | ICD-10-CM | POA: Insufficient documentation

## 2015-07-30 DIAGNOSIS — Z79899 Other long term (current) drug therapy: Secondary | ICD-10-CM | POA: Diagnosis not present

## 2015-07-30 DIAGNOSIS — S86812A Strain of other muscle(s) and tendon(s) at lower leg level, left leg, initial encounter: Secondary | ICD-10-CM

## 2015-07-30 DIAGNOSIS — Y998 Other external cause status: Secondary | ICD-10-CM | POA: Insufficient documentation

## 2015-07-30 DIAGNOSIS — S8992XA Unspecified injury of left lower leg, initial encounter: Secondary | ICD-10-CM | POA: Diagnosis present

## 2015-07-30 DIAGNOSIS — S86912A Strain of unspecified muscle(s) and tendon(s) at lower leg level, left leg, initial encounter: Secondary | ICD-10-CM | POA: Insufficient documentation

## 2015-07-30 DIAGNOSIS — Y9289 Other specified places as the place of occurrence of the external cause: Secondary | ICD-10-CM | POA: Insufficient documentation

## 2015-07-30 DIAGNOSIS — K5900 Constipation, unspecified: Secondary | ICD-10-CM | POA: Diagnosis not present
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
## 2015-07-30 NOTE — Discharge Instructions (Signed)

## 2015-07-30 NOTE — ED Provider Notes (Signed)
CSN: [id number]     Arrival date & time 07/30/15  1030 History   First MD Initiated Contact with Patient 07/30/15 1202     No chief complaint on file.    (Consider location/radiation/quality/duration/timing/severity/associated sxs/prior Treatment) HPI Comments: Pt comes in with c/o left lower leg pain that has been going on for a long time but worsened in that last 2 days. He denies swelling or warmth. He states that he went to run and the area got agitated. Denies numbness or weakness. No recent long traveling.   The history is provided by the patient. No language interpreter was used.    Past Medical History  Diagnosis Date  . Murmur   . Hypertension   . Bell's palsy   . Constipation   . Weakness generalized   . Parkinsons [facility])    Past Surgical History  Procedure Laterality Date  . No past surgeries     Family History  Problem Relation Age of Onset  . Hypertension Mother   . Liver disease Mother   . Hypercholesterolemia Mother   . Stroke Father   . Hypertension Father   . Diabetes Father    Social History  Substance Use Topics  . Smoking status: Never Smoker   . Smokeless tobacco: None  . Alcohol Use: No    Review of Systems  All other systems reviewed and are negative.     Allergies  Review of patient's allergies indicates no known allergies.  Home Medications   Prior to Admission medications   Medication Sig Start Date End Date Taking? Authorizing Provider  carbidopa-levodopa (SINEMET IR) 25-100 MG per tablet One tab four times a day 04/02/15  Yes Levert Feinstein, MD  clonazePAM (KLONOPIN) 0.5 MG tablet 1 to 2 tabs po qhs 02/25/15  Yes Levert Feinstein, MD  docusate sodium (COLACE) 100 MG capsule  01/04/15  Yes Historical Provider, MD  ibuprofen (ADVIL,MOTRIN) 200 MG tablet Take 200 mg by mouth every 6 (six) hours as needed.   Yes Historical Provider, MD  polyethylene glycol powder (GLYCOLAX/MIRALAX) powder  01/21/15  Yes Historical Provider, MD  rOPINIRole (REQUIP XL)  2 MG 24 hr tablet One po qhs xone week, then 2 tabs po qhs x one week, then 3 tabs po qhs. 06/17/15  Yes Levert Feinstein, MD  senna-docusate (SENOKOT-S) 8.6-50 MG tablet Start 1 tab QHS, up to 2tabs BID. Max 4 tabs daily for constipation 06/03/15  Yes Nicole Pisciotta, PA-C  valsartan-hydrochlorothiazide (DIOVAN-HCT) 160-12.5 MG per tablet  01/30/15  Yes Historical Provider, MD   BP 128/83 mmHg  Pulse 71  Temp(Src) 97.8 F (36.6 C) (Oral)  Resp 18  Ht  (1.727 m)  Wt 97.523 kg  BMI 32.70 kg/m2  SpO2 100% Physical Exam  Constitutional: He is oriented to person, place, and time. He appears well-developed and well-nourished.  Cardiovascular: Normal rate and regular rhythm.   Pulmonary/Chest: Effort normal and breath sounds normal.  Musculoskeletal:  No swelling redness or warmth to the left lower calf. Tender to palpation  Neurological: He is alert and oriented to person, place, and time.  Skin: Skin is warm and dry.  Psychiatric: He has a normal mood and affect.  Nursing note and vitals reviewed.   ED Course  Procedures (including critical care time) Labs Review Labs Reviewed - No data to display  Imaging Review No results found. I have personally reviewed and evaluated these images and lab results as part of my medical decision-making.   EKG Interpretation None  MDM   Final diagnoses:  Strain of calf muscle, left, initial encounter    Considered dvt although doubt based on history and exam. Think more likely strain. Pt given crutches for comfort. Pt has appointment with Dr. Pearletha ForgeHudnall in January    Teressa LowerVrinda Jermery Caratachea, NP 07/30/15 1234  Benjiman CoreNathan Edouard Gikas, MD 07/30/15 1534

## 2015-08-05 ENCOUNTER — Ambulatory Visit: Payer: 59 | Admitting: Family Medicine

## 2015-08-11 ENCOUNTER — Other Ambulatory Visit: Payer: Self-pay | Admitting: Neurology

## 2015-08-20 ENCOUNTER — Telehealth: Payer: Self-pay | Admitting: Neurology

## 2015-08-20 ENCOUNTER — Other Ambulatory Visit: Payer: Self-pay | Admitting: Neurology

## 2015-08-20 MED ORDER — CARBIDOPA-LEVODOPA 25-100 MG PO TABS: 2.0000 | ORAL_TABLET | Freq: Three times a day (TID) | ORAL | Status: DC

## 2015-08-20 NOTE — Telephone Encounter (Signed)
Spouse called to request refill of carbidopa-levodopa (SINEMET IR) 25-100 MG per tablet, has about 6 left and takes 6 per day and valsartan-hydrochlorothiazide (DIOVAN-HCT) 160-12.5 MG per tablet, please send to [facility] on [address].

## 2015-08-20 NOTE — Telephone Encounter (Signed)
Last OV note says: UPDATE Jun 17 2015: [facility] Dr. Rubin PayorSiddiqui in September 2016, concurred with the diagnosis of early-onset idiopathic Parkinson's disease, he is now taking Sinemet 25/100mg  2 tablets 3 times a day, he was started on Azilect at the end of September, only took few doses, could not tolerated due to lightheadedness, dizziness I called back and spoke with Ms Revonda Standardllison.  She confirmed they get Diovan from PCP.  Patient has rescheduled missed appt.

## 2015-08-28 ENCOUNTER — Telehealth: Payer: Self-pay | Admitting: Neurology

## 2015-08-28 NOTE — Telephone Encounter (Signed)
Patient called to advise his handicapped placard expires end of this month, states Dr. Terrace ArabiaYan said she would write a new one when he was here at last visit.

## 2015-08-28 NOTE — Telephone Encounter (Signed)
Last seen 06/20/15

## 2015-08-29 NOTE — Telephone Encounter (Signed)
Form signed and taken to Main[place]Dana at front desk

## 2015-08-29 NOTE — Telephone Encounter (Signed)
Form filled out and sitting on Michelle's desk waiting for signature.

## 2015-09-05 ENCOUNTER — Other Ambulatory Visit: Payer: Self-pay | Admitting: Neurology

## 2015-09-06 ENCOUNTER — Other Ambulatory Visit: Payer: Self-pay | Admitting: Neurology

## 2015-09-06 ENCOUNTER — Other Ambulatory Visit: Payer: Self-pay

## 2015-09-10 ENCOUNTER — Other Ambulatory Visit: Payer: Self-pay

## 2015-09-10 ENCOUNTER — Telehealth: Payer: Self-pay | Admitting: Neurology

## 2015-09-10 ENCOUNTER — Telehealth: Payer: Self-pay | Admitting: *Deleted

## 2015-09-10 MED ORDER — CLONAZEPAM 0.5 MG PO TABS: ORAL_TABLET | ORAL | Status: DC

## 2015-09-10 NOTE — Telephone Encounter (Signed)
Duplicate request.  Provider already approved this earlier today.

## 2015-09-10 NOTE — Telephone Encounter (Signed)
Patient needs a refill on clonazePAM.5 MG. The best number to contact is [phone]

## 2015-09-10 NOTE — Telephone Encounter (Signed)
Rx for clonazepam faxed and confirmed to pharmacy at [phone].

## 2015-09-11 ENCOUNTER — Encounter: Payer: Self-pay | Admitting: Neurology

## 2015-09-11 ENCOUNTER — Encounter (INDEPENDENT_AMBULATORY_CARE_PROVIDER_SITE_OTHER): Payer: Self-pay

## 2015-09-11 ENCOUNTER — Ambulatory Visit (INDEPENDENT_AMBULATORY_CARE_PROVIDER_SITE_OTHER): Payer: 59 | Admitting: Neurology

## 2015-09-11 VITALS — BP 129/90 | HR 68 | Ht 68.0 in | Wt 217.0 lb

## 2015-09-11 DIAGNOSIS — G20 Parkinson's disease: Secondary | ICD-10-CM

## 2015-09-11 MED ORDER — RASAGILINE MESYLATE 0.5 MG PO TABS: 0.5000 mg | ORAL_TABLET | Freq: Every day | ORAL | Status: DC

## 2015-09-11 MED ORDER — CARBIDOPA-LEVODOPA 25-100 MG PO TABS: 2.0000 | ORAL_TABLET | Freq: Three times a day (TID) | ORAL | Status: DC

## 2015-09-11 MED ORDER — CLONAZEPAM 0.5 MG PO TABS: ORAL_TABLET | ORAL | Status: DC

## 2015-09-11 NOTE — Progress Notes (Signed)
Chief Complaint  Patient presents with  . Parkinsons    He is here with his wife, Ruben Silva.  He is only taking Sinemet now.  He was unable to tolerate Requip.  It caused him intolerable dizziness.       PATIENT: Ruben Silva DOB: Jun 05, 1971  Chief Complaint  Patient presents with  . Parkinsons    He is here with his wife, Ruben Silva.  He is only taking Sinemet now.  He was unable to tolerate Requip.  It caused him intolerable dizziness.    HISTORICAL  Ruben Silva 45 yo RH male, is accompanied by his wife, and his son at today's clinical visit, he is referred by his primary care physician Dr. 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, he has mild difficulty writing, complains of dizziness, especially with sudden positional change  UPDATE June 27th 2016: There was no family history of similar disease,  I have reviewed MRI brain with patient and his family, that was normal,  Laboratory showed normal or Negative TSH, ESR, C-reactive protein RPR HIV, B12,CMP CBC, protein electrophoresis  Complains of slow worsening symptoms, increased gait difficulty, dizziness when getting up quickly, constipation, difficulty sleeping at nighttime, excessive daytime sleepiness fatigue  UPDATE August 2nd 2016: He started sinemet 25/100 one tid at 630am, 12, 6pm, he can walk better, no significant side effect. He complains of fatigue, lack of energy, he recently noticed right hand shaking   He has chronic constipation, taking magnesium citrate, miralax, he has orthostatic dizziness, he take clonazepam 0.71m prn for insomnia,  which was helpful initially, wife reported patient acting out of dreams  He denied loss of sense of smell, he denies memory loss  We have reviewed MRI of cervical spine, mild degenerative disc disease, no significant canal or foraminal stenosis CAT scan of the chest was normal  Extensive laboratory evaluations, negative or normal, paraneoplastic panel, ceruloplasmin, copper level, vitamin B1, B12, vitamin D, [place] 1295 RPR, HIV, folic acid, C-reactive protein, CPK, ESR, protein electrophoresis, Lyme titer, CBC, CMP  UPDATE Jun 17 2015: [facility] September 2016, concurred with the diagnosis of early-onset idiopathic Parkinson's disease, he is now taking Sinemet 25/1010m2 tablets 3 times a day, he was started on Azilect at the end of September, only took few doses, could not tolerated due to lightheadedness, dizziness,  He had one passing out episode June 03 2015, he has been complaining dizziness all day long after taking Azilect in the morning, getting worse after dinner around 8 PM, when he tried to stand up from seated position, he complains of worsening dizziness, landed on the floor, may be transient loss of consciousness, he was taken to the emergency room,  I personally reviewed CAT brain scan without contrast, no acute abnormality, laboratory showed normal CBC, BMP with exception of mild elevated glucose 113  He now complains of constant left shoulder pain  UPDATE Sep 11 2015: [facility], He is now taking sinemet 25/100 2 tabs tid, he could not tolerate  requip xr 43m, complains of significant GI side effect even with low dose, also complains of dizziness. He noticed wearing off at the end dose of Sinemet, he is taking at 6, 12, 6 PM now,  REVIEW OF SYSTEMS: Full 14 system review of systems performed and notable only for chill, facial swelling, double vision, blurred vision, cough,  constipation, frequency of urination, joint pain, back pain, aching muscles, neck pain, tremor, swollen lymph node, facial drooping  ALLERGIES: No Known Allergies  HOME MEDICATIONS: Current Outpatient Prescriptions  Medication Sig Dispense Refill  . docusate sodium (COLACE) 100 MG capsule   3  . ibuprofen (ADVIL,MOTRIN) 200 MG tablet Take 200 mg by mouth every 6 (six) hours as needed.    . polyethylene glycol powder (GLYCOLAX/MIRALAX) powder     . valsartan-hydrochlorothiazide (DIOVAN-HCT) 160-12.5 MG per tablet        PAST MEDICAL HISTORY: Past Medical History  Diagnosis Date  . Murmur   . Hypertension   . Bell's palsy   . Constipation   . Weakness generalized     PAST SURGICAL HISTORY: Past Surgical History  Procedure Laterality Date  . No past surgeries      FAMILY HISTORY: Family History  Problem Relation Age of Onset  . Hypertension Mother   . Liver disease Mother   . Hypercholesterolemia Mother   . Stroke Father   . Hypertension Father   . Diabetes Father     SOCIAL HISTORY:  History   Social History  . Marital Status: Married    Spouse Name: N/A  . Number of Children: 1  . Years of Education: College   Occupational History  . PGovernment social research officer   Social History Main Topics  . Smoking status: Never Smoker   . Smokeless tobacco: Not on file  . Alcohol Use: No  . Drug Use: No  . Sexual Activity: Not on file   Other Topics Concern  . Not on file   Social History Narrative   Lives at home with wife and son.   Right-hand.   Occasional use of caffeine.     PHYSICAL EXAM   Filed Vitals:   09/11/15 1603  BP: 129/90  Pulse: 68  Height: _0  (1.727 m)  Weight: 217 lb (98.431 kg)    Not recorded      Body mass index is 33 kg/(m^2).  PHYSICAL EXAMNIATION:  Gen: NAD, conversant, well nourised, obese, well groomed                     Cardiovascular: Regular rate rhythm, no peripheral edema, warm, nontender. Eyes: Conjunctivae clear  without exudates or hemorrhage Neck: Supple, no carotid bruise. Pulmonary: Clear to auscultation bilaterally   NEUROLOGICAL EXAM:  MENTAL STATUS: decreased facial expression, mild masked face, Speech:    Speech is normal; fluent and spontaneous with normal comprehension.  Cognition:    The patient is oriented to person, place, and time;     recent and remote memory intact;     language fluent;     normal attention, concentration,     fund of knowledge.  CRANIAL NERVES: CN II: Visual fields are full to confrontation. Fundoscopic exam is normal with sharp discs and no vascular changes. Pupil equal round reactive to light CN III, IV, VI: extraocular movement are normal. No ptosis. CN V: Facial sensation is intact to pinprick in all 3 divisions bilaterally. Corneal responses are intact.  CN VII: Face is symmetric with normal eye closure and  smile. CN VIII: Hearing is normal to rubbing fingers CN IX, X: Palate elevates symmetrically. Phonation is normal. CN XI: Head turning and shoulder shrug are intact CN XII: Tongue is midline with normal movements and no atrophy.  MOTOR: There is no pronator drift of out-stretched arms. Muscle bulk and strength were normal, he has mild to moderate limb and nuchal rigidity, fairly symmetric  REFLEXES: Reflexes are 2+ and symmetric at the biceps, triceps, knees, and ankles. Plantar responses are flexor.  SENSORY: Light touch, pinprick, position sense, and vibration sense are intact in fingers and toes.  COORDINATION: Rapid alternating movements and fine finger movements are intact. There is no dysmetria on finger-to-nose and heel-knee-shin.   GAIT/STANCE: He has mild stooped forward posturing, decreased bilateral arm swing, left worse than right, mild retropulsed instability   DIAGNOSTIC DATA (LABS, IMAGING, TESTING) - I reviewed patient records, labs, notes, testing and imaging myself where available.  ASSESSMENT AND PLAN  Ruben Silva is a 45 y.o. male    Early onset idiopathic Parkinson's disease  Continue Sinemet 25/100 mg 2 tablets twice a day at 6, 11, 16  Restarted low dose Azilect, 0.53m qday  He could not tolerate Requip XL 2 mg, significant GI side effect  Return to clinic in 3 month Orthostatic dizziness  Encouraging moderate exercise, increase water intake  YMarcial Pacas M.D. Ph.D.  [facility] [address] [place] [address]Ph: [phone]: [phone]

## 2015-12-07 ENCOUNTER — Other Ambulatory Visit: Payer: Self-pay | Admitting: Neurology

## 2015-12-16 ENCOUNTER — Ambulatory Visit (INDEPENDENT_AMBULATORY_CARE_PROVIDER_SITE_OTHER): Payer: 59 | Admitting: Neurology

## 2015-12-16 ENCOUNTER — Encounter: Payer: Self-pay | Admitting: Neurology

## 2015-12-16 VITALS — BP 122/78 | HR 71 | Ht 68.0 in | Wt 216.0 lb

## 2015-12-16 DIAGNOSIS — M25552 Pain in left hip: Secondary | ICD-10-CM

## 2015-12-16 DIAGNOSIS — G4700 Insomnia, unspecified: Secondary | ICD-10-CM

## 2015-12-16 DIAGNOSIS — K5900 Constipation, unspecified: Secondary | ICD-10-CM | POA: Diagnosis not present

## 2015-12-16 DIAGNOSIS — G20 Parkinson's disease: Secondary | ICD-10-CM | POA: Diagnosis not present

## 2015-12-16 DIAGNOSIS — K5909 Other constipation: Secondary | ICD-10-CM

## 2015-12-16 DIAGNOSIS — F5104 Psychophysiologic insomnia: Secondary | ICD-10-CM

## 2015-12-16 MED ORDER — DICLOFENAC SODIUM 1 % TD GEL: TRANSDERMAL | Status: DC

## 2015-12-16 MED ORDER — MELOXICAM 7.5 MG PO TABS: 7.5000 mg | ORAL_TABLET | Freq: Two times a day (BID) | ORAL | Status: DC

## 2015-12-16 MED ORDER — SILDENAFIL CITRATE 25 MG PO TABS: 25.0000 mg | ORAL_TABLET | Freq: Every day | ORAL | Status: DC | PRN

## 2015-12-16 MED ORDER — ROPINIROLE HCL 0.5 MG PO TABS: 0.5000 mg | ORAL_TABLET | Freq: Three times a day (TID) | ORAL | Status: DC

## 2015-12-16 MED ORDER — CARBIDOPA-LEVODOPA 25-100 MG PO TABS: 2.0000 | ORAL_TABLET | Freq: Four times a day (QID) | ORAL | Status: DC

## 2015-12-16 MED ORDER — CLONAZEPAM 0.5 MG PO TABS: ORAL_TABLET | ORAL | Status: DC

## 2015-12-16 NOTE — Progress Notes (Signed)
Chief Complaint  Patient presents with  . Parkinson's Disease    He is here with his wife, Vivien Rota. Reports he has been more fatigued recently and his gait has been mildly slower.  He has noticed swelling in his bilateral legs.       PATIENT: Ruben Silva DOB: 11-28-1970  Chief Complaint  Patient presents with  . Parkinson's Disease    He is here with his wife, Vivien Rota. Reports he has been more fatigued recently and his gait has been mildly slower.  He has noticed swelling in his bilateral legs.    HISTORICAL  CALLAHAN WILD 45 yo RH male, is accompanied by his wife, and his son at today's clinical visit, he is referred by his primary care physician Dr. 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, he has mild difficulty writing, complains of dizziness, especially with sudden positional change  UPDATE June 27th 2016: There was no family history of similar disease,  I have reviewed MRI brain with patient and his family, that was normal,  Laboratory showed normal or Negative TSH, ESR, C-reactive protein RPR HIV, B12,CMP CBC, protein electrophoresis  Complains of slow worsening symptoms, increased gait difficulty, dizziness when getting up quickly, constipation, difficulty sleeping at nighttime, excessive daytime sleepiness fatigue  UPDATE August 2nd 2016: He started sinemet 25/100 one tid at 630am, 12, 6pm, he can walk better, no significant side effect. He complains of fatigue, lack of energy, he recently noticed right hand shaking   He has chronic constipation, taking magnesium citrate, miralax, he has  orthostatic dizziness, he take clonazepam 0.'5mg'$  prn for insomnia, which was helpful initially, wife reported patient acting out of dreams  He denied loss of sense of smell, he denies memory loss  We have reviewed MRI of cervical spine, mild degenerative disc disease, no significant canal or foraminal stenosis CAT scan of the chest was normal  Extensive laboratory evaluations, negative or normal, paraneoplastic panel, ceruloplasmin, copper level, vitamin B1, B12, vitamin D, CA 962, RPR, HIV, folic acid, C-reactive protein, CPK, ESR, protein electrophoresis, Lyme titer, CBC, CMP  UPDATE Jun 17 2015: He was [facility] by [facility] movement specialist Dr. Linus Mako in September 2016, concurred with the diagnosis of early-onset idiopathic Parkinson's disease, he is now taking Sinemet 25/'100mg'$  2 tablets 3 times a day, he was started on Azilect at the end of September, only took few doses, could not tolerated due to lightheadedness, dizziness,  He had one passing out episode June 03 2015, he has been complaining dizziness all day long after taking Azilect in the morning, getting worse after dinner around 8 PM, when he tried to stand up from seated position, he complains of worsening dizziness, landed on the floor, may be transient loss of consciousness, he was taken to the emergency room,  I personally reviewed CAT brain scan without contrast, no acute abnormality, laboratory showed normal CBC, BMP with exception of mild elevated glucose 113  He now complains of constant left shoulder pain  UPDATE Sep 11 2015: He went to [facility], was able to exercise without much difficulty, He is now taking sinemet  25/100 2 tabs tid, he could not tolerate requip xr '2mg'$ , complains of significant GI side effect even with low dose, also complains of dizziness. He noticed wearing off at the end dose of Sinemet, he is taking at 38, 12, 6 PM now  UPDATE April 17th 2017: He is now taking sinemet 25/100 3-4 times a day, he noticed  wearing of phenomenon, denied peak dose dyskinesia, he also complains of worsening constipation, impotence, orthostatic lightheadedness, he denies memory trouble, still works full-time as a Government social research officer, denied difficulty handling his job,  Sometimes his bradykinesia is with point of affecting his driving ability.  REVIEW OF SYSTEMS: Full 14 system review of systems performed and notable only for as above  ALLERGIES: No Known Allergies  HOME MEDICATIONS: Current Outpatient Prescriptions  Medication Sig Dispense Refill  . docusate sodium (COLACE) 100 MG capsule   3  . ibuprofen (ADVIL,MOTRIN) 200 MG tablet Take 200 mg by mouth every 6 (six) hours as needed.    . polyethylene glycol powder (GLYCOLAX/MIRALAX) powder     . valsartan-hydrochlorothiazide (DIOVAN-HCT) 160-12.5 MG per tablet        PAST MEDICAL HISTORY: Past Medical History  Diagnosis Date  . Murmur   . Hypertension   . Bell's palsy   . Constipation   . Weakness generalized     PAST SURGICAL HISTORY: Past Surgical History  Procedure Laterality Date  . No past surgeries      FAMILY HISTORY: Family History  Problem Relation Age of Onset  . Hypertension Mother   . Liver disease Mother   . Hypercholesterolemia Mother   . Stroke Father   . Hypertension Father   . Diabetes Father     SOCIAL HISTORY:  History   Social History  . Marital Status: Married    Spouse Name: N/A  . Number of Children: 1  . Years of Education: College   Occupational History  . Government social research officer    Social History Main Topics  . Smoking status: Never Smoker   . Smokeless tobacco: Not on file  . Alcohol Use: No  . Drug Use: No  . Sexual Activity: Not on file   Other Topics Concern  . Not on file   Social History Narrative   Lives at home with wife and son.   Right-hand.   Occasional use of caffeine.     PHYSICAL EXAM   Filed Vitals:   12/16/15 1714  BP: 122/78  Pulse: 71  Height: '5\' 8"'$  (1.727 m)  Weight: 216  lb (97.977 kg)    Not recorded      Body mass index is 32.85 kg/(m^2).  PHYSICAL EXAMNIATION: This exam is performed 5 hours after last dose of Sinemet 25/100 mg 2 tablets.  Gen: NAD, conversant, well nourised, obese, well groomed                     Cardiovascular: Regular rate rhythm, no peripheral edema, warm, nontender. Eyes: Conjunctivae clear without exudates or hemorrhage Neck: Supple, no carotid bruise. Pulmonary: Clear to auscultation bilaterally   NEUROLOGICAL EXAM:  MENTAL STATUS: decreased facial expression, mild masked face, Speech:    Speech is normal; fluent and spontaneous with normal comprehension.  Cognition:    The patient is oriented to person, place, and time;     recent and remote memory intact;     language fluent;     normal attention, concentration,     fund of knowledge.  CRANIAL NERVES: CN II: Visual  fields are full to confrontation. Fundoscopic exam is normal with sharp discs and no vascular changes. Pupil equal round reactive to light CN III, IV, VI: extraocular movement are normal. No ptosis. CN V: Facial sensation is intact to pinprick in all 3 divisions bilaterally. Corneal responses are intact.  CN VII: Face is symmetric with normal eye closure and smile. CN VIII: Hearing is normal to rubbing fingers CN IX, X: Palate elevates symmetrically. Phonation is normal. CN XI: Head turning and shoulder shrug are intact CN XII: Tongue is midline with normal movements and no atrophy.  MOTOR: He has moderate bilateral upper and lower extremity, nuchal rigidity, right slightly worse than the left, bradykinesia.  REFLEXES: Reflexes are 2+ and symmetric at the biceps, triceps, knees, and ankles. Plantar responses are flexor.  SENSORY: Light touch, pinprick, position sense, and vibration sense are intact in fingers and toes.  COORDINATION: Rapid alternating movements and fine finger movements are intact. There is no dysmetria on finger-to-nose and  heel-knee-shin.   GAIT/STANCE: He has mild stooped forward posturing, decreased bilateral arm swing, left worse than right, [place] turning, mild retropulsed instability   DIAGNOSTIC DATA (LABS, IMAGING, TESTING) - I reviewed patient records, labs, notes, testing and imaging myself where available.  ASSESSMENT AND PLAN  Ruben Silva is a 45 y.o. male    Early onset idiopathic Parkinson's disease  Continue Sinemet 25/100 mg 2 tablets four times a day at 6, 11, 16, 20:00  Increase azilect from 0.'5mg'$  to '1mg'$  qday   He could not tolerate Requip XL 2 mg in the past due to significant GI side effect, will reintroduce to 0.'5mg'$  tid.    Orthostatic dizziness  Encouraging moderate exercise, increase water intake  Chronic Constipation:  I have suggested Metamucil daily, Colace, exercise, increase water intake  Impotence:  Started Viagra 25 mg as needed  Chronic insomnia  Responding well to clonazepam 0.5 milligrams every night as needed, refilled his prescription  Left hip pain: Following his fall few month ago  I ordered x-ray of left hip,  Marcial Pacas, M.D. Ph.D.  [facility] [address] [place],  [address] Ph: [phone] Fax: [phone]

## 2015-12-17 DIAGNOSIS — F5104 Psychophysiologic insomnia: Secondary | ICD-10-CM | POA: Insufficient documentation

## 2015-12-17 DIAGNOSIS — K5909 Other constipation: Secondary | ICD-10-CM | POA: Insufficient documentation

## 2016-02-26 ENCOUNTER — Telehealth: Payer: Self-pay | Admitting: Neurology

## 2016-02-26 NOTE — Telephone Encounter (Signed)
Patient called, "feels like he's hit a brick wall with his Parkinson's, went to bed at 7:00pm last night because he was so tired. Not sure if I need to go up in medication or what". States he took his medication an hour late yesterday, needs to get back on track. Please call [phone] or [phone].

## 2016-02-26 NOTE — Telephone Encounter (Signed)
Left messages at both numbers requesting a return.

## 2016-02-26 NOTE — Telephone Encounter (Signed)
Spoke to patient - he would like to be seen early.  Feels his medication is wearing off too early and he has had increased fatigue.

## 2016-02-27 ENCOUNTER — Ambulatory Visit (INDEPENDENT_AMBULATORY_CARE_PROVIDER_SITE_OTHER): Payer: 59 | Admitting: Neurology

## 2016-02-27 ENCOUNTER — Encounter: Payer: Self-pay | Admitting: Neurology

## 2016-02-27 VITALS — BP 121/83 | HR 64 | Ht 68.0 in | Wt 214.0 lb

## 2016-02-27 DIAGNOSIS — K5900 Constipation, unspecified: Secondary | ICD-10-CM | POA: Diagnosis not present

## 2016-02-27 DIAGNOSIS — M25552 Pain in left hip: Secondary | ICD-10-CM | POA: Diagnosis not present

## 2016-02-27 DIAGNOSIS — K5909 Other constipation: Secondary | ICD-10-CM

## 2016-02-27 DIAGNOSIS — G20 Parkinson's disease: Secondary | ICD-10-CM

## 2016-02-27 MED ORDER — CARBIDOPA-LEVODOPA-ENTACAPONE 50-200-200 MG PO TABS: 1.0000 | ORAL_TABLET | Freq: Four times a day (QID) | ORAL | Status: DC

## 2016-02-27 MED ORDER — ROPINIROLE HCL 0.5 MG PO TABS: 1.0000 mg | ORAL_TABLET | Freq: Three times a day (TID) | ORAL | Status: DC

## 2016-02-27 MED ORDER — ROPINIROLE HCL ER 2 MG PO TB24: 4.0000 mg | ORAL_TABLET | Freq: Every day | ORAL | Status: DC

## 2016-02-27 MED ORDER — RASAGILINE MESYLATE 1 MG PO TABS: 1.0000 mg | ORAL_TABLET | Freq: Every day | ORAL | Status: DC

## 2016-02-27 NOTE — Progress Notes (Signed)
Chief Complaint  Patient presents with  . Parkinson's Disease    He is here with wife, Ruben Silva. Reports having worsening fatigue, weakness in his left arm/hand, joint pain/stiffness, and a slower gait. Says he is experiencing a wearing off of his medication before his next dose is due, especially over the last two days.  He has had a poor appetite and not eating much.       PATIENT: Ruben Silva DOB: July 08, 1971  Chief Complaint  Patient presents with  . Parkinson's Disease    He is here with wife, Ruben Silva. Reports having worsening fatigue, weakness in his left arm/hand, joint pain/stiffness, and a slower gait. Says he is experiencing a wearing off of his medication before his next dose is due, especially over the last two days.  He has had a poor appetite and not eating much.    HISTORICAL  Ruben Silva 45 yo RH male, is accompanied by his wife, and his son at today's clinical visit, he is referred by his primary care physician Dr. 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, he has mild difficulty writing, complains of dizziness, especially with sudden positional change  UPDATE June 27th 2016: There was no family history of similar disease,  I have reviewed MRI brain with patient and his family, that was normal,  Laboratory showed normal or Negative TSH, ESR, C-reactive protein RPR HIV, B12,CMP CBC, protein electrophoresis  Complains of slow worsening symptoms, increased gait difficulty, dizziness when getting up quickly, constipation, difficulty sleeping at nighttime, excessive daytime sleepiness  fatigue  UPDATE August 2nd 2016: He started sinemet 25/100 one tid at 630am, 12, 6pm, he can walk better, no significant side effect. He complains of fatigue, lack of energy, he recently noticed right hand shaking   He has chronic constipation, taking magnesium citrate, miralax, he has orthostatic dizziness, he take clonazepam 0.73m prn for insomnia, which was helpful initially, wife reported patient acting out of dreams  He denied loss of sense of smell, he denies memory loss  We have reviewed MRI of cervical spine, mild degenerative disc disease, no significant canal or foraminal stenosis CAT scan of the chest was normal  Extensive laboratory evaluations, negative or normal, paraneoplastic panel, ceruloplasmin, copper level, vitamin B1, B12, vitamin D, CA 1654 RPR, HIV, folic acid, C-reactive protein, CPK, ESR, protein electrophoresis, Lyme titer, CBC, CMP  UPDATE Jun 17 2015: [facility] 2016, concurred with the diagnosis of early-onset idiopathic Parkinson's disease, he is now taking Sinemet 25/1054m2 tablets 3 times a day, he was started on Azilect at the end of September, only took few doses, could not tolerated due to lightheadedness, dizziness,  He had one passing out episode June 03 2015, he has been complaining dizziness all day long after taking Azilect in the morning, getting worse after dinner around 8 PM, when he tried to stand up from seated position, he complains of worsening dizziness, landed on the floor, may be transient loss of consciousness, he was taken to the emergency room,  I personally reviewed CAT  brain scan without contrast, no acute abnormality, laboratory showed normal CBC, BMP with exception of mild elevated glucose 113  He now complains of constant left shoulder pain  UPDATE Sep 11 2015: He went to [employer], was able to exercise without much difficulty, He is now taking sinemet 25/100 2 tabs tid, he could  not tolerate requip xr 2mg, complains of significant GI side effect even with low dose, also complains of dizziness. He noticed wearing off at the end dose of Sinemet, he is taking at 6, 12, 6 PM now  UPDATE April 17th 2017: He is now taking sinemet 25/100 3-4 times a day, he noticed wearing of phenomenon, denied peak dose dyskinesia, he also complains of worsening constipation, impotence, orthostatic lightheadedness, he denies memory trouble, still works full-time as a project manager, denied difficulty handling his job,  Sometimes his bradykinesia is with point of affecting his driving ability.  UPDATE June 29th 2017: He recent few days, he noticed quick wearing off of his sinemet 25/100 2 tabs before the set time at 6, 11, 16, 20, it takes long time after he takes Sinemet before the benefit of the medication kick in.  His constipation has improved, he is also taking Requip xl 4mg at night, Requip 0.25mg tid  He has difficulty sleeping, really stiff clonazepam has been helpful  REVIEW OF SYSTEMS: Full 14 system review of systems performed and notable only for joint pain, walking difficulty, weakness  ALLERGIES: No Known Allergies  HOME MEDICATIONS: Current Outpatient Prescriptions  Medication Sig Dispense Refill  . docusate sodium (COLACE) 100 MG capsule   3  . ibuprofen (ADVIL,MOTRIN) 200 MG tablet Take 200 mg by mouth every 6 (six) hours as needed.    . polyethylene glycol powder (GLYCOLAX/MIRALAX) powder     . valsartan-hydrochlorothiazide (DIOVAN-HCT) 160-12.5 MG per tablet        PAST MEDICAL HISTORY: Past Medical History  Diagnosis Date  . Murmur   . Hypertension   . Bell's palsy   . Constipation   . Weakness generalized     PAST SURGICAL HISTORY: Past Surgical History  Procedure Laterality Date  . No past surgeries      FAMILY HISTORY: Family History  Problem Relation Age of Onset  . Hypertension Mother   . Liver disease Mother   . Hypercholesterolemia  Mother   . Stroke Father   . Hypertension Father   . Diabetes Father     SOCIAL HISTORY:  History   Social History  . Marital Status: Married    Spouse Name: N/A  . Number of Children: 1  . Years of Education: College   Occupational History  . Project Manager    Social History Main Topics  . Smoking status: Never Smoker   . Smokeless tobacco: Not on file  . Alcohol Use: No  . Drug Use: No  . Sexual Activity: Not on file   Other Topics Concern  . Not on file   Social History Narrative   Lives at home with wife and son.   Right-hand.   Occasional use of caffeine.     PHYSICAL EXAM   Filed Vitals:   02/27/16 1548  BP: 121/83  Pulse: 64  Height: 5' 8" (1.727 m)  Weight: 214 lb (97.07 kg)    Not recorded      Body mass index is 32.55 kg/(m^2).  PHYSICAL EXAMNIATION: This exam is performed 5 hours after last dose of Sinemet 25/100 mg 2 tablets.  Gen: NAD, conversant, well   nourised, obese, well groomed                     Cardiovascular: Regular rate rhythm, no peripheral edema, warm, nontender. Eyes: Conjunctivae clear without exudates or hemorrhage Neck: Supple, no carotid bruise. Pulmonary: Clear to auscultation bilaterally   NEUROLOGICAL EXAM:  MENTAL STATUS: decreased facial expression, mild masked face, Speech:    Speech is normal; fluent and spontaneous with normal comprehension.  Cognition:    The patient is oriented to person, place, and time;     recent and remote memory intact;     language fluent;     normal attention, concentration,     fund of knowledge.  CRANIAL NERVES: CN II: Visual fields are full to confrontation. Fundoscopic exam is normal with sharp discs and no vascular changes. Pupil equal round reactive to light CN III, IV, VI: extraocular movement are normal. No ptosis. CN V: Facial sensation is intact to pinprick in all 3 divisions bilaterally. Corneal responses are intact.  CN VII: Face is symmetric with normal eye closure  and smile. CN VIII: Hearing is normal to rubbing fingers CN IX, X: Palate elevates symmetrically. Phonation is normal. CN XI: Head turning and shoulder shrug are intact CN XII: Tongue is midline with normal movements and no atrophy.  MOTOR: He has moderate bilateral upper and lower extremity, nuchal rigidity, right slightly worse than the left, bradykinesia.  REFLEXES: Reflexes are 2+ and symmetric at the biceps, triceps, knees, and ankles. Plantar responses are flexor.  SENSORY: Light touch, pinprick, position sense, and vibration sense are intact in fingers and toes.  COORDINATION: Rapid alternating movements and fine finger movements are intact. There is no dysmetria on finger-to-nose and heel-knee-shin.   GAIT/STANCE: He has mild stooped forward posturing, decreased bilateral arm swing, left worse than right, [place] turning, mild retropulsed instability   DIAGNOSTIC DATA (LABS, IMAGING, TESTING) - I reviewed patient records, labs, notes, testing and imaging myself where available.  ASSESSMENT AND PLAN  Ruben Silva is a 45 y.o. male    Early onset idiopathic Parkinson's disease  Change Sinemet 25/100 mg 2 tablets four times a day to Stalevo 50/200/204 times a day  Keep azilect from  66m qday   Keep Requip XL 2 mg 2 tablets every night, increase Requip 0.5 milligrams to 2 tablets 3 times a day    Orthostatic dizziness  Encouraging moderate exercise, increase water intake  Chronic Constipation:  I have suggested Metamucil daily, Colace, exercise, increase water intake  Impotence:  Started Viagra 25 mg as needed  Chronic insomnia  Responding well to clonazepam 0.5 milligrams every night as needed, refilled his prescription   YMarcial Pacas M.D. Ph.D.  [facility] [address] [place] [address]Ph: [phone]: [phone]

## 2016-03-08 ENCOUNTER — Telehealth: Payer: Self-pay | Admitting: Neurology

## 2016-03-08 NOTE — Telephone Encounter (Signed)
Patient called said that he has had severe insomnia since Dr. Terrace ArabiaYan changed his sinemet medication to stalevo. They stopped he requip as it made him sick.  Last night for example he took 7 tylenol pm and 2 benadryl and still could not sleep. He did not take the clonazepam however. The stalevo helps a lot more than the sinemet.    I advised him to skp the 8pm dose of stalevo and take the cloazepam tonight, he has 0.5mg  and he can take 1-3 of the clonazepam if needed. And I will have Dr. Terrace ArabiaYan call him back tomorrow.   Thanks.

## 2016-03-09 MED ORDER — QUETIAPINE FUMARATE 25 MG PO TABS: 25.0000 mg | ORAL_TABLET | Freq: Every day | ORAL | Status: DC

## 2016-03-09 MED ORDER — CLONAZEPAM 1 MG PO TABS: 1.0000 mg | ORAL_TABLET | Freq: Every day | ORAL | Status: DC

## 2016-03-09 NOTE — Telephone Encounter (Signed)
I have called patient and his wife at [phone],  He has severe insomnia,   The change to Stalevo 50/200/200 has helped him moving,  at 6, 10, 14, 17,  Increase clonazepam from 0.5mg  to 1mg  prn for insomnia, Add melatonin 1mg  prn   His wife reported visual hallucination at evening time, I also Rx seroquel 25mg  qhs if above change did not help his sleep.  I also advise him to call/email back in one week for update.

## 2016-03-11 NOTE — Telephone Encounter (Addendum)
Spoke to patient - he is feeling much better since his medication change.  He has more energy and has been walking 4-6 miles per day. His insomnia has improved and he is sleeping at night now.

## 2016-04-06 IMAGING — CT CT RENAL STONE PROTOCOL
2 of 4 series · 17 of 46 positions shown, 19 images · non-contrast
Comparison: None.

CLINICAL DATA: Right upper quadrant pain for 2 weeks. Right flank
pain.

EXAM:
CT ABDOMEN AND PELVIS WITHOUT CONTRAST
TECHNIQUE: Multidetector CT imaging of the abdomen and pelvis was performed
following the standard protocol without IV contrast.

[Series 2: renal stone > 200 lbs 5.0 b31f · axial · 0.86mm/px · z∈[-499,-19]mm · 14 of 106 slices shown, 16 images]
[im 5/106  soft-tissue]
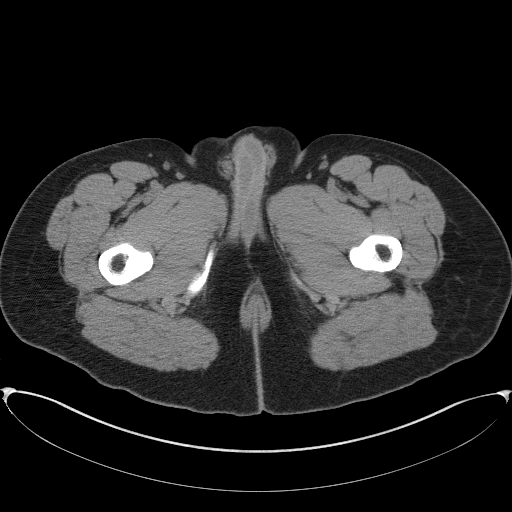
[im 5/106  bone]
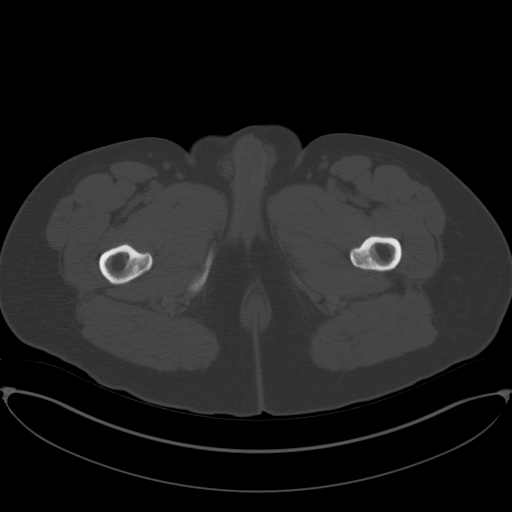
[im 15/106  soft-tissue]
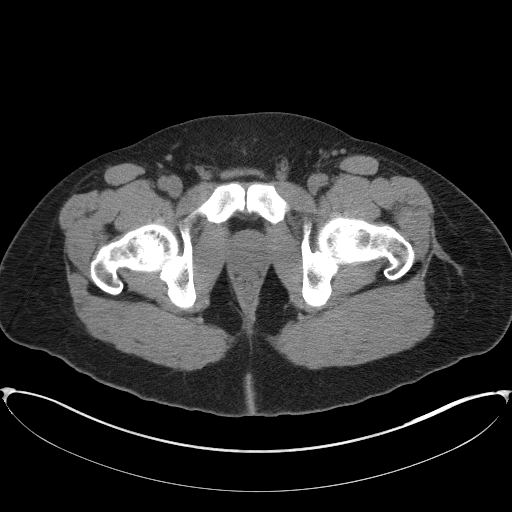
[im 20/106  soft-tissue]
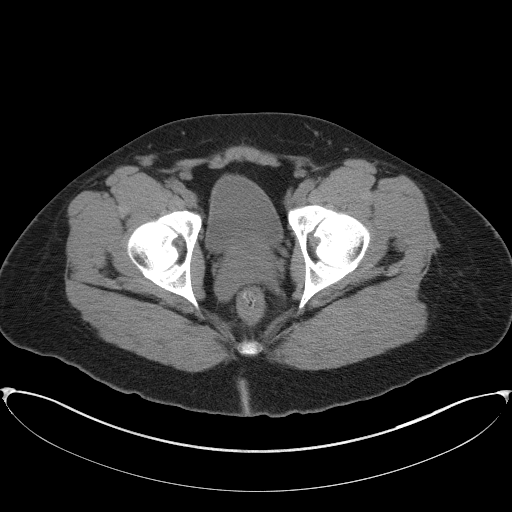
[im 29/106  soft-tissue]
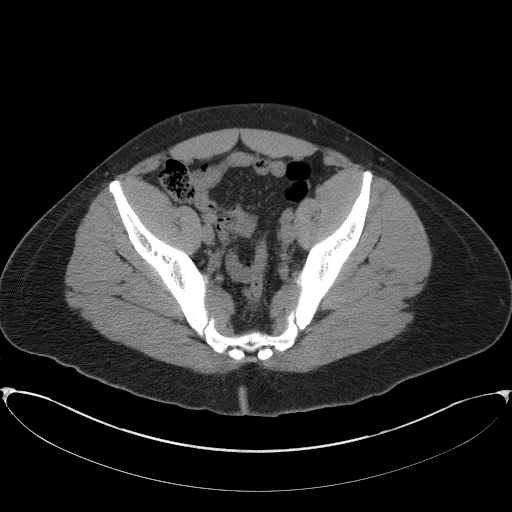
[im 34/106  soft-tissue]
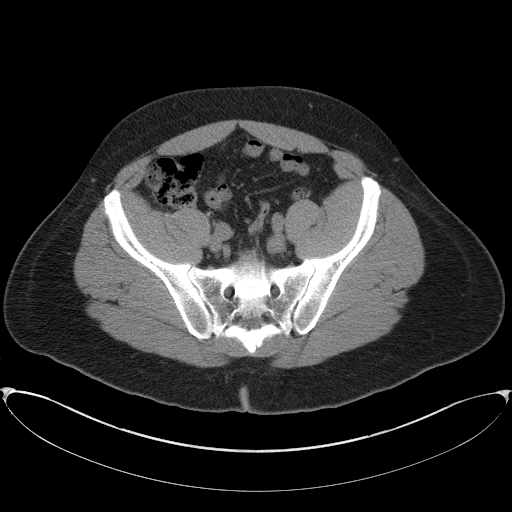
[im 43/106  soft-tissue]
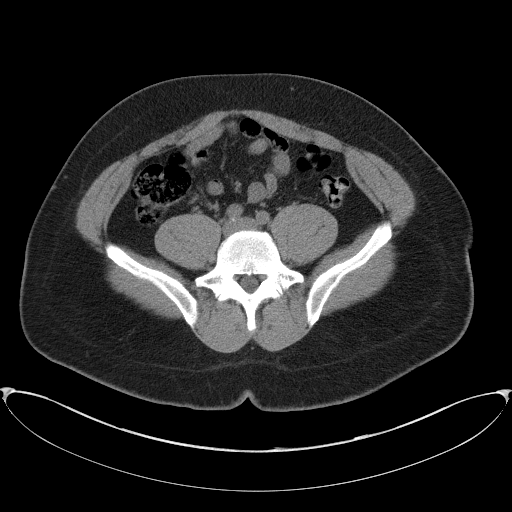
[im 48/106  soft-tissue]
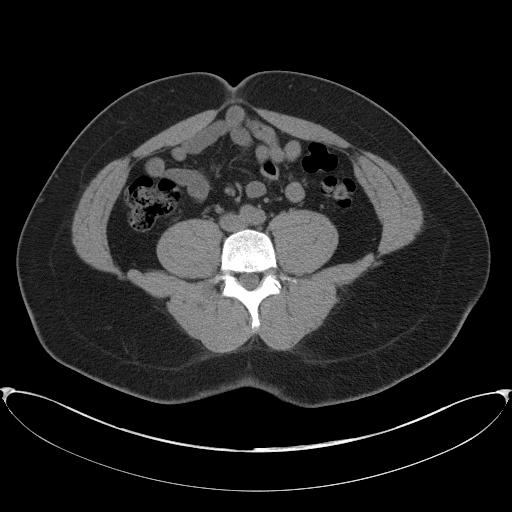
[im 58/106  soft-tissue]
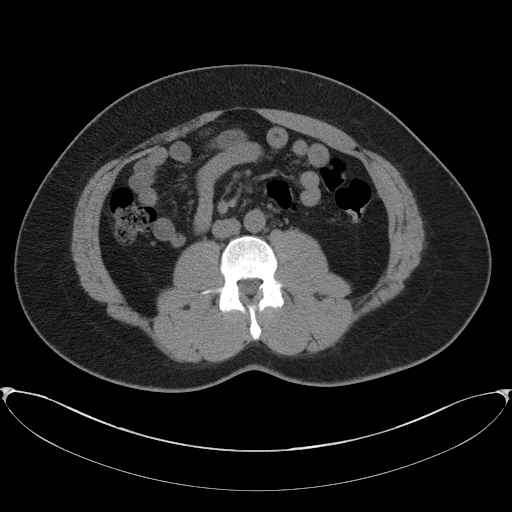
[im 63/106  soft-tissue]
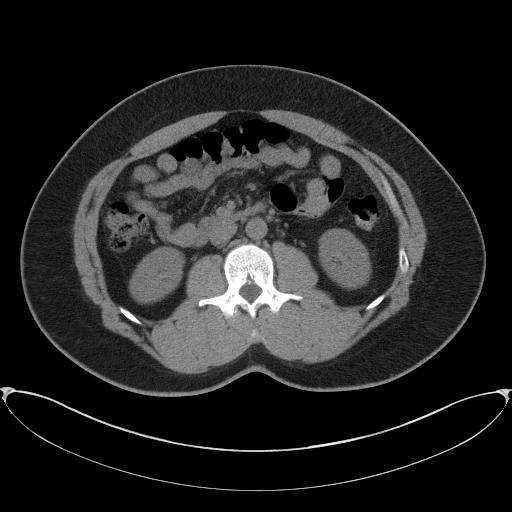
[im 63/106  bone]
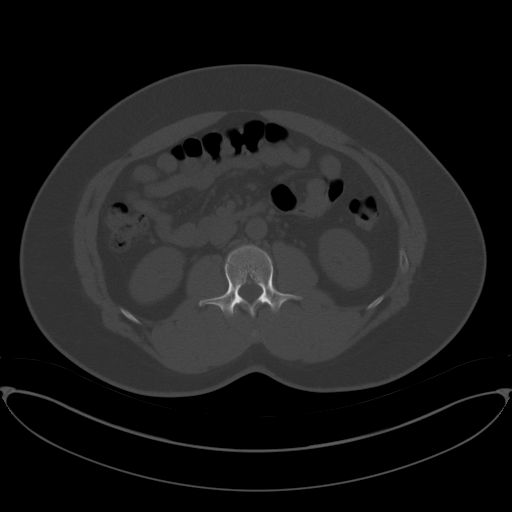
[im 72/106  soft-tissue]
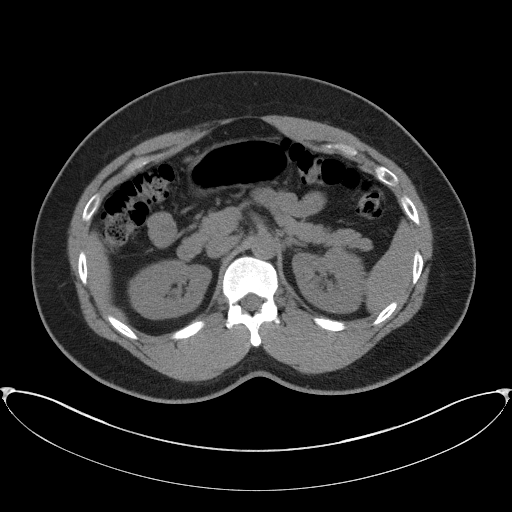
[im 77/106  soft-tissue]
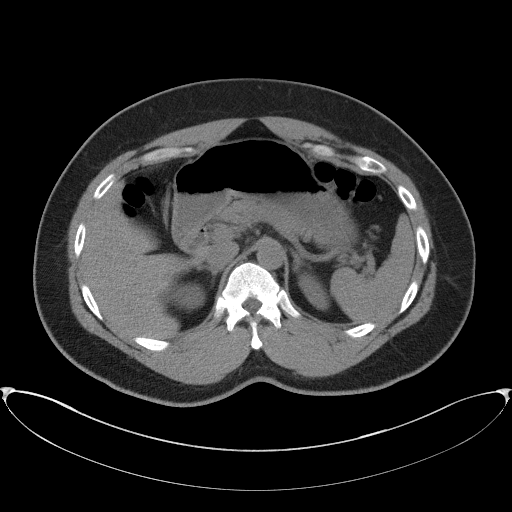
[im 86/106  soft-tissue]
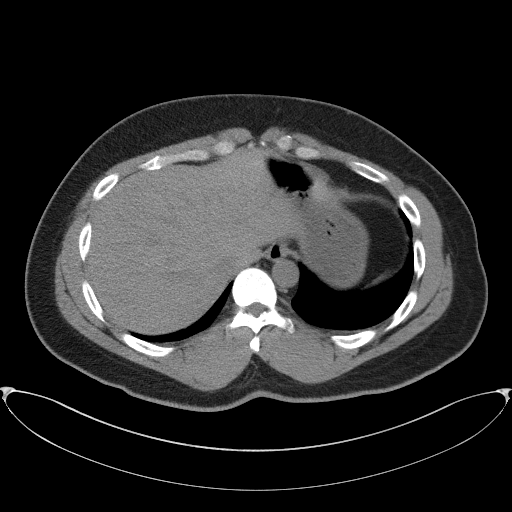
[im 91/106  soft-tissue]
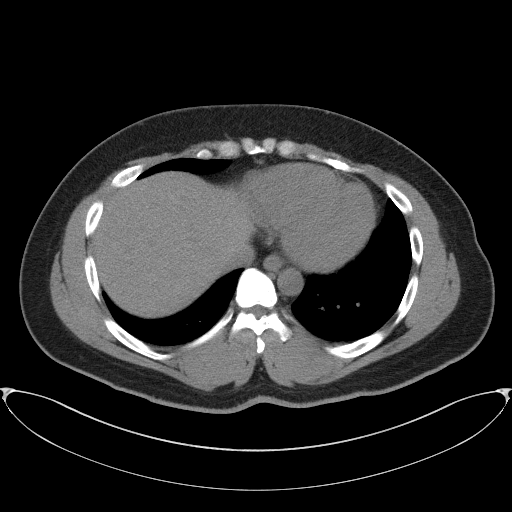
[im 101/106  soft-tissue]
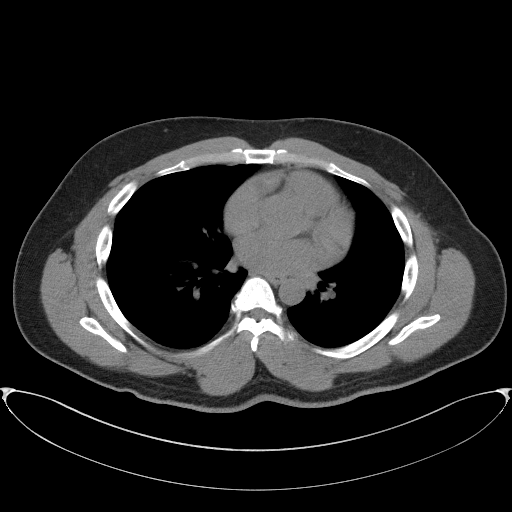

[Series 5: renal stone 3.0 coronal · coronal · 0.87mm/px · 3 of 97 slices shown]
[im 33/97  soft-tissue]
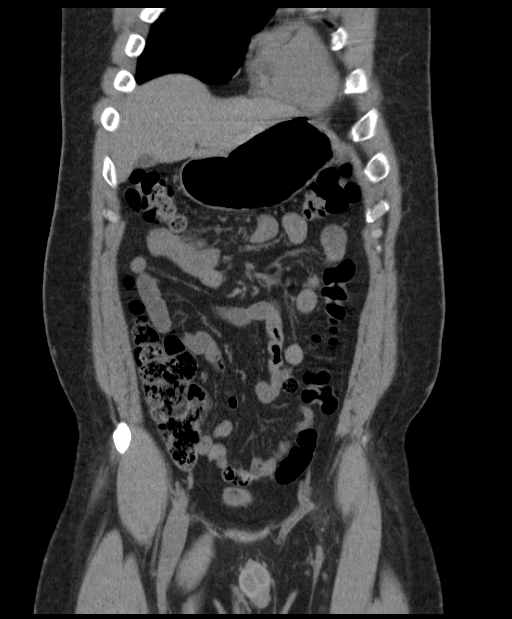
[im 43/97  soft-tissue]
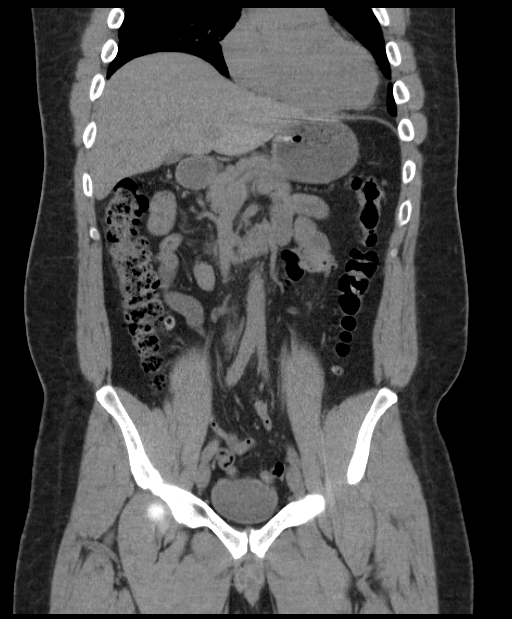
[im 54/97  soft-tissue]
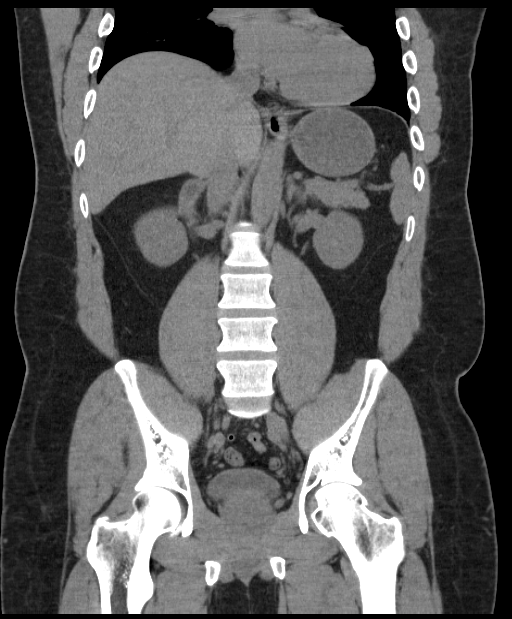

[17 of 46 positions shown; findings below may reference images not displayed]

FINDINGS: There is slight elevation of the right hemidiaphragm. Liver
parenchyma is normal. Gallbladder is contracted and appears normal.
No dilated bile ducts.

Spleen, pancreas, adrenal glands, and left kidney are normal. There
is a 16 mm low-density lesion in the posterior aspect of the mid
right kidney measuring -1 Hounsfield units, most likely a cyst.

The bowel is normal except for a single diverticulum in the
ascending colon. Appendix and terminal ileum are normal. Bladder and
prostate gland are normal.

No free air or free fluid. No adenopathy. No calcifications in the
abdominal aorta. No osseous abnormalities.
IMPRESSION: Benign-appearing abdomen. Normal appearing gallbladder. Single
diverticulum in the ascending colon.

Low-density lesion in the right kidney is most likely a cyst.

## 2016-04-15 ENCOUNTER — Ambulatory Visit: Payer: 59 | Admitting: Neurology

## 2016-04-15 ENCOUNTER — Telehealth: Payer: Self-pay | Admitting: *Deleted

## 2016-04-15 NOTE — Telephone Encounter (Signed)
No showed follow up appt.  Phone number has changed - unable to reach pt.

## 2016-04-17 ENCOUNTER — Encounter: Payer: Self-pay | Admitting: Neurology

## 2016-05-06 ENCOUNTER — Ambulatory Visit: Payer: 59 | Admitting: Neurology

## 2016-05-12 ENCOUNTER — Ambulatory Visit (INDEPENDENT_AMBULATORY_CARE_PROVIDER_SITE_OTHER): Payer: 59 | Admitting: Neurology

## 2016-05-12 ENCOUNTER — Encounter: Payer: Self-pay | Admitting: Neurology

## 2016-05-12 VITALS — BP 114/75 | HR 95 | Ht 68.0 in | Wt 203.0 lb

## 2016-05-12 DIAGNOSIS — G20 Parkinson's disease: Secondary | ICD-10-CM

## 2016-05-12 DIAGNOSIS — M545 Low back pain, unspecified: Secondary | ICD-10-CM | POA: Insufficient documentation

## 2016-05-12 DIAGNOSIS — R42 Dizziness and giddiness: Secondary | ICD-10-CM

## 2016-05-12 DIAGNOSIS — K5900 Constipation, unspecified: Secondary | ICD-10-CM

## 2016-05-12 DIAGNOSIS — G4700 Insomnia, unspecified: Secondary | ICD-10-CM | POA: Diagnosis not present

## 2016-05-12 DIAGNOSIS — F5104 Psychophysiologic insomnia: Secondary | ICD-10-CM

## 2016-05-12 DIAGNOSIS — K5909 Other constipation: Secondary | ICD-10-CM

## 2016-05-12 MED ORDER — LINACLOTIDE 72 MCG PO CAPS: 72.0000 ug | ORAL_CAPSULE | Freq: Every day | ORAL | 11 refills | Status: DC

## 2016-05-12 NOTE — Progress Notes (Signed)
Chief Complaint  Patient presents with  . Parkinson's Disease    He is walking daily and has lost weight.  He has stopped taking all his Parkinson's medications, except Stalevo  He stopped taking Azilect because it caused intolerable side effects (sickness). He is unsure why he stopped Requip. He has noticed his urine to be orange tinted lately.  His also has intermittent low back pain.       PATIENT: Ruben Silva DOB: 01-25-1971  Chief Complaint  Patient presents with  . Parkinson's Disease    He is walking daily and has lost weight.  He has stopped taking all his Parkinson's medications, except Stalevo  He stopped taking Azilect because it caused intolerable side effects (sickness). He is unsure why he stopped Requip. He has noticed his urine to be orange tinted lately.  His also has intermittent low back pain.    HISTORICAL  Ruben Silva 45 yo right-handed male, with early-onset idiopathic Parkinson's disease, his primary care physician Dr. Iona Silva, I saw him initially in June 2016  Since October 2015,he was noticed by the family to move at the slower pace, shuffling his gait,gradually getting worse over the past few months, he was also noticed to have right hand shaking, slow to response, decreased facial expression, to the point of needing help to pull up his pants sometimes, he also complains of orthostatic dizziness, difficulty sleeping at nighttime, fatigue, chronic constipation, impotence. 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, he has mild difficulty writing, complains of dizziness, especially with sudden positional change  MRI brain in June 2016 was normal, MRI of cervical spine, mild degenerative disc disease, no significant canal or foraminal stenosis CAT scan of the chest was normal  Laboratory in 2016 showed normal or Negative TSH, ESR, C-reactive protein RPR HIV, B12,CMP  CBC, protein electrophoresis, more extensive laboratory evaluations, negative or normal, paraneoplastic panel, ceruloplasmin, copper level, vitamin B1, B12, vitamin D, CA 417, RPR, HIV, folic acid, C-reactive protein, CPK, ESR, protein electrophoresis, Lyme titer, CBC, CMP  He was [facility] by [facility] movement specialist Dr. Linus Silva in September 2016, concurred with the diagnosis of early-onset idiopathic Parkinson's disease  He started sinemet since June 2016, gradually titrating dose, which did help his symptoms, he notice wearing off despite quick titrating dose of Sinemet 25/100 mg, 2 tablets 4 times a day, wearing off dyskinesia, he was started on Stalevo 200 mg since February 27 2016 at 6am, 10am, 4pm, 7pm, He goes to bed at 10pm.  But could not tolerate dopamine agonist or Azilect, Azilect-upset stomach, worsening dizziness, Requip-significant GI side effect, dizziness,  He walks regularly 2-4 miles each day.  he was taken to the emergency room in October 2016 after fall, dizziness, repeat CAT scan of the brain in October 2016 was normal  He now complains of constant left shoulder pain  UPDATE May 12 2016: Parkinson's disease: he was started on Stalevo 200 mg since February 27 2016 at 6am, 10am, 4pm, 7pm, He goes to bed at 10pm. He is very happy about current medications, denies significant bradykinesia, able to keep up with his working schedule, complains of orange color discoloration of the urine  Chronic Constipation: Once or twice each week, he took Dulcolax on the weekends,  Insomnia:  He takes clonazepam 1 mg every night works well   Orthostatic dizziness: Worsening with [employer], and Azilect,  improved after he stopped medications,  REVIEW OF SYSTEMS: Full 14 system review of systems performed and notable only for low back pain, blurred vision headaches  ALLERGIES: No Known Allergies  HOME MEDICATIONS: Current Outpatient Prescriptions  Medication Sig Dispense Refill  .  docusate sodium (COLACE) 100 MG capsule   3  . ibuprofen (ADVIL,MOTRIN) 200 MG tablet Take 200 mg by mouth every 6 (six) hours as needed.    . polyethylene glycol powder (GLYCOLAX/MIRALAX) powder     . valsartan-hydrochlorothiazide (DIOVAN-HCT) 160-12.5 MG per tablet        PAST MEDICAL HISTORY: Past Medical History  Diagnosis Date  . Murmur   . Hypertension   . Bell's palsy   . Constipation   . Weakness generalized     PAST SURGICAL HISTORY: Past Surgical History:  Procedure Laterality Date  . NO PAST SURGERIES      FAMILY HISTORY: Family History  Problem Relation Age of Onset  . Hypertension Mother   . Liver disease Mother   . Hypercholesterolemia Mother   . Stroke Father   . Hypertension Father   . Diabetes Father     SOCIAL HISTORY:  History   Social History  . Marital Status: Married    Spouse Name: N/A  . Number of Children: 1  . Years of Education: College   Occupational History  . Government social research officer    Social History Main Topics  . Smoking status: Never Smoker   . Smokeless tobacco: Not on file  . Alcohol Use: No  . Drug Use: No  . Sexual Activity: Not on file   Other Topics Concern  . Not on file   Social History Narrative   Lives at home with wife and son.   Right-hand.   Occasional use of caffeine.     PHYSICAL EXAM   Vitals:   05/12/16 1036  BP: 114/75  Pulse: 95  Weight: 203 lb (92.1 kg)  Height: '5\' 8"'$  (1.727 m)    Not recorded      Body mass index is 30.87 kg/m.  PHYSICAL EXAMNIATION: This exam is performed 5 hours after last dose of Stalevo 200  Gen: NAD, conversant, well nourised, obese, well groomed                     Cardiovascular: Regular rate rhythm, no peripheral edema, warm, nontender. Eyes: Conjunctivae clear without exudates or hemorrhage Neck: Supple, no carotid bruise. Pulmonary: Clear to auscultation bilaterally   NEUROLOGICAL EXAM:  MENTAL STATUS: decreased facial expression, mild masked  face, Speech:    Speech is normal; fluent and spontaneous with normal comprehension.  Cognition:    The patient is oriented to person, place, and time;     recent and remote memory intact;     language fluent;     normal attention, concentration,     fund of knowledge.  CRANIAL NERVES: CN II: Visual fields are full to confrontation. Fundoscopic exam is normal with sharp discs and no vascular changes. Pupil equal round reactive to light CN III, IV, VI: extraocular movement are normal. No ptosis. CN V: Facial sensation is intact to pinprick in all 3 divisions bilaterally. Corneal responses are intact.  CN VII: Face is symmetric with normal eye closure and smile. CN VIII: Hearing is normal to rubbing fingers CN IX, X: Palate elevates symmetrically. Phonation is normal. CN XI: Head turning and shoulder shrug are intact CN XII: Tongue is midline with normal movements and no atrophy.  MOTOR:  He has mild bilateral upper and lower extremity, nuchal rigidity, right slightly worse than the left, mild bradykinesia.  REFLEXES: Reflexes are 2+ and symmetric at the biceps, triceps, knees, and ankles. Plantar responses are flexor.  SENSORY: Light touch, pinprick, position sense, and vibration sense are intact in fingers and toes.  COORDINATION: Rapid alternating movements and fine finger movements are intact. There is no dysmetria on finger-to-nose and heel-knee-shin.   GAIT/STANCE: He has mildly decreased right arm swing, moderate stride, is most turning.   DIAGNOSTIC DATA (LABS, IMAGING, TESTING) - I reviewed patient records, labs, notes, testing and imaging myself where available.  ASSESSMENT AND PLAN  Ruben Silva is a 45 y.o. male    Early onset idiopathic Parkinson's disease  Keep Stalevo 50/200/200 4 times a day  Could not tolerate azilect, dopamine agonist Requip, due to GI side effect, increased orthostatic dizziness    Orthostatic dizziness  Encouraging moderate  exercise, increase water intake  Chronic Constipation:  He has tried and failed Metamucil daily, Colace, exercise, increase water intake  Add on Linzess '72mg'$  daily  Impotence:   Viagra 25 mg as needed  Chronic insomnia  Responding well to clonazepam 0.5-1 milligrams every night as needed,  Ruben Silva, M.D. Ph.D.  [facility] [address] [place], [place] [address] Ph: [phone] Fax: [phone]

## 2016-05-21 ENCOUNTER — Emergency Department (HOSPITAL_BASED_OUTPATIENT_CLINIC_OR_DEPARTMENT_OTHER)
Admission: EM | Admit: 2016-05-21 | Discharge: 2016-05-21 | Disposition: A | Discharge status: Home / Self Care | Payer: 59 | Attending: Emergency Medicine | Admitting: Emergency Medicine

## 2016-05-21 ENCOUNTER — Emergency Department (HOSPITAL_BASED_OUTPATIENT_CLINIC_OR_DEPARTMENT_OTHER): Payer: 59

## 2016-05-21 ENCOUNTER — Encounter (HOSPITAL_BASED_OUTPATIENT_CLINIC_OR_DEPARTMENT_OTHER): Payer: Self-pay | Admitting: Emergency Medicine

## 2016-05-21 DIAGNOSIS — I10 Essential (primary) hypertension: Secondary | ICD-10-CM | POA: Diagnosis not present

## 2016-05-21 DIAGNOSIS — G20 Parkinson's disease: Secondary | ICD-10-CM | POA: Diagnosis not present

## 2016-05-21 DIAGNOSIS — R109 Unspecified abdominal pain: Secondary | ICD-10-CM | POA: Diagnosis present

## 2016-05-21 DIAGNOSIS — Z79899 Other long term (current) drug therapy: Secondary | ICD-10-CM | POA: Diagnosis not present

## 2016-05-21 DIAGNOSIS — K5900 Constipation, unspecified: Secondary | ICD-10-CM

## 2016-05-21 MED ORDER — MAGNESIUM CITRATE PO SOLN
1.0000 | Freq: Once | ORAL | Status: AC
  Administered 2016-05-21: 1 via ORAL
  Filled 2016-05-21: qty 296

## 2016-05-21 NOTE — ED Notes (Signed)
Pt presents with c/o back pain for the past 3 weeks, "I think I am constipated", states had a small bm yesterday. States had to take 4 dulcolax tabs to have results.

## 2016-05-21 NOTE — ED Provider Notes (Signed)
[facility] Provider Note   CSN: [id number] Arrival date & time: 05/21/16  1336     History   Chief Complaint Chief Complaint  Patient presents with  . Constipation  . Abdominal Pain    HPI Ruben Silva is a 45 y.o. male.  Patient with a history of constipation presents today with a chief complaint of diffuse abdominal pain and constipation.  He states that his pain is consistent with pain that he has had with constipation in the past.  Pain feels like a cramping pain and has been present over the past couple of days.  He also reports feeling bloated.  Last normal BM was a week ago.  He had a very small hard BM yesterday.  He has tried Miralax and stool softeners without improvement.  He denies nausea, vomiting, fever, or chills.  No urinary symptoms and is urinating normally.  No history of SBO or abdominal surgeries.        Past Medical History:  Diagnosis Date  . Bell's palsy   . Constipation   . Hypertension   . Murmur   . Parkinsons (HCC)   . Weakness generalized     Patient Active Problem List   Diagnosis Date Noted  . Orthostatic dizziness 05/12/2016  . Low back pain 05/12/2016  . Chronic constipation 12/17/2015  . Chronic insomnia 12/17/2015  . Left hip pain 12/16/2015  . Parkinson's disease (HCC) 09/11/2015  . HYPERTENSION 11/21/2008  . MURMUR 11/21/2008  . CHEST PAIN, ATYPICAL 11/21/2008    Past Surgical History:  Procedure Laterality Date  . NO PAST SURGERIES         Home Medications    Prior to Admission medications   Medication Sig Start Date End Date Taking? Authorizing Provider  carbidopa-levodopa-entacapone (STALEVO) 50-200-200 MG tablet Take 1 tablet by mouth 4 (four) times daily. At 6, 11, 16, 20 02/27/16  Yes Levert Feinstein, MD  clonazePAM (KLONOPIN) 1 MG tablet Take 1 tablet (1 mg total) by mouth at bedtime. 03/09/16   Levert Feinstein, MD  linaclotide (LINZESS) 72 MCG capsule Take 1 capsule (72 mcg total) by mouth daily before  breakfast. 05/12/16   Levert Feinstein, MD  sildenafil (VIAGRA) 25 MG tablet Take 1 tablet (25 mg total) by mouth daily as needed for erectile dysfunction. 12/16/15   Levert Feinstein, MD  valsartan-hydrochlorothiazide (DIOVAN-HCT) 160-12.5 MG per tablet  01/30/15   Historical Provider, MD    Family History Family History  Problem Relation Age of Onset  . Hypertension Mother   . Liver disease Mother   . Hypercholesterolemia Mother   . Stroke Father   . Hypertension Father   . Diabetes Father     Social History Social History  Substance Use Topics  . Smoking status: Never Smoker  . Smokeless tobacco: Never Used  . Alcohol use No     Allergies   Review of patient's allergies indicates no known allergies.   Review of Systems Review of Systems  All other systems reviewed and are negative.    Physical Exam Updated Vital Signs BP 136/88 (BP Location: Left Arm)   Pulse 76   Temp 98 F (36.7 C)   Resp 20   Ht 5\' 8"  (1.727 m)   Wt 93.9 kg   SpO2 100%   BMI 31.47 kg/m   Physical Exam  Constitutional: He appears well-developed and well-nourished.  HENT:  Head: Normocephalic and atraumatic.  Neck: Normal range of motion. Neck supple.  Cardiovascular: Normal rate, regular  rhythm and normal heart sounds.   Pulmonary/Chest: Effort normal and breath sounds normal.  Abdominal: Soft. Bowel sounds are normal. He exhibits no distension and no mass. There is no tenderness. There is no rebound and no guarding. No hernia.  Musculoskeletal: Normal range of motion.  Neurological: He is alert.  Skin: Skin is warm and dry.  Psychiatric: He has a normal mood and affect.  Nursing note and vitals reviewed.    ED Treatments / Results  Labs (all labs ordered are listed, but only abnormal results are displayed) Labs Reviewed - No data to display  EKG  EKG Interpretation None       Radiology Dg Abdomen Acute W/chest  Result Date: 05/21/2016 CLINICAL DATA:  Abdominal pain and constipation  for 2 weeks. EXAM: DG ABDOMEN ACUTE W/ 1V CHEST COMPARISON:  Chest radiograph 06/03/2015 FINDINGS: Shallow lung inflation without focal airspace consolidation or pulmonary edema. Normal cardiomediastinal contours. No pneumothorax or sizable pleural effusion. No free intraperitoneal air. Moderate colonic stool. No dilated loops of small bowel are identified. No masses or abnormal calcifications are seen. IMPRESSION: 1. No free intraperitoneal air or radiographic evidence of obstruction. 2. Clear lungs. Electronically Signed   By: Deatra RobinsonKevin  Herman M.D.   On: 05/21/2016 15:41    Procedures Procedures (including critical care time)  Medications Ordered in ED Medications  magnesium citrate solution 1 Bottle (not administered)     Initial Impression / Assessment and Plan / ED Course  I have reviewed the triage vital signs and the nursing notes.  Pertinent labs & imaging results that were available during my care of the patient were reviewed by me and considered in my medical decision making (see chart for details).  Clinical Course    Final Clinical Impressions(s) / ED Diagnoses   Final diagnoses:  None   Patient presents today with diffuse abdominal pain and constipation.  No abdominal tenderness to palpation on exam.  Xray showing moderate colonic stool, but no signs of obstruction.  He has no history of abdominal surgeries.  He is not vomiting.  Patient offered enema, but declined.  Patient given Mag Citrate.  He was also recently started on Linzess.  Feel that the patient is stable for discharge.  Return precautions given.   New Prescriptions New Prescriptions   No medications on file     Santiago GladHeather Barett Whidbee, PA-C 05/23/16 1534    Tilden FossaElizabeth Rees, MD 05/24/16 2154

## 2016-05-21 NOTE — ED Notes (Signed)
States has had some nausea, but not vomiting, has appetite, but states when he eats feels bloated

## 2016-05-21 NOTE — ED Triage Notes (Signed)
Pt reports constipation and abdominal pain. States his last BM was a week ago. Reports hx of early onset of parkinson's disease. A/o at triage.

## 2016-05-21 NOTE — ED Notes (Signed)
DC instructions provided to pt, opportunity for questions provided, provided name, number of ref MD

## 2016-05-26 ENCOUNTER — Encounter: Payer: Self-pay | Admitting: Nurse Practitioner

## 2016-06-04 ENCOUNTER — Telehealth: Payer: Self-pay | Admitting: Neurology

## 2016-06-04 ENCOUNTER — Encounter: Payer: Self-pay | Admitting: Nurse Practitioner

## 2016-06-04 ENCOUNTER — Encounter: Payer: 59 | Admitting: Nurse Practitioner

## 2016-06-04 ENCOUNTER — Other Ambulatory Visit: Payer: Self-pay | Admitting: *Deleted

## 2016-06-04 MED ORDER — LINACLOTIDE 145 MCG PO CAPS: 145.0000 ug | ORAL_CAPSULE | Freq: Every day | ORAL | 11 refills | Status: DC

## 2016-06-04 NOTE — Telephone Encounter (Signed)
Pt called said he would like to increase the dose for linaclotide (LINZESS) 72 MCG capsule . Said the current dos is not helping. [facility]s [facility]

## 2016-06-04 NOTE — Telephone Encounter (Signed)
Dr. Terrace ArabiaYan has provided a new rx for Linzess 145mcg daily.  Patient agreeable to increase.

## 2016-06-25 ENCOUNTER — Encounter: Payer: Self-pay | Admitting: Neurology

## 2016-06-25 ENCOUNTER — Ambulatory Visit (INDEPENDENT_AMBULATORY_CARE_PROVIDER_SITE_OTHER): Payer: 59 | Admitting: Neurology

## 2016-06-25 ENCOUNTER — Ambulatory Visit (HOSPITAL_COMMUNITY)
Admission: RE | Admit: 2016-06-25 | Discharge: 2016-06-25 | Disposition: A | Discharge status: Home / Self Care | Payer: 59 | Source: Ambulatory Visit | Attending: Family Medicine | Admitting: Family Medicine

## 2016-06-25 ENCOUNTER — Telehealth: Payer: Self-pay | Admitting: Neurology

## 2016-06-25 ENCOUNTER — Other Ambulatory Visit: Payer: Self-pay | Admitting: *Deleted

## 2016-06-25 VITALS — BP 139/92 | HR 68 | Ht 68.0 in | Wt 200.0 lb

## 2016-06-25 DIAGNOSIS — K5909 Other constipation: Secondary | ICD-10-CM | POA: Insufficient documentation

## 2016-06-25 DIAGNOSIS — M545 Low back pain: Secondary | ICD-10-CM | POA: Diagnosis not present

## 2016-06-25 DIAGNOSIS — M79604 Pain in right leg: Secondary | ICD-10-CM | POA: Diagnosis not present

## 2016-06-25 DIAGNOSIS — G8929 Other chronic pain: Secondary | ICD-10-CM

## 2016-06-25 DIAGNOSIS — M7989 Other specified soft tissue disorders: Secondary | ICD-10-CM

## 2016-06-25 DIAGNOSIS — G20 Parkinson's disease: Secondary | ICD-10-CM | POA: Insufficient documentation

## 2016-06-25 MED ORDER — LINACLOTIDE 145 MCG PO CAPS: 290.0000 ug | ORAL_CAPSULE | Freq: Every day | ORAL | 11 refills | Status: DC

## 2016-06-25 NOTE — Progress Notes (Signed)
**  Preliminary report by tech**  Right lower extremity venous duplex completed. There is no evidence of deep or superficial vein thrombosis involving the right lower extremity. All visualized vessels appear patent and compressible. There is no evidence of a Baker's cyst on the right. Results were given to Dr. Terrace ArabiaYan.  06/25/16 5:22 PM Olen CordialGreg Broly Hatfield RVT

## 2016-06-25 NOTE — Telephone Encounter (Signed)
Pt called to advise his rt calf is swollen. He said it started around 10am this morning. He wanted to know if he could be seen today for this. I advised the pt the clinic does not see emergency situations and he should go ED. The pt agreed.

## 2016-06-25 NOTE — Telephone Encounter (Addendum)
Attempted call to patient again - he answered and was in a cab on the way to the ED. He has swelling on his right, outer calf - more numbness than pain - not hot to the touch - no reddened area around swelling.  Dr. Terrace ArabiaYan is going to work him into her schedule and have him come here instead.

## 2016-06-25 NOTE — Progress Notes (Signed)
Chief Complaint  Patient presents with  . Parkinson's Disease    He is here with concerns over swelling in his lower, right extremity that started at 10am this morning.  Denies pain in leg but he has numbness.  He has also been having intermittent back pain.       PATIENT: Ruben Silva DOB: 1970/12/18  Chief Complaint  Patient presents with  . Parkinson's Disease    He is here with concerns over swelling in his lower, right extremity that started at 10am this morning.  Denies pain in leg but he has numbness.  He has also been having intermittent back pain.    HISTORICAL  Ruben Silva 45 yo right-handed male, with early-onset idiopathic Parkinson's disease, his primary care physician Dr. Iona Beard, I saw him initially in June 2016  Since October 2015,he was noticed by the family to move at the slower pace, shuffling his gait,gradually getting worse over the past few months, he was also noticed to have right hand shaking, slow to response, decreased facial expression, to the point of needing help to pull up his pants sometimes, he also complains of orthostatic dizziness, difficulty sleeping at nighttime, fatigue, chronic constipation, impotence. 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, he has mild difficulty writing, complains of dizziness, especially with sudden positional change  MRI brain in June 2016 was normal, MRI of cervical spine, mild degenerative disc disease, no significant canal or foraminal stenosis CAT scan of the chest was normal  Laboratory in 2016 showed normal or Negative TSH, ESR, C-reactive protein RPR HIV, B12,CMP CBC, protein electrophoresis, more extensive laboratory evaluations, negative or normal, paraneoplastic panel, ceruloplasmin, copper level, vitamin B1, B12, vitamin D, CA 237, RPR, HIV, folic acid, C-reactive protein, CPK, ESR, protein electrophoresis, Lyme titer,  CBC, CMP  He was seen by [facility] movement specialist Dr. Linus Mako in September 2016, concurred with the diagnosis of early-onset idiopathic Parkinson's disease  He started sinemet since June 2016, gradually titrating dose, which did help his symptoms, he notice wearing off despite quick titrating dose of Sinemet 25/100 mg, 2 tablets 4 times a day, wearing off dyskinesia, he was started on Stalevo 200 mg since February 27 2016 at 6am, 10am, 4pm, 7pm, He goes to bed at 10pm.  But could not tolerate dopamine agonist or Azilect, Azilect-upset stomach, worsening dizziness, Requip-significant GI side effect, dizziness,  He walks regularly 2-4 miles each day.  he was taken to the emergency room in October 2016 after fall, dizziness, repeat CAT scan of the brain in October 2016 was normal  He now complains of constant left shoulder pain  UPDATE May 12 2016: Parkinson's disease: he was started on Stalevo 200 mg since February 27 2016 at 6am, 10am, 4pm, 7pm, He goes to bed at 10pm. He is very happy about current medications, denies significant bradykinesia, able to keep up with his working schedule, complains of orange color discoloration of the urine  Chronic Constipation: Once or twice each week, he took Dulcolax on the weekends,  Insomnia:  He takes clonazepam 1 mg every night works well   Orthostatic dizziness: Worsening with [place] agonist, and Azilect, improved after he stopped medications,  Update June 26 2016: He came here as outlined scheduled urgent visit, woke up in the morning noticed right lateral leg pain, increased gait difficulty because of the pain, continue have chronic constipation despite  Linzess '145mg'$  daily treatment  REVIEW OF SYSTEMS: Full 14 system review of systems performed and notable only for low back pain, blurred vision headaches  ALLERGIES: No Known Allergies  HOME MEDICATIONS: Current Outpatient Prescriptions  Medication Sig Dispense Refill  . docusate sodium  (COLACE) 100 MG capsule   3  . ibuprofen (ADVIL,MOTRIN) 200 MG tablet Take 200 mg by mouth every 6 (six) hours as needed.    . polyethylene glycol powder (GLYCOLAX/MIRALAX) powder     . valsartan-hydrochlorothiazide (DIOVAN-HCT) 160-12.5 MG per tablet        PAST MEDICAL HISTORY: Past Medical History  Diagnosis Date  . Murmur   . Hypertension   . Bell's palsy   . Constipation   . Weakness generalized     PAST SURGICAL HISTORY: Past Surgical History:  Procedure Laterality Date  . NO PAST SURGERIES      FAMILY HISTORY: Family History  Problem Relation Age of Onset  . Hypertension Mother   . Liver disease Mother   . Hypercholesterolemia Mother   . Stroke Father   . Hypertension Father   . Diabetes Father     SOCIAL HISTORY:  History   Social History  . Marital Status: Married    Spouse Name: N/A  . Number of Children: 1  . Years of Education: College   Occupational History  . Government social research officer    Social History Main Topics  . Smoking status: Never Smoker   . Smokeless tobacco: Not on file  . Alcohol Use: No  . Drug Use: No  . Sexual Activity: Not on file   Other Topics Concern  . Not on file   Social History Narrative   Lives at home with wife and son.   Right-hand.   Occasional use of caffeine.     PHYSICAL EXAM   Vitals:   06/25/16 1311  BP: (!) 139/92  Pulse: 68  Weight: 200 lb (90.7 kg)  Height: '5\' 8"'$  (1.727 m)    Not recorded      Body mass index is 30.41 kg/m.  PHYSICAL EXAMNIATION: This exam is performed 5 hours after last dose of Stalevo 200  Gen: NAD, conversant, well nourised, obese, well groomed                     Cardiovascular: Regular rate rhythm, no peripheral edema, warm, nontender. Eyes: Conjunctivae clear without exudates or hemorrhage Neck: Supple, no carotid bruise. Pulmonary: Clear to auscultation bilaterally   NEUROLOGICAL EXAM:  MENTAL STATUS: decreased facial expression, mild masked face, Speech:    Speech  is normal; fluent and spontaneous with normal comprehension.  Cognition:    The patient is oriented to person, place, and time;     recent and remote memory intact;     language fluent;     normal attention, concentration,     fund of knowledge.  CRANIAL NERVES: CN II: Visual fields are full to confrontation. Fundoscopic exam is normal with sharp discs and no vascular changes. Pupil equal round reactive to light CN III, IV, VI: extraocular movement are normal. No ptosis. CN V: Facial sensation is intact to pinprick in all 3 divisions bilaterally. Corneal responses are intact.  CN VII: Face is symmetric with normal eye closure and smile. CN VIII: Hearing is normal to rubbing fingers CN IX, X: Palate elevates symmetrically. Phonation is normal. CN XI: Head turning and shoulder shrug are intact CN XII: Tongue is midline with normal movements and no atrophy.  MOTOR:  He has mild bilateral upper and lower extremity, nuchal rigidity, right slightly worse than the left, mild bradykinesia. Tenderness of right lateral leg on deep palpitation  REFLEXES: Reflexes are 2+ and symmetric at the biceps, triceps, knees, and ankles. Plantar responses are flexor.  SENSORY: Light touch, pinprick, position sense, and vibration sense are intact in fingers and toes.  COORDINATION: Rapid alternating movements and fine finger movements are intact. There is no dysmetria on finger-to-nose and heel-knee-shin.   GAIT/STANCE: He has mildly decreased right arm swing, moderate stride, is most turning.   DIAGNOSTIC DATA (LABS, IMAGING, TESTING) - I reviewed patient records, labs, notes, testing and imaging myself where available.  ASSESSMENT AND PLAN  RANVEER WAHLSTROM is a 45 y.o. male    Early onset idiopathic Parkinson's disease  Keep Stalevo 50/200/200 4 times a day  Could not tolerate azilect, dopamine agonist Requip, due to GI side effect, increased orthostatic dizziness    Orthostatic  dizziness  Encouraging moderate exercise, increase water intake  Chronic Constipation:  He has tried and failed Metamucil daily, Colace, exercise, increase water intake  Increase Linzess to '145mg'$  bid Impotence:   Viagra 25 mg as needed  Chronic insomnia  Responding well to clonazepam 0.5-1 milligrams every night as needed,  New onset low back pain, right lateral leg pain.  Doppler of right leg  MRI lumbar  Marcial Pacas, M.D. Ph.D.  [facility] [address] [place], [place] [address] Ph: [phone] Fax: [phone]

## 2016-06-25 NOTE — Telephone Encounter (Addendum)
Tried to reach patient by phone - left message for a return call to try to avoid him having to go the ED, prior to us speaking with him about his symptoms.

## 2016-06-26 ENCOUNTER — Telehealth: Payer: Self-pay | Admitting: Neurology

## 2016-06-26 NOTE — Telephone Encounter (Signed)
I have spoken with Ruben Silva this morning, and per YY, advised that doppler showed no evidence of blood clot or cyst.  He should take NSAID's after meals for discomfort, and apply hot compresses to area.  He verbalized understanding of same/fim

## 2016-06-26 NOTE — Telephone Encounter (Signed)
Please call patient: Ultrasound of right lower extremity showed evidence of muscle bruise, at right lateral leg, but there was no evidence of venous thrombosis   Please advise him take as needed NSAIDs, after meal, hot compression,   - No evidence of deep vein or superficial thrombosis involving the right lower extremity and left common femoral vein. - No evidence of Baker&'s cyst on the right.

## 2016-06-29 ENCOUNTER — Other Ambulatory Visit: Payer: Self-pay | Admitting: *Deleted

## 2016-06-29 MED ORDER — LINACLOTIDE 290 MCG PO CAPS: 290.0000 ug | ORAL_CAPSULE | Freq: Every day | ORAL | 11 refills | Status: DC

## 2016-07-20 ENCOUNTER — Telehealth: Payer: Self-pay | Admitting: Neurology

## 2016-07-20 NOTE — Telephone Encounter (Signed)
[facility] [phone] called to request early refill of clonazePAM (KLONOPIN) 1 MG tablet, last refill was 10/26, patient is going out of town for the holiday's.

## 2016-07-20 NOTE — Telephone Encounter (Signed)
Patient is traveling for the holidays and will run out of his medication while out-of-town.  [facility] [place] [facility] and [facility] will provide the [facility] with an early refill.  He is aware that this will not change next month's refill date.

## 2016-07-31 ENCOUNTER — Other Ambulatory Visit: Payer: 59

## 2016-08-03 NOTE — Progress Notes (Signed)
This encounter was created in error - please disregard.

## 2016-08-12 ENCOUNTER — Ambulatory Visit (INDEPENDENT_AMBULATORY_CARE_PROVIDER_SITE_OTHER): Payer: 59 | Admitting: Neurology

## 2016-08-12 ENCOUNTER — Encounter: Payer: Self-pay | Admitting: Neurology

## 2016-08-12 VITALS — BP 120/71 | HR 65 | Ht 68.0 in | Wt 204.0 lb

## 2016-08-12 DIAGNOSIS — G8929 Other chronic pain: Secondary | ICD-10-CM | POA: Diagnosis not present

## 2016-08-12 DIAGNOSIS — F5104 Psychophysiologic insomnia: Secondary | ICD-10-CM | POA: Diagnosis not present

## 2016-08-12 DIAGNOSIS — K5909 Other constipation: Secondary | ICD-10-CM

## 2016-08-12 DIAGNOSIS — M545 Low back pain: Secondary | ICD-10-CM

## 2016-08-12 DIAGNOSIS — G20 Parkinson's disease: Secondary | ICD-10-CM | POA: Diagnosis not present

## 2016-08-12 MED ORDER — CLONAZEPAM 1 MG PO TABS: 2.0000 mg | ORAL_TABLET | Freq: Every day | ORAL | 5 refills | Status: DC

## 2016-08-12 MED ORDER — LINACLOTIDE 290 MCG PO CAPS: 290.0000 ug | ORAL_CAPSULE | Freq: Every day | ORAL | 11 refills | Status: DC

## 2016-08-12 MED ORDER — CARBIDOPA-LEVODOPA-ENTACAPONE 50-200-200 MG PO TABS: 1.0000 | ORAL_TABLET | Freq: Every day | ORAL | 11 refills | Status: DC

## 2016-08-12 NOTE — Progress Notes (Signed)
Chief Complaint  Patient presents with  . Parkinson's Disease    He is having periods of extreme fatigue daily.  When this occurs, he is unable to use his hands, legs stiffen and his body is weak. His constipation is better with Linzess.       PATIENT: Ruben Silva DOB: 08/11/1971  Chief Complaint  Patient presents with  . Parkinson's Disease    He is having periods of extreme fatigue daily.  When this occurs, he is unable to use his hands, legs stiffen and his body is weak. His constipation is better with Linzess.    HISTORICAL  Ruben Silva 45 yo right-handed male, with early-onset idiopathic Parkinson's disease, his primary care physician Dr. Mirna Mires, I saw him initially in June 2016  Since October 2015,he was noticed by the family to move at the slower pace, shuffling his gait,gradually getting worse over the past few months, he was also noticed to have right hand shaking, slow to response, decreased facial expression, to the point of needing help to pull up his pants sometimes, he also complains of orthostatic dizziness, difficulty sleeping at nighttime, fatigue, chronic constipation, impotence. 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, he has mild difficulty writing, complains of dizziness, especially with sudden positional change  MRI brain in June 2016 was normal, MRI of cervical spine, mild degenerative disc disease, no significant canal or foraminal stenosis CAT scan of the chest was normal  Laboratory in 2016 showed normal or Negative TSH, ESR, C-reactive protein RPR HIV, B12,CMP CBC, protein electrophoresis, more extensive laboratory evaluations, negative or normal, paraneoplastic panel, ceruloplasmin, copper level, vitamin B1, B12, vitamin D, CA 125, RPR, HIV, folic acid, C-reactive protein, CPK, ESR, protein electrophoresis, Lyme titer, CBC, CMP  He was seen by [facility]  movement specialist [name] in September 2016, concurred with the diagnosis of early-onset idiopathic Parkinson's disease  He started sinemet since June 2016, gradually titrating dose, which did help his symptoms, he notice wearing off despite quick titrating dose of Sinemet 25/100 mg, 2 tablets 4 times a day, wearing off dyskinesia, he was started on Stalevo 200 mg since February 27 2016 at 6am, 10am, 4pm, 7pm, He goes to bed at 10pm.  But could not tolerate dopamine agonist or Azilect, Azilect-upset stomach, worsening dizziness, Requip-significant GI side effect, dizziness,  He walks regularly 2-4 miles each day.  he was taken to the emergency room in October 2016 after fall, dizziness, repeat CAT scan of the brain in October 2016 was normal  He now complains of constant left shoulder pain  UPDATE May 12 2016: Parkinson's disease: he was started on Stalevo 200 mg since February 27 2016 at 6am, 10am, 4pm, 7pm, He goes to bed at 10pm. He is very happy about current medications, denies significant bradykinesia, able to keep up with his working schedule, complains of orange color discoloration of the urine  Chronic Constipation: Once or twice each week, he took Dulcolax on the weekends,  Insomnia:  He takes clonazepam 1 mg every night works well   Orthostatic dizziness: Worsening with [place] agonist, and Azilect, improved after he stopped medications,  Update June 26 2016: He came here as outlined scheduled urgent visit, woke up in the morning noticed right lateral leg pain, increased gait difficulty because of the pain, continue have chronic constipation despite Linzess 145mg  daily treatment  UPDATE Aug 12 2016: He noticed wearing off of his levodopa before the next dose, he is taking Stalevo 50/200/200 at 6 AM,11am, 4pm, 8pm, "hit a brick wall around 11am every morning".   REVIEW OF SYSTEMS: Full 14 system review of systems performed and notable only for low back pain, blurred vision  headaches  ALLERGIES: No Known Allergies  HOME MEDICATIONS: Current Outpatient Prescriptions  Medication Sig Dispense Refill  . docusate sodium (COLACE) 100 MG capsule   3  . ibuprofen (ADVIL,MOTRIN) 200 MG tablet Take 200 mg by mouth every 6 (six) hours as needed.    . polyethylene glycol powder (GLYCOLAX/MIRALAX) powder     . valsartan-hydrochlorothiazide (DIOVAN-HCT) 160-12.5 MG per tablet        PAST MEDICAL HISTORY: Past Medical History  Diagnosis Date  . Murmur   . Hypertension   . Bell's palsy   . Constipation   . Weakness generalized     PAST SURGICAL HISTORY: Past Surgical History:  Procedure Laterality Date  . NO PAST SURGERIES      FAMILY HISTORY: Family History  Problem Relation Age of Onset  . Hypertension Mother   . Liver disease Mother   . Hypercholesterolemia Mother   . Stroke Father   . Hypertension Father   . Diabetes Father     SOCIAL HISTORY:  History   Social History  . Marital Status: Married    Spouse Name: N/A  . Number of Children: 1  . Years of Education: College   Occupational History  . Government social research officer    Social History Main Topics  . Smoking status: Never Smoker   . Smokeless tobacco: Not on file  . Alcohol Use: No  . Drug Use: No  . Sexual Activity: Not on file   Other Topics Concern  . Not on file   Social History Narrative   Lives at home with wife and son.   Right-hand.   Occasional use of caffeine.     PHYSICAL EXAM   Vitals:   08/12/16 1437  BP: 120/71  Pulse: 65  Weight: 204 lb (92.5 kg)  Height: '5\' 8"'$  (1.727 m)    Not recorded      Body mass index is 31.02 kg/m.  PHYSICAL EXAMNIATION: This exam is performed 4 hours after last dose of Stalevo 200  Gen: NAD, conversant, well nourised, obese, well groomed                     Cardiovascular: Regular rate rhythm, no peripheral edema, warm, nontender. Eyes: Conjunctivae clear without exudates or hemorrhage Neck: Supple, no carotid  bruise. Pulmonary: Clear to auscultation bilaterally   NEUROLOGICAL EXAM:  MENTAL STATUS: decreased facial expression, mild masked face, Speech:    Speech is normal; fluent and spontaneous with normal comprehension.  Cognition:    The patient is oriented to person, place, and time;     recent and remote memory intact;     language fluent;     normal attention, concentration,     fund of knowledge.  CRANIAL NERVES: CN II: Visual fields are full to confrontation. Fundoscopic exam is normal with sharp discs and no vascular changes. Pupil equal round reactive to light CN III, IV, VI: extraocular movement are normal. No ptosis. CN V: Facial sensation is intact to pinprick in all 3 divisions bilaterally. Corneal responses are intact.  CN VII: Face is symmetric with normal eye closure and smile. CN VIII: Hearing is normal to rubbing fingers CN IX, X: Palate elevates  symmetrically. Phonation is normal. CN XI: Head turning and shoulder shrug are intact CN XII: Tongue is midline with normal movements and no atrophy.  MOTOR: He has mild to moderate bilateral upper and lower extremity, nuchal rigidity, right slightly worse than the left, mild bradykinesia. Tenderness of right lateral leg on deep palpitation  REFLEXES: Reflexes are 2+ and symmetric at the biceps, triceps, knees, and ankles. Plantar responses are flexor.  SENSORY: Light touch, pinprick, position sense, and vibration sense are intact in fingers and toes.  COORDINATION: Rapid alternating movements and fine finger movements are intact. There is no dysmetria on finger-to-nose and heel-knee-shin.   GAIT/STANCE: He has decreased bilateral arm swing, right worse than left, decreased stride, mild enblock turning.   DIAGNOSTIC DATA (LABS, IMAGING, TESTING) - I reviewed patient records, labs, notes, testing and imaging myself where available.  ASSESSMENT AND PLAN  Ruben Silva is a 45 y.o. male    Early onset idiopathic  Parkinson's disease  increase Stalevo to 50/200/200 5 times a day at 6, 9, 14, 42, 20.  Could not tolerate azilect, dopamine agonist Requip, due to GI side effect, increased orthostatic dizziness    Orthostatic dizziness  Encouraging moderate exercise, increase water intake  Chronic Constipation:  He has tried and failed Metamucil daily, Colace, exercise, increase water intake  Increase Linzess to 290 mg daily  Impotence:   Viagra 25 mg as needed  Chronic insomnia  Responding well to clonazepam 76mlligrams 2 tabs every night, refilled his prescription  New onset low back pain, right lateral leg pain.  MRI lumbar  YMarcial Pacas M.D. Ph.D.  [facility] [id number] [place] [address]Ph: [phone]: [phone]

## 2016-08-27 ENCOUNTER — Encounter (HOSPITAL_BASED_OUTPATIENT_CLINIC_OR_DEPARTMENT_OTHER): Payer: Self-pay | Admitting: *Deleted

## 2016-08-27 ENCOUNTER — Emergency Department (HOSPITAL_BASED_OUTPATIENT_CLINIC_OR_DEPARTMENT_OTHER)
Admission: EM | Admit: 2016-08-27 | Discharge: 2016-08-28 | Disposition: A | Discharge status: Home / Self Care | Payer: 59 | Attending: Emergency Medicine | Admitting: Emergency Medicine

## 2016-08-27 DIAGNOSIS — G20 Parkinson's disease: Secondary | ICD-10-CM | POA: Diagnosis not present

## 2016-08-27 DIAGNOSIS — N644 Mastodynia: Secondary | ICD-10-CM | POA: Diagnosis present

## 2016-08-27 DIAGNOSIS — N61 Mastitis without abscess: Secondary | ICD-10-CM

## 2016-08-27 DIAGNOSIS — Z79899 Other long term (current) drug therapy: Secondary | ICD-10-CM | POA: Diagnosis not present

## 2016-08-27 DIAGNOSIS — I10 Essential (primary) hypertension: Secondary | ICD-10-CM | POA: Diagnosis not present

## 2016-08-27 MED ORDER — SULFAMETHOXAZOLE-TRIMETHOPRIM 800-160 MG PO TABS: 1.0000 | ORAL_TABLET | Freq: Two times a day (BID) | ORAL | 0 refills | Status: AC

## 2016-08-27 MED ORDER — CEPHALEXIN 500 MG PO CAPS: 500.0000 mg | ORAL_CAPSULE | Freq: Four times a day (QID) | ORAL | 0 refills | Status: DC

## 2016-08-27 NOTE — ED Provider Notes (Signed)
[facility] Provider Note   CSN: [id number] Arrival date & time: 08/27/16  2238  By signing my name below, I, Linna DarnerRussell Turner, attest that this documentation has been prepared under the direction and in the presence of physician practitioner, Geoffery Lyonsouglas Jaleen Finch, MD. Electronically Signed: Linna Darnerussell Turner, Scribe. 08/27/2016. 11:23 PM.  History   Chief Complaint Chief Complaint  Patient presents with  . Cellulitis    The history is provided by the patient. No language interpreter was used.     HPI Comments: Ruben Silva is a 45 y.o. male who presents to the Emergency Department complaining of an abscess around his left nipple beginning 2 days ago. He reports associated pain, redness, warmth, and "hardness" to the area around left nipple. He reports pain exacerbation with palpation around his left nipple. He took Bayer aspirin PTA with no relief of his pain. No h/o diabetes. He denies drainage from the abscess, fever, chills, or any other associated symptoms.  Past Medical History:  Diagnosis Date  . Bell's palsy   . Constipation   . Hypertension   . Murmur   . Parkinsons (HCC)   . Weakness generalized     Patient Active Problem List   Diagnosis Date Noted  . Orthostatic dizziness 05/12/2016  . Low back pain 05/12/2016  . Chronic constipation 12/17/2015  . Chronic insomnia 12/17/2015  . Left hip pain 12/16/2015  . Parkinson's disease (HCC) 09/11/2015  . HYPERTENSION 11/21/2008  . MURMUR 11/21/2008  . CHEST PAIN, ATYPICAL 11/21/2008    Past Surgical History:  Procedure Laterality Date  . NO PAST SURGERIES         Home Medications    Prior to Admission medications   Medication Sig Start Date End Date Taking? Authorizing Provider  carbidopa-levodopa-entacapone (STALEVO) 50-200-200 MG tablet Take 1 tablet by mouth 5 (five) times daily. At 6, 11, 16, 20 08/12/16   Levert FeinsteinYijun [name], MD  clonazePAM (KLONOPIN) 1 MG tablet Take 2 tablets (2 mg total) by mouth at  bedtime. 08/12/16   Levert FeinsteinYijun [name], MD  linaclotide Karlene Einstein(LINZESS) 290 MCG CAPS capsule Take 1 capsule (290 mcg total) by mouth daily before breakfast. 08/12/16   Levert FeinsteinYijun [name], MD  sildenafil (VIAGRA) 25 MG tablet Take 1 tablet (25 mg total) by mouth daily as needed for erectile dysfunction. 12/16/15   Levert FeinsteinYijun [name], MD  valsartan-hydrochlorothiazide (DIOVAN-HCT) 160-12.5 MG per tablet  01/30/15   Historical Provider, MD    Family History Family History  Problem Relation Age of Onset  . Hypertension Mother   . Liver disease Mother   . Hypercholesterolemia Mother   . Stroke Father   . Hypertension Father   . Diabetes Father     Social History Social History  Substance Use Topics  . Smoking status: Never Smoker  . Smokeless tobacco: Never Used  . Alcohol use No     Allergies   Patient has no known allergies.   Review of Systems Review of Systems  Constitutional: Negative for chills and fever.  Musculoskeletal: Positive for myalgias (left nipple secondary to abscess).  All other systems reviewed and are negative.    Physical Exam Updated Vital Signs BP 134/85   Pulse 66   Temp 97.6 F (36.4 C) (Oral)   Resp 20   Ht 5\' 8"  (1.727 m)   Wt 204 lb (92.5 kg)   SpO2 100%   BMI 31.02 kg/m   Physical Exam  Constitutional: He is oriented to person, place, and time. He appears well-developed and well-nourished.  No distress.  HENT:  Head: Normocephalic and atraumatic.  Right Ear: Hearing normal.  Left Ear: Hearing normal.  Nose: Nose normal.  Mouth/Throat: Oropharynx is clear and moist and mucous membranes are normal.  Eyes: Conjunctivae and EOM are normal. Pupils are equal, round, and reactive to light.  Neck: Normal range of motion. Neck supple.  Cardiovascular: Regular rhythm, S1 normal and S2 normal.  Exam reveals no gallop and no friction rub.   No murmur heard. Pulmonary/Chest: Effort normal and breath sounds normal. No respiratory distress. He exhibits no tenderness.  Abdominal:  Soft. Normal appearance and bowel sounds are normal. There is no hepatosplenomegaly. There is no tenderness. There is no rebound, no guarding, no tenderness at McBurney's point and negative Murphy's sign. No hernia.  Musculoskeletal: Normal range of motion.  Neurological: He is alert and oriented to person, place, and time. He has normal strength. No cranial nerve deficit or sensory deficit. Coordination normal. GCS eye subscore is 4. GCS verbal subscore is 5. GCS motor subscore is 6.  Skin: Skin is warm, dry and intact. No rash noted. No cyanosis.  There is induration, redness, and tenderness lateral to the left nipple. There is no fluctuance or definite abscess palpable. The area of erythema measures approximately 2 cm with mild streaking in the direction of the axilla.  Psychiatric: He has a normal mood and affect. His speech is normal and behavior is normal. Thought content normal.  Nursing note and vitals reviewed.    ED Treatments / Results  Labs (all labs ordered are listed, but only abnormal results are displayed) Labs Reviewed - No data to display  EKG  EKG Interpretation None       Radiology No results found.  Procedures Procedures (including critical care time)  DIAGNOSTIC STUDIES: Oxygen Saturation is 100% on RA, normal by my interpretation.    COORDINATION OF CARE: 11:32 PM Discussed treatment plan with pt at bedside and pt agreed to plan.  Medications Ordered in ED Medications - No data to display   Initial Impression / Assessment and Plan / ED Course  I have reviewed the triage vital signs and the nursing notes.  Pertinent labs & imaging results that were available during my care of the patient were reviewed by me and considered in my medical decision making (see chart for details).  Clinical Course     This appears to be a cellulitis of the left breast. There is some palpable induration, however no definite fluctuance. This may be developing into an  abscess, however I see nothing at this time to drain or incise. He will be treated with antibiotics, warm soaks, and when necessary return if he worsens, follow-up with PCP if he does not improve.  Final Clinical Impressions(s) / ED Diagnoses   Final diagnoses:  Cellulitis of breast of male    New Prescriptions New Prescriptions   No medications on file   I personally performed the services described in this documentation, which was scribed in my presence. The recorded information has been reviewed and is accurate.       Geoffery Lyonsouglas Caileen Veracruz, MD 08/28/16 [phone]

## 2016-08-27 NOTE — Discharge Instructions (Signed)
Keflex and Bactrim as prescribed.  Apply warm soaks as frequently as possible for the next several days.  Return to the emergency department if swelling or redness significantly worsens, you develop fevers, worsening pain, or other new and concerning symptoms.

## 2016-08-27 NOTE — ED Triage Notes (Signed)
Redness and hot to touch area around his left nipple. He noticed it x 2 days ago.

## 2016-08-28 NOTE — ED Notes (Signed)
Pt verbalizes understanding of d/c instructions and denies any further needs at this time. 

## 2016-09-03 ENCOUNTER — Telehealth: Payer: Self-pay | Admitting: Neurology

## 2016-09-03 NOTE — Telephone Encounter (Signed)
Spoke to patient - he was diagnosed w/ cellulitis on 08/27/16.  He is on his last day of antibiotics without much of an improvement.  Instructed him to contact PCP today.  He was agreeable to this plan.

## 2016-09-03 NOTE — Telephone Encounter (Signed)
Patient states he went to the hospital last Thursday and was told he had an abstract bacteria infection. He would like to discuss this. He also has a cold and it is getting worse.

## 2016-11-10 ENCOUNTER — Ambulatory Visit (INDEPENDENT_AMBULATORY_CARE_PROVIDER_SITE_OTHER): Payer: 59 | Admitting: Neurology

## 2016-11-10 ENCOUNTER — Encounter: Payer: Self-pay | Admitting: Neurology

## 2016-11-10 VITALS — BP 120/73 | HR 91 | Ht 68.0 in | Wt 202.5 lb

## 2016-11-10 DIAGNOSIS — F5104 Psychophysiologic insomnia: Secondary | ICD-10-CM

## 2016-11-10 DIAGNOSIS — G20 Parkinson's disease: Secondary | ICD-10-CM | POA: Diagnosis not present

## 2016-11-10 DIAGNOSIS — K5909 Other constipation: Secondary | ICD-10-CM

## 2016-11-10 DIAGNOSIS — R42 Dizziness and giddiness: Secondary | ICD-10-CM | POA: Diagnosis not present

## 2016-11-10 MED ORDER — CITRULLINE 600 MG PO CAPS: 600.0000 mg | ORAL_CAPSULE | Freq: Two times a day (BID) | ORAL | 11 refills | Status: DC

## 2016-11-10 MED ORDER — DOCUSATE SODIUM 250 MG PO CAPS: 250.0000 mg | ORAL_CAPSULE | Freq: Every day | ORAL | 11 refills | Status: DC

## 2016-11-10 MED ORDER — RIZATRIPTAN BENZOATE 5 MG PO TBDP: 5.0000 mg | ORAL_TABLET | ORAL | 6 refills | Status: DC | PRN

## 2016-11-10 MED ORDER — PROPRANOLOL HCL 80 MG PO TABS: 80.0000 mg | ORAL_TABLET | Freq: Two times a day (BID) | ORAL | 11 refills | Status: DC

## 2016-11-10 NOTE — Progress Notes (Signed)
Chief Complaint  Patient presents with  . Parkinson's Disease    He is here with his wife, Vivien Rota.  Reports fatigue to still be problematic for him, especially around 10am and 3pm.  . Chronic constipation    Says Linzess has stopped working and constipation has been worse.  Feels it contributes to his back pain.  . Chronic Insomnia    Clonazepam is working well for him.       PATIENT: Ruben Silva DOB: 10/23/1970  Chief Complaint  Patient presents with  . Parkinson's Disease    He is here with his wife, Vivien Rota.  Reports fatigue to still be problematic for him, especially around 10am and 3pm.  . Chronic constipation    Says Linzess has stopped working and constipation has been worse.  Feels it contributes to his back pain.  . Chronic Insomnia    Clonazepam is working well for him.    HISTORICAL  Ruben Silva 46 yo right-handed male, with early-onset idiopathic Parkinson's disease, his primary care physician Dr. Iona Beard, I saw him initially in June 2016  Since October 2015,he was noticed by the family to move at the slower pace, shuffling his gait,gradually getting worse over the past few months, he was also noticed to have right hand shaking, slow to response, decreased facial expression, to the point of needing help to pull up his pants sometimes, he also complains of orthostatic dizziness, difficulty sleeping at nighttime, fatigue, chronic constipation, impotence. 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, he has mild difficulty writing, complains of dizziness, especially with sudden positional change  MRI brain in June 2016 was normal, MRI of cervical spine, mild degenerative disc disease, no significant canal or foraminal stenosis CAT scan of the chest was normal  Laboratory in 2016 showed normal or Negative TSH, ESR, C-reactive protein RPR HIV, B12,CMP CBC, protein electrophoresis,  more extensive laboratory evaluations, negative or normal, paraneoplastic panel, ceruloplasmin, copper level, vitamin B1, B12, vitamin D, CA 222, RPR, HIV, folic acid, C-reactive protein, CPK, ESR, protein electrophoresis, Lyme titer, CBC, CMP  He was seen by [facility] movement specialist Dr. Linus Mako in September 2016, concurred with the diagnosis of early-onset idiopathic Parkinson's disease  He started sinemet since June 2016, gradually titrating dose, which did help his symptoms, he notice wearing off despite quick titrating dose of Sinemet 25/100 mg, 2 tablets 4 times a day, wearing off dyskinesia, he was started on Stalevo 200 mg since February 27 2016 at 6am, 10am, 4pm, 7pm, He goes to bed at 10pm.  But could not tolerate dopamine agonist or Azilect, Azilect-upset stomach, worsening dizziness, Requip-significant GI side effect, dizziness,  He walks regularly 2-4 miles each day.  he was taken to the emergency room in October 2016 after fall, dizziness, repeat CAT scan of the brain in October 2016 was normal  He now complains of constant left shoulder pain  UPDATE May 12 2016: Parkinson's disease: he was started on Stalevo 200 mg since February 27 2016 at 6am, 10am, 4pm, 7pm, He goes to bed at 10pm. He is very happy about current medications, denies significant bradykinesia, able to keep up with his working schedule, complains of orange color discoloration of the urine  Chronic Constipation: Once or twice each week, he took Dulcolax on the weekends,  Insomnia:  He takes clonazepam 1 mg every night works well   Orthostatic dizziness:  Worsening with [place] agonist, and Azilect, improved after he stopped medications,  Update June 26 2016: He came here as outlined scheduled urgent visit, woke up in the morning noticed right lateral leg pain, increased gait difficulty because of the pain, continue have chronic constipation despite Linzess '145mg'$  daily treatment  UPDATE Aug 12 2016: He noticed  wearing off of his levodopa before the next dose, he is taking Stalevo 50/200/200 at 6 AM,11am, 4pm, 8pm, "hit a brick wall around 11am every morning".   UPDATE November 10 2016: He complains of worsening slow movement, is now taking Stalevo 50-200-200 5 times a day, he denies significant side effect from medications,  He complains worsening constipation despite Linzess 290 mcg daily,  He also complains of significant headaches, has been taking Excedrin Migraine once a day for 2 weeks,  he also complains of orthostatic dizziness, lightheaded when getting up quickly, there was one passing out episodes when he first had up from prolonged sitting.  He sleeps well with clonazepam 2 mg every night.    REVIEW OF SYSTEMS: Full 14 system review of systems performed and notable only for low back pain, blurred vision headaches  ALLERGIES: No Known Allergies  HOME MEDICATIONS: Current Outpatient Prescriptions  Medication Sig Dispense Refill  . docusate sodium (COLACE) 100 MG capsule   3  . ibuprofen (ADVIL,MOTRIN) 200 MG tablet Take 200 mg by mouth every 6 (six) hours as needed.    . polyethylene glycol powder (GLYCOLAX/MIRALAX) powder     . valsartan-hydrochlorothiazide (DIOVAN-HCT) 160-12.5 MG per tablet        PAST MEDICAL HISTORY: Past Medical History  Diagnosis Date  . Murmur   . Hypertension   . Bell's palsy   . Constipation   . Weakness generalized     PAST SURGICAL HISTORY: Past Surgical History:  Procedure Laterality Date  . NO PAST SURGERIES      FAMILY HISTORY: Family History  Problem Relation Age of Onset  . Hypertension Mother   . Liver disease Mother   . Hypercholesterolemia Mother   . Stroke Father   . Hypertension Father   . Diabetes Father     SOCIAL HISTORY:  History   Social History  . Marital Status: Married    Spouse Name: N/A  . Number of Children: 1  . Years of Education: College   Occupational History  . Government social research officer    Social History  Main Topics  . Smoking status: Never Smoker   . Smokeless tobacco: Not on file  . Alcohol Use: No  . Drug Use: No  . Sexual Activity: Not on file   Other Topics Concern  . Not on file   Social History Narrative   Lives at home with wife and son.   Right-hand.   Occasional use of caffeine.     PHYSICAL EXAM   Vitals:   11/10/16 1456  BP: 120/73  Pulse: 91  Weight: 202 lb 8 oz (91.9 kg)  Height: '5\' 8"'$  (1.727 m)    Not recorded      Body mass index is 30.79 kg/m.  PHYSICAL EXAMNIATION: This exam is performed 4 hours after last dose of Stalevo 200  Gen: NAD, conversant, well nourised, obese, well groomed                     Cardiovascular: Regular rate rhythm, no peripheral edema, warm, nontender. Eyes: Conjunctivae clear without exudates or hemorrhage Neck: Supple, no carotid bruise. Pulmonary: Clear to auscultation bilaterally  NEUROLOGICAL EXAM:  MENTAL STATUS: decreased facial expression, mild masked face, Speech:    Speech is normal; fluent and spontaneous with normal comprehension.  Cognition:    The patient is oriented to person, place, and time;     recent and remote memory intact;     language fluent;     normal attention, concentration,     fund of knowledge.  CRANIAL NERVES: CN II: Visual fields are full to confrontation. Fundoscopic exam is normal with sharp discs and no vascular changes. Pupil equal round reactive to light CN III, IV, VI: extraocular movement are normal. No ptosis. CN V: Facial sensation is intact to pinprick in all 3 divisions bilaterally. Corneal responses are intact.  CN VII: Face is symmetric with normal eye closure and smile. CN VIII: Hearing is normal to rubbing fingers CN IX, X: Palate elevates symmetrically. Phonation is normal. CN XI: Head turning and shoulder shrug are intact CN XII: Tongue is midline with normal movements and no atrophy.  MOTOR: He has mild to moderate bilateral upper and lower extremity, nuchal  rigidity, right slightly worse than the left, mild bradykinesia. Tenderness of right lateral leg on deep palpitation  REFLEXES: Reflexes are 2+ and symmetric at the biceps, triceps, knees, and ankles. Plantar responses are flexor.  SENSORY: Light touch, pinprick, position sense, and vibration sense are intact in fingers and toes.  COORDINATION: Rapid alternating movements and fine finger movements are intact. There is no dysmetria on finger-to-nose and heel-knee-shin.   GAIT/STANCE: He has decreased bilateral arm swing, right worse than left, decreased stride, mild enblock turning.   DIAGNOSTIC DATA (LABS, IMAGING, TESTING) - I reviewed patient records, labs, notes, testing and imaging myself where available.  ASSESSMENT AND PLAN  Ruben Silva is a 46 y.o. male    Early onset idiopathic Parkinson's disease  increase Stalevo to 50/200/200 5 times a day at 6, 9, 14, 2, 20.  Could not tolerate azilect, dopamine agonist Requip, due to GI side effect, increased orthostatic dizziness    Orthostatic dizziness  Encouraging moderate exercise, increase water intake  Chronic Constipation:  combine Linzess to 290 mcg daily with Metamucil and Colace every day   Impotence:   Viagra 25 mg as needed  Chronic insomnia  Responding well to clonazepam 42mlligrams 2 tabs every night, refilled his prescription  Worsening migraine headaches   we will stop Diovan-HCT, change to propranolol '80mg'$  bid for migraine prevention  Maxalt prn.   YMarcial Pacas M.D. Ph.D.  [facility] [address] [place] [id number]: [phone]: [phone]

## 2016-12-25 ENCOUNTER — Emergency Department (HOSPITAL_BASED_OUTPATIENT_CLINIC_OR_DEPARTMENT_OTHER)
Admission: EM | Admit: 2016-12-25 | Discharge: 2016-12-25 | Disposition: A | Discharge status: Home / Self Care | Payer: 59 | Attending: Emergency Medicine | Admitting: Emergency Medicine

## 2016-12-25 ENCOUNTER — Emergency Department (HOSPITAL_BASED_OUTPATIENT_CLINIC_OR_DEPARTMENT_OTHER): Payer: 59

## 2016-12-25 ENCOUNTER — Encounter (HOSPITAL_BASED_OUTPATIENT_CLINIC_OR_DEPARTMENT_OTHER): Payer: Self-pay | Admitting: *Deleted

## 2016-12-25 DIAGNOSIS — R42 Dizziness and giddiness: Secondary | ICD-10-CM | POA: Insufficient documentation

## 2016-12-25 DIAGNOSIS — R195 Other fecal abnormalities: Secondary | ICD-10-CM | POA: Insufficient documentation

## 2016-12-25 DIAGNOSIS — G510 Bell's palsy: Secondary | ICD-10-CM | POA: Diagnosis not present

## 2016-12-25 DIAGNOSIS — R1032 Left lower quadrant pain: Secondary | ICD-10-CM | POA: Diagnosis not present

## 2016-12-25 DIAGNOSIS — K5900 Constipation, unspecified: Secondary | ICD-10-CM | POA: Diagnosis not present

## 2016-12-25 DIAGNOSIS — I10 Essential (primary) hypertension: Secondary | ICD-10-CM | POA: Diagnosis not present

## 2016-12-25 DIAGNOSIS — Z79899 Other long term (current) drug therapy: Secondary | ICD-10-CM | POA: Insufficient documentation

## 2016-12-25 DIAGNOSIS — G20 Parkinson's disease: Secondary | ICD-10-CM | POA: Diagnosis not present

## 2016-12-25 DIAGNOSIS — R51 Headache: Secondary | ICD-10-CM | POA: Diagnosis not present

## 2016-12-25 DIAGNOSIS — M545 Low back pain, unspecified: Secondary | ICD-10-CM

## 2016-12-25 LAB — CBC WITH DIFFERENTIAL/PLATELET
Basophils Absolute: 0 10*3/uL (ref 0.0–0.1)
Basophils Relative: 0 %
Eosinophils Absolute: 0.2 10*3/uL (ref 0.0–0.7)
Eosinophils Relative: 3 %
HEMATOCRIT: 42.8 % (ref 39.0–52.0)
HEMOGLOBIN: 14.5 g/dL (ref 13.0–17.0)
LYMPHS ABS: 2.1 10*3/uL (ref 0.7–4.0)
Lymphocytes Relative: 37 %
MCH: 27.1 pg (ref 26.0–34.0)
MCHC: 33.9 g/dL (ref 30.0–36.0)
MCV: 79.9 fL (ref 78.0–100.0)
MONOS PCT: 10 %
Monocytes Absolute: 0.6 10*3/uL (ref 0.1–1.0)
NEUTROS ABS: 2.9 10*3/uL (ref 1.7–7.7)
NEUTROS PCT: 50 %
Platelets: 203 10*3/uL (ref 150–400)
RBC: 5.36 MIL/uL (ref 4.22–5.81)
RDW: 14 % (ref 11.5–15.5)
WBC: 5.8 10*3/uL (ref 4.0–10.5)

## 2016-12-25 LAB — LIPASE, BLOOD: LIPASE: 27 U/L (ref 11–51)

## 2016-12-25 LAB — COMPREHENSIVE METABOLIC PANEL
ALT: 6 U/L — AB (ref 17–63)
AST: 18 U/L (ref 15–41)
Albumin: 3.9 g/dL (ref 3.5–5.0)
Alkaline Phosphatase: 34 U/L — ABNORMAL LOW (ref 38–126)
Anion gap: 8 (ref 5–15)
BUN: 13 mg/dL (ref 6–20)
CHLORIDE: 101 mmol/L (ref 101–111)
CO2: 31 mmol/L (ref 22–32)
CREATININE: 1.17 mg/dL (ref 0.61–1.24)
Calcium: 9.4 mg/dL (ref 8.9–10.3)
Glucose, Bld: 98 mg/dL (ref 65–99)
Potassium: 3.9 mmol/L (ref 3.5–5.1)
Sodium: 140 mmol/L (ref 135–145)
Total Bilirubin: 0.5 mg/dL (ref 0.3–1.2)
Total Protein: 6.7 g/dL (ref 6.5–8.1)

## 2016-12-25 LAB — URINALYSIS, ROUTINE W REFLEX MICROSCOPIC
Bilirubin Urine: NEGATIVE
Glucose, UA: NEGATIVE mg/dL
HGB URINE DIPSTICK: NEGATIVE
Ketones, ur: NEGATIVE mg/dL
Leukocytes, UA: NEGATIVE
Nitrite: NEGATIVE
PH: 6.5 (ref 5.0–8.0)
Protein, ur: NEGATIVE mg/dL
SPECIFIC GRAVITY, URINE: 1.016 (ref 1.005–1.030)

## 2016-12-25 MED ORDER — HYDROMORPHONE HCL 1 MG/ML IJ SOLN
1.0000 mg | Freq: Once | INTRAMUSCULAR | Status: AC
  Administered 2016-12-25: 1 mg via INTRAVENOUS
  Filled 2016-12-25: qty 1

## 2016-12-25 MED ORDER — ONDANSETRON HCL 4 MG/2ML IJ SOLN
4.0000 mg | Freq: Once | INTRAMUSCULAR | Status: AC
  Administered 2016-12-25: 4 mg via INTRAVENOUS
  Filled 2016-12-25: qty 2

## 2016-12-25 MED ORDER — NAPROXEN 500 MG PO TABS: 500.0000 mg | ORAL_TABLET | Freq: Two times a day (BID) | ORAL | 0 refills | Status: DC

## 2016-12-25 MED ORDER — SODIUM CHLORIDE 0.9 % IV BOLUS (SEPSIS)
1000.0000 mL | Freq: Once | INTRAVENOUS | Status: AC
Start: 2016-12-25 — End: 2016-12-25
  Administered 2016-12-25: 1000 mL via INTRAVENOUS

## 2016-12-25 MED ORDER — SODIUM CHLORIDE 0.9 % IV SOLN: INTRAVENOUS | Status: DC

## 2016-12-25 MED ORDER — HYDROCODONE-ACETAMINOPHEN 5-325 MG PO TABS: 1.0000 | ORAL_TABLET | Freq: Four times a day (QID) | ORAL | 0 refills | Status: DC | PRN

## 2016-12-25 MED ORDER — CYCLOBENZAPRINE HCL 10 MG PO TABS: 10.0000 mg | ORAL_TABLET | Freq: Three times a day (TID) | ORAL | 0 refills | Status: DC

## 2016-12-25 MED ORDER — IOPAMIDOL (ISOVUE-300) INJECTION 61%
100.0000 mL | Freq: Once | INTRAVENOUS | Status: AC | PRN
  Administered 2016-12-25: 100 mL via INTRAVENOUS

## 2016-12-25 NOTE — ED Triage Notes (Signed)
Left lower back pain x 1-2 days.  Also reports bloody stools-hx of constipation.

## 2016-12-25 NOTE — Discharge Instructions (Signed)
Follow-up with your doctor on Monday. Take medications as directed. So discussed with your doctor about the dilated stomach. Return for any new or worse symptoms.

## 2016-12-25 NOTE — ED Provider Notes (Signed)
[facility] Provider Note   CSN: [id number] Arrival date & time: 12/25/16  1724    By signing my name below, I, Ruben Silva, attest that this documentation has been prepared under the direction and in the presence of Vanetta Mulders, MD. Electronically Signed: Valentino Silva, ED Scribe. 12/25/16. 7:51 PM.  History   Chief Complaint Chief Complaint  Patient presents with  . Blood In Stools   The history is provided by the patient and the spouse. No language interpreter was used.   HPI Comments: Ruben Silva is a 46 y.o. male with PMHx of HTN, Bell's palsy, Parkinson's, constipation, who presents to the Emergency Department complaining of 10/10, gradually worsening, constant, sharp left-sided lower back pain onset last night. Pt denies any recent trauma or injury. He notes his back pain is exacerbated with movement, ambulation and speaking. Per pt's spouse, she notes pt used the restroom last night and had "a lot of bloody stool that covered the entire toilet bowl". Pt reports having a h/o of chronic constipation and is currently on prescribed Linzess by Dr. Threasa Beards. He notes his normal baseline back pain is typically right-sided. Pt also complains of headache accompanied by dizziness. No alleviating factors noted. Pt denies taking any OTC medication at home for pain. He also denies fever, visual changes, runny nose, sore throat, chest pain, SOB, nausea, vomiting, diarrhea, blood in urine, dysuria, leg swelling, rash. Pt denies h/o of blood thinners.   Past Medical History:  Diagnosis Date  . Bell's palsy   . Constipation   . Hypertension   . Murmur   . Parkinsons (HCC)   . Weakness generalized     Patient Active Problem List   Diagnosis Date Noted  . Orthostatic dizziness 05/12/2016  . Low back pain 05/12/2016  . Chronic constipation 12/17/2015  . Chronic insomnia 12/17/2015  . Left hip pain 12/16/2015  . Parkinson's disease (HCC) 09/11/2015  . HYPERTENSION  11/21/2008  . MURMUR 11/21/2008  . CHEST PAIN, ATYPICAL 11/21/2008    Past Surgical History:  Procedure Laterality Date  . NO PAST SURGERIES         Home Medications    Prior to Admission medications   Medication Sig Start Date End Date Taking? Authorizing Provider  carbidopa-levodopa-entacapone (STALEVO) 50-200-200 MG tablet Take 1 tablet by mouth 5 (five) times daily. At 6, 11, 16, 20 08/12/16   Levert Feinstein, MD  Citrulline 600 MG CAPS Take 1 capsule (600 mg total) by mouth 2 (two) times daily. 11/10/16   Levert Feinstein, MD  clonazePAM (KLONOPIN) 1 MG tablet Take 2 tablets (2 mg total) by mouth at bedtime. 08/12/16   Levert Feinstein, MD  cyclobenzaprine (FLEXERIL) 10 MG tablet Take 1 tablet (10 mg total) by mouth 3 (three) times daily. 12/25/16   Vanetta Mulders, MD  docusate sodium (COLACE) 250 MG capsule Take 1 capsule (250 mg total) by mouth daily. 11/10/16   Levert Feinstein, MD  HYDROcodone-acetaminophen (NORCO/VICODIN) 5-325 MG tablet Take 1-2 tablets by mouth every 6 (six) hours as needed for moderate pain. 12/25/16   Vanetta Mulders, MD  linaclotide Karlene Einstein) 290 MCG CAPS capsule Take 1 capsule (290 mcg total) by mouth daily before breakfast. 08/12/16   Levert Feinstein, MD  naproxen (NAPROSYN) 500 MG tablet Take 1 tablet (500 mg total) by mouth 2 (two) times daily. 12/25/16   Vanetta Mulders, MD  propranolol (INDERAL) 80 MG tablet Take 1 tablet (80 mg total) by mouth 2 (two) times daily. 11/10/16   Yijun  Terrace Arabia, MD  rizatriptan (MAXALT-MLT) 5 MG disintegrating tablet Take 1 tablet (5 mg total) by mouth as needed. May repeat in 2 hours if needed 11/10/16   Levert Feinstein, MD  valsartan-hydrochlorothiazide (DIOVAN-HCT) 160-12.5 MG per tablet  01/30/15   Historical Provider, MD    Family History Family History  Problem Relation Age of Onset  . Hypertension Mother   . Liver disease Mother   . Hypercholesterolemia Mother   . Stroke Father   . Hypertension Father   . Diabetes Father     Social History Social  History  Substance Use Topics  . Smoking status: Never Smoker  . Smokeless tobacco: Never Used  . Alcohol use No     Allergies   Patient has no known allergies.   Review of Systems Review of Systems  Constitutional: Negative for fever.  HENT: Negative for rhinorrhea and sore throat.   Eyes: Negative for visual disturbance.  Respiratory: Negative for shortness of breath.   Cardiovascular: Negative for chest pain and leg swelling.  Gastrointestinal: Positive for blood in stool and constipation. Negative for diarrhea, nausea and vomiting.  Genitourinary: Negative for dysuria and hematuria.  Musculoskeletal: Positive for back pain.  Skin: Negative for rash.  Neurological: Positive for dizziness and headaches.     Physical Exam Updated Vital Signs BP 122/86 (BP Location: Right Arm)   Pulse (!) 58   Temp 98.4 F (36.9 C) (Oral)   Resp 16   Ht  (1.727 m)   Wt 93.9 kg   SpO2 98%   BMI 31.47 kg/m   Physical Exam  Constitutional: He is oriented to person, place, and time. He appears well-developed and well-nourished.  HENT:  Head: Normocephalic and atraumatic.  Dry mucous membranes.   Eyes: Conjunctivae are normal. Right eye exhibits no discharge. Left eye exhibits no discharge.  Sclera is clear. Pupils are normal. Eye track is normal.   Cardiovascular: Normal rate, regular rhythm and normal heart sounds.   Pulmonary/Chest: Effort normal. No respiratory distress.  Lungs are clear.   Abdominal: Bowel sounds are normal.  Normal bowel sounds. Belly is non-tender.   Musculoskeletal:  No swelling in ankles.   Neurological: He is alert and oriented to person, place, and time. No cranial nerve deficit or sensory deficit. He exhibits normal muscle tone. Coordination normal.  Skin: Skin is warm and dry. No rash noted. He is not diaphoretic. No erythema.  Psychiatric: He has a normal mood and affect.  Nursing note and vitals reviewed.    ED Treatments / Results    DIAGNOSTIC STUDIES: Oxygen Saturation is 98% on RA, normal by my interpretation.    COORDINATION OF CARE: 7:37 PM Discussed treatment plan with pt at bedside which includes CT imaging, nausea and pain medication and pt agreed to plan.   Labs (all labs ordered are listed, but only abnormal results are displayed) Labs Reviewed  COMPREHENSIVE METABOLIC PANEL - Abnormal; Notable for the following:       Result Value   ALT 6 (*)    Alkaline Phosphatase 34 (*)    All other components within normal limits  URINALYSIS, ROUTINE W REFLEX MICROSCOPIC - Abnormal; Notable for the following:    Color, Urine AMBER (*)    All other components within normal limits  LIPASE, BLOOD  CBC WITH DIFFERENTIAL/PLATELET    EKG  EKG Interpretation None       Radiology Ct Abdomen Pelvis W Contrast  Result Date: 12/25/2016 CLINICAL DATA:  Left lower back pain  for 1-2 days. Bloody stools and history of constipation. EXAM: CT ABDOMEN AND PELVIS WITH CONTRAST TECHNIQUE: Multidetector CT imaging of the abdomen and pelvis was performed using the standard protocol following bolus administration of intravenous contrast. CONTRAST:  ISOVUE-300 IOPAMIDOL (ISOVUE-300) INJECTION 61% COMPARISON:  CT scan Jan 07, 2015 FINDINGS: Lower chest: No acute abnormality. Hepatobiliary: The liver, gallbladder, and portal vein are normal. Pancreas: Unremarkable. No pancreatic ductal dilatation or surrounding inflammatory changes. Spleen: Normal in size without focal abnormality. Adrenals/Urinary Tract: A right-sided renal cyst is identified. Lesions in the left kidney are almost certainly cysts as well with too small to characterize. No hydronephrosis, ureterectasis, ureteral stone, or bladder abnormality identified. Stomach/Bowel: The stomach is markedly distended with contrast material. Lower attenuation gastric debris is seen in the antrum extending to the pylorus. The duodenum is normal in caliber and opacified. The small  bowel is normal with no bowel obstruction. Diverticulum is associated with the ascending colon on image 62 with no evidence of diverticulitis. The colon is otherwise normal. The appendix is normal with no appendicitis. Vascular/Lymphatic: No significant vascular findings are present. No enlarged abdominal or pelvic lymph nodes. Reproductive: Prostate is unremarkable. Other: No abdominal wall hernia or abnormality. No abdominopelvic ascites. Musculoskeletal: No acute or significant osseous findings. IMPRESSION: 1. Marked gastric distention with no underlying cause identified. This could be due to gastroparesis. No cause for gastric outlet obstruction identified. Recommend clinical correlation and follow-up as warranted. Electronically Signed   By: Gerome Sam III M.D   On: 12/25/2016 21:49    Procedures Procedures (including critical care time)  Medications Ordered in ED Medications  0.9 %  sodium chloride infusion (not administered)  HYDROmorphone (DILAUDID) injection 1 mg (not administered)  ondansetron (ZOFRAN) injection 4 mg (not administered)  sodium chloride 0.9 % bolus 1,000 mL (1,000 mLs Intravenous New Bag/Given 12/25/16 2013)  HYDROmorphone (DILAUDID) injection 1 mg (1 mg Intravenous Given 12/25/16 2004)  ondansetron (ZOFRAN) injection 4 mg (4 mg Intravenous Given 12/25/16 2004)  iopamidol (ISOVUE-300) 61 % injection 100 mL (100 mLs Intravenous Contrast Given 12/25/16 2048)     Initial Impression / Assessment and Plan / ED Course  I have reviewed the triage vital signs and the nursing notes.  Pertinent labs & imaging results that were available during my care of the patient were reviewed by me and considered in my medical decision making (see chart for details).     Workup for the abdominal pain left lower back pain which patient felt was inside the abdomen without any acute findings. CT scan of the abdomen negative for constipation. Only finding was a dilated stomach but no signs  of obstruction. Patient also without any vomiting or gastroparesis symptoms.  Patient does have some tenderness along the back area at the rib margin and up towards the tip of the scapula. This may be musculoskeletal back pain.  Patient will be treated with a short course of hydrocodone a course of Naprosyn and Flexeril.  Patient's nontoxic no acute distress.  Final Clinical Impressions(s) / ED Diagnoses   Final diagnoses:  Left lower quadrant pain  Acute left-sided low back pain without sciatica    New Prescriptions New Prescriptions   CYCLOBENZAPRINE (FLEXERIL) 10 MG TABLET    Take 1 tablet (10 mg total) by mouth 3 (three) times daily.   HYDROCODONE-ACETAMINOPHEN (NORCO/VICODIN) 5-325 MG TABLET    Take 1-2 tablets by mouth every 6 (six) hours as needed for moderate pain.   NAPROXEN (NAPROSYN) 500 MG TABLET  Take 1 tablet (500 mg total) by mouth 2 (two) times daily.    I personally performed the services described in this documentation, which was scribed in my presence. The recorded information has been reviewed and is accurate.       Vanetta Mulders, MD 12/25/16 2225

## 2016-12-25 NOTE — ED Notes (Signed)
ED Provider at bedside. 

## 2017-01-26 ENCOUNTER — Telehealth: Payer: Self-pay | Admitting: Neurology

## 2017-01-26 NOTE — Telephone Encounter (Signed)
He is not signed up for mychart and does not care to to this right now. He is going to bring his paperwork in on Friday, June 1st.  He is aware of our hours.

## 2017-01-26 NOTE — Telephone Encounter (Signed)
Pt said he needs FMLA filled out but he has no way to fax or drop it off. He is wanting to email it.  He said FMLA from last year has ran out.

## 2017-02-11 ENCOUNTER — Ambulatory Visit (INDEPENDENT_AMBULATORY_CARE_PROVIDER_SITE_OTHER): Payer: 59 | Admitting: Neurology

## 2017-02-11 ENCOUNTER — Encounter: Payer: Self-pay | Admitting: Neurology

## 2017-02-11 VITALS — BP 120/83 | HR 49 | Ht 68.5 in | Wt 201.0 lb

## 2017-02-11 DIAGNOSIS — G20 Parkinson's disease: Secondary | ICD-10-CM | POA: Diagnosis not present

## 2017-02-11 MED ORDER — LACTULOSE 10 G PO PACK: 10.0000 g | PACK | Freq: Three times a day (TID) | ORAL | 6 refills | Status: DC

## 2017-02-11 MED ORDER — PYRIDOSTIGMINE BROMIDE 60 MG PO TABS: 60.0000 mg | ORAL_TABLET | Freq: Three times a day (TID) | ORAL | 6 refills | Status: DC

## 2017-02-11 MED ORDER — SUVOREXANT 10 MG PO TABS: 10.0000 mg | ORAL_TABLET | Freq: Every day | ORAL | 5 refills | Status: DC

## 2017-02-11 MED ORDER — QUETIAPINE FUMARATE 25 MG PO TABS: 25.0000 mg | ORAL_TABLET | Freq: Every day | ORAL | 3 refills | Status: DC

## 2017-02-11 MED ORDER — CLONAZEPAM 1 MG PO TABS: 2.0000 mg | ORAL_TABLET | Freq: Every day | ORAL | 5 refills | Status: DC

## 2017-02-11 MED ORDER — CARBIDOPA-LEVODOPA-ENTACAPONE 50-200-200 MG PO TABS: 1.0000 | ORAL_TABLET | Freq: Every day | ORAL | 11 refills | Status: DC

## 2017-02-11 NOTE — Progress Notes (Signed)
Chief Complaint  Patient presents with  . Rm 4    Wife, Ruben Silva  . Follow-up    3 mo f/u  . Parkinson's disease    Continues to have some GI troubles. Went to ER earlier today. Has upcoming endocopy in July..  . Insomnia    C/o not sleeping for 3-4 days at a time despite use of Klonopin 2 tabs at bedtime. Wife reports that he uses Seroquel only occasionally.       PATIENT: Ruben Silva DOB: 12/26/1970  Chief Complaint  Patient presents with  . Rm 4    Wife, Ruben Silva  . Follow-up    3 mo f/u  . Parkinson's disease    Continues to have some GI troubles. Went to ER earlier today. Has upcoming endocopy in July..  . Insomnia    C/o not sleeping for 3-4 days at a time despite use of Klonopin 2 tabs at bedtime. Wife reports that he uses Seroquel only occasionally.    HISTORICAL  Ruben Silva 46 yo right-handed male, with early-onset idiopathic Parkinson's disease, his primary care physician Dr. Iona Beard, I saw him initially in June 2016  Since October 2015,he was noticed by the family to move at the slower pace, shuffling his gait,gradually getting worse over the past few months, he was also noticed to have right hand shaking, slow to response, decreased facial expression, to the point of needing help to pull up his pants sometimes, he also complains of orthostatic dizziness, difficulty sleeping at nighttime, fatigue, chronic constipation, impotence. 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, he has mild difficulty writing, complains of dizziness, especially with sudden positional change  MRI brain in June 2016 was normal, MRI of cervical spine, mild degenerative disc disease, no significant canal or foraminal stenosis CAT scan of the chest was normal  Laboratory in 2016 showed normal or Negative TSH, ESR, C-reactive protein RPR HIV, B12,CMP CBC, protein electrophoresis, more extensive  laboratory evaluations, negative or normal, paraneoplastic panel, ceruloplasmin, copper level, vitamin B1, B12, vitamin D, CA 992, RPR, HIV, folic acid, C-reactive protein, CPK, ESR, protein electrophoresis, Lyme titer, CBC, CMP  He was seen by [facility] movement specialist Dr. Linus Mako in September 2016, concurred with the diagnosis of early-onset idiopathic Parkinson's disease  He started sinemet since June 2016, gradually titrating dose, which did help his symptoms, he notice wearing off despite quick titrating dose of Sinemet 25/100 mg, 2 tablets 4 times a day, wearing off dyskinesia, he was started on Stalevo 200 mg since February 27 2016 at 6am, 10am, 4pm, 7pm, He goes to bed at 10pm.  But could not tolerate dopamine agonist or Azilect, Azilect-upset stomach, worsening dizziness, Requip-significant GI side effect, dizziness,  He walks regularly 2-4 miles each day.  he was taken to the emergency room in October 2016 after fall, dizziness, repeat CAT scan of the brain in October 2016 was normal  He now complains of constant left shoulder pain  UPDATE May 12 2016: Parkinson's disease: he was started on Stalevo 200 mg since February 27 2016 at 6am, 10am, 4pm, 7pm, He goes to bed at 10pm. He is very happy about current medications, denies significant bradykinesia, able to keep up with his working schedule, complains of orange color discoloration of the urine  Chronic Constipation: Once or twice each week, he took Dulcolax on the weekends,  Insomnia:  He  takes clonazepam 1 mg every night works well   Orthostatic dizziness: Worsening with [place] agonist, and Azilect, improved after he stopped medications,  Update June 26 2016: He came here as outlined scheduled urgent visit, woke up in the morning noticed right lateral leg pain, increased gait difficulty because of the pain, continue have chronic constipation despite Linzess '145mg'$  daily treatment  UPDATE Aug 12 2016: He noticed wearing off of  his levodopa before the next dose, he is taking Stalevo 50/200/200 at 6 AM,11am, 4pm, 8pm, "hit a brick wall around 11am every morning".   UPDATE November 10 2016: He complains of worsening slow movement, is now taking Stalevo 50-200-200 5 times a day, he denies significant side effect from medications,  He complains worsening constipation despite Linzess 290 mcg daily,  He also complains of significant headaches, has been taking Excedrin Migraine once a day for 2 weeks,  he also complains of orthostatic dizziness, lightheaded when getting up quickly, there was one passing out episodes when he first had up from prolonged sitting.  He sleeps well with clonazepam 2 mg every night.   UPDATE February 11 2017: Difficulty sleeping: He has difficulty sleeping, sometimes 3-4 night in a roll, seroqueal 25 mg the prescription was given in July 2017, if he take 25 mg he can sleep better, but has next morning hang over, he is also taking   clonazepam 1 mg 2 tablets at that time  Constipation, even with Linzess '290mg'$  daily  I am able to review CT abdomen scan on December 25 2016 marked gastric distention with no underlying cause identified, this could be due to gastroparesis,  I was able to review repeat CT abdomen on next a December 26 2016 from [facility], there was food filled distended stomach and proximal duodenum, EGD is pending on March 02 2017.  His tremor is more prominent, mild peak dose dyskinesia  He is now taking Stalevo 200 at 6, 10, 15, 18, 20, most difficult time is late morning,  REVIEW OF SYSTEMS: Full 14 system review of systems performed and notable only for low back pain, blurred vision headaches  ALLERGIES: No Known Allergies  HOME MEDICATIONS: Current Outpatient Prescriptions  Medication Sig Dispense Refill  . docusate sodium (COLACE) 100 MG capsule   3  . ibuprofen (ADVIL,MOTRIN) 200 MG tablet Take 200 mg by mouth every 6 (six) hours as needed.    . polyethylene glycol powder  (GLYCOLAX/MIRALAX) powder     . valsartan-hydrochlorothiazide (DIOVAN-HCT) 160-12.5 MG per tablet        PAST MEDICAL HISTORY: Past Medical History  Diagnosis Date  . Murmur   . Hypertension   . Bell's palsy   . Constipation   . Weakness generalized     PAST SURGICAL HISTORY: Past Surgical History:  Procedure Laterality Date  . NO PAST SURGERIES      FAMILY HISTORY: Family History  Problem Relation Age of Onset  . Hypertension Mother   . Liver disease Mother   . Hypercholesterolemia Mother   . Stroke Father   . Hypertension Father   . Diabetes Father     SOCIAL HISTORY:  History   Social History  . Marital Status: Married    Spouse Name: N/A  . Number of Children: 1  . Years of Education: College   Occupational History  . Government social research officer    Social History Main Topics  . Smoking status: Never Smoker   . Smokeless tobacco: Not on file  . Alcohol Use:  No  . Drug Use: No  . Sexual Activity: Not on file   Other Topics Concern  . Not on file   Social History Narrative   Lives at home with wife and son.   Right-hand.   Occasional use of caffeine.     PHYSICAL EXAM   Vitals:   02/11/17 1511  BP: 120/83  Pulse: (!) 49  Weight: 201 lb (91.2 kg)  Height: 5' 8.5" (1.74 m)    Not recorded      Body mass index is 30.12 kg/m.  PHYSICAL EXAMNIATION: This exam is performed 4 hours after last dose of Stalevo 200  Gen: NAD, conversant, well nourised, obese, well groomed                     Cardiovascular: Regular rate rhythm, no peripheral edema, warm, nontender. Eyes: Conjunctivae clear without exudates or hemorrhage Neck: Supple, no carotid bruise. Pulmonary: Clear to auscultation bilaterally   NEUROLOGICAL EXAM:  MENTAL STATUS: decreased facial expression, mild masked face, Speech:    Speech is normal; fluent and spontaneous with normal comprehension.  Cognition:    The patient is oriented to person, place, and time;     recent and remote  memory intact;     language fluent;     normal attention, concentration,     fund of knowledge.  CRANIAL NERVES: CN II: Visual fields are full to confrontation. Fundoscopic exam is normal with sharp discs and no vascular changes. Pupil equal round reactive to light CN III, IV, VI: extraocular movement are normal. No ptosis. CN V: Facial sensation is intact to pinprick in all 3 divisions bilaterally. Corneal responses are intact.  CN VII: Face is symmetric with normal eye closure and smile. CN VIII: Hearing is normal to rubbing fingers CN IX, X: Palate elevates symmetrically. Phonation is normal. CN XI: Head turning and shoulder shrug are intact CN XII: Tongue is midline with normal movements and no atrophy.  MOTOR: He has mild peak dose dyskinesia, examination was done 2 hours after last dose He has mild to moderate bilateral upper and lower extremity, nuchal rigidity, right slightly worse than the left, mild bradykinesia. Tenderness of right lateral leg on deep palpitation  REFLEXES: Reflexes are 2+ and symmetric at the biceps, triceps, knees, and ankles. Plantar responses are flexor.  SENSORY: Light touch, pinprick, position sense, and vibration sense are intact in fingers and toes.  COORDINATION: Rapid alternating movements and fine finger movements are intact. There is no dysmetria on finger-to-nose and heel-knee-shin.   GAIT/STANCE: He has decreased bilateral arm swing, right worse than left, decreased stride, mild enblock turning.   DIAGNOSTIC DATA (LABS, IMAGING, TESTING) - I reviewed patient records, labs, notes, testing and imaging myself where available.  ASSESSMENT AND PLAN  TEJAS SEAWOOD is a 46 y.o. male    Early onset idiopathic Parkinson's disease  increase Stalevo to 50/200/200 to 5-6 times a day every 3 hours.  Could not tolerate azilect, dopamine agonist Requip, due to GI side effect, increased orthostatic dizziness   Refer to [facility] movement specialist  Dr. Linus Mako    Orthostatic dizziness  Encouraging moderate exercise, increase water intake  Chronic Constipation:  Continue Linzess to 290 mcg daily with Metamucil and Colace every day   Lactulose as needed.  Gastroparesis:  He shows signs of gastroparesis, chronic constipation, significant autonomic failures  I will add on Mestinon 60 mg half to 1 tablet daily  Chronic insomnia  Add on Belsomra '10mg'$   every night with clonazepam 41mlligrams 2 tabs every night    YMarcial Pacas M.D. Ph.D.  [facility] [address] [place] [address]Ph: [phone]: [phone]

## 2017-02-18 ENCOUNTER — Telehealth: Payer: Self-pay | Admitting: Neurology

## 2017-02-18 NOTE — Telephone Encounter (Signed)
Patient called office in reference to see if he can have a referral for home health services.  Patient will not longer have the help he had before.  Patient requesting someone to come to the home 1-2 times a week if that.  [facility] to be sure he has coverage for services.  Please call

## 2017-02-18 NOTE — Telephone Encounter (Signed)
Spoke to Ruben Silva - states he is going through a separation with his wife and he may need some help at home.  He is going to call us back once he determines what his insurance will cover.

## 2017-02-24 HISTORY — PX: OTHER SURGICAL HISTORY: SHX169

## 2017-02-24 HISTORY — PX: COLONOSCOPY: SHX174

## 2017-04-05 ENCOUNTER — Telehealth: Payer: Self-pay | Admitting: Neurology

## 2017-04-05 NOTE — Telephone Encounter (Signed)
Pt calling for a refill of QUEtiapine (SEROQUEL) 25 MG tablet, pt said he has been taking 2 at bedtime and not 1.  He is out and would like refill called into  [id number] [place],  - [address] [facility] [address] [phone] (Phone) [phone] (Fax)

## 2017-04-05 NOTE — Telephone Encounter (Signed)
Spoke to pt and he was not sure what meds he was taking , so I called and LMVM for Sheralyn Boatmanoni his wife to call me.

## 2017-04-06 NOTE — Telephone Encounter (Signed)
Patients wife Ruben Silva called office returning RN's call.  Please call

## 2017-04-06 NOTE — Telephone Encounter (Signed)
Spoke with wife.  She states that pt takes clonazepam 2mg  po qhs for sleep, and has the seroquel as back up.  He ran out of the clonazepam and was taking seroquel 50mg  po qhs.  I called and spoke to [facility]uel Boomaniel [facility] [address]  There is a prescription there with the 5 refills.  I told them to fill this for pt.

## 2017-05-17 ENCOUNTER — Telehealth: Payer: Self-pay | Admitting: Neurology

## 2017-05-17 NOTE — Telephone Encounter (Signed)
Patient called office needing to reschedule appointment with [name] on 05/20/17.  Next available isn't until December/Janurary and patient would like to know if we are able to work him in sooner for an afternoon appointment.  Please call

## 2017-05-17 NOTE — Telephone Encounter (Signed)
Patient has been scheduled for 07/06/17 @ 2:30pm with [name].

## 2017-05-20 ENCOUNTER — Ambulatory Visit: Payer: 59 | Admitting: Neurology

## 2017-06-17 ENCOUNTER — Telehealth: Payer: Self-pay | Admitting: *Deleted

## 2017-06-17 NOTE — Telephone Encounter (Signed)
Pt FMLA form @ front desk for pick u

## 2017-07-01 ENCOUNTER — Telehealth: Payer: Self-pay | Admitting: *Deleted

## 2017-07-01 DIAGNOSIS — F329 Major depressive disorder, single episode, unspecified: Secondary | ICD-10-CM

## 2017-07-01 DIAGNOSIS — G20A1 Parkinson's disease without dyskinesia, without mention of fluctuations: Secondary | ICD-10-CM

## 2017-07-01 DIAGNOSIS — F32A Depression, unspecified: Secondary | ICD-10-CM

## 2017-07-01 DIAGNOSIS — G20 Parkinson's disease: Secondary | ICD-10-CM

## 2017-07-01 DIAGNOSIS — R4589 Other symptoms and signs involving emotional state: Secondary | ICD-10-CM

## 2017-07-01 NOTE — Telephone Encounter (Addendum)
Received a call from Nickolas MadridKaren Downs [facility].  The patient had an appt for voice rehab on 06/30/17 in her office.  While there, he told her he was going through a difficulty time at home.  He indicated that he had thoughts of harming himself and that he had not formulated a plan but had been thinking about it.  He did indicate to her that he was interested in counseling to get help with these feelings.  He has a pending appt with Dr. Terrace ArabiaYan on 07/06/17 but she would like to expedite a psychiatric referral.  I called the patient but was unable to get in touch with him.  I left a detailed message (ok per DPR) letting him know we had spoken with Nickolas MadridKaren Downs and to expect a call for scheduling an appt for counseling.  I also let him know to to proceed to ED if he feels that getting help is an emergency.  I provided our number to call back with any questions.

## 2017-07-01 NOTE — Telephone Encounter (Signed)
Called and spoke to patient . Patient is scheduled 07/08/2017 arrive at 2:30 for 3:00 apt. [place] with Dr. Barbee Coughreina , [place] .  [address] [place] [phone].  Patient wanted me to tell when his apt when he came to see Dr. Terrace ArabiaYan .

## 2017-07-01 NOTE — Telephone Encounter (Signed)
Spoke to Ruben Silva today - states he has been feeling depressed and had was having an exceptionally bad day when he was seeing Ruben Silva.  He has been speaking to his mother daily and this has been helpful in working through things.  Says he does not feel harmful to himself but understands to proceed to the ED if his depression becomes any worse.  He verbalized appreciation for the call and will keep his follow up appt with Dr. Terrace ArabiaYan on 07/06/17.

## 2017-07-02 ENCOUNTER — Telehealth: Payer: Self-pay | Admitting: Neurology

## 2017-07-02 NOTE — Telephone Encounter (Signed)
Called to check on patient he was fine . He was at a class . Patient will go to ER or call 911 if he needs to.

## 2017-07-06 ENCOUNTER — Encounter: Payer: Self-pay | Admitting: Neurology

## 2017-07-06 ENCOUNTER — Ambulatory Visit: Payer: 59 | Admitting: Neurology

## 2017-07-06 VITALS — BP 115/79 | HR 59 | Ht 68.5 in | Wt 195.0 lb

## 2017-07-06 DIAGNOSIS — F5104 Psychophysiologic insomnia: Secondary | ICD-10-CM | POA: Diagnosis not present

## 2017-07-06 DIAGNOSIS — K5909 Other constipation: Secondary | ICD-10-CM

## 2017-07-06 DIAGNOSIS — G20 Parkinson's disease: Secondary | ICD-10-CM

## 2017-07-06 DIAGNOSIS — K3184 Gastroparesis: Secondary | ICD-10-CM | POA: Insufficient documentation

## 2017-07-06 DIAGNOSIS — R42 Dizziness and giddiness: Secondary | ICD-10-CM

## 2017-07-06 MED ORDER — LINACLOTIDE 290 MCG PO CAPS: 290.0000 ug | ORAL_CAPSULE | Freq: Every day | ORAL | 11 refills | Status: DC

## 2017-07-06 MED ORDER — LACTULOSE 10 G PO PACK: 10.0000 g | PACK | Freq: Three times a day (TID) | ORAL | 11 refills | Status: DC

## 2017-07-06 MED ORDER — CLONAZEPAM 1 MG PO TABS: 2.0000 mg | ORAL_TABLET | Freq: Every day | ORAL | 5 refills | Status: DC

## 2017-07-06 NOTE — Progress Notes (Deleted)
Psychiatric Initial Adult Assessment   Patient Identification: Ruben Silva Luce MRN:  [id number] Date of Evaluation:  07/06/2017 Referral Source: Dr. Levert FeinsteinYan Yijun Chief Complaint:   Visit Diagnosis: No diagnosis found.  History of Present Illness:   Ruben Silva Aispuro is a 46 year old male with depression, early-onset idiopathic Parkinson's disease, who is referred for depression.  Per chart review, [facility] and is planning to get in apomorphine study.   Associated Signs/Symptoms: Depression Symptoms:  {DEPRESSION SYMPTOMS:20000} (Hypo) Manic Symptoms:  {BHH MANIC SYMPTOMS:22872} Anxiety Symptoms:  {BHH ANXIETY SYMPTOMS:22873} Psychotic Symptoms:  {BHH PSYCHOTIC SYMPTOMS:22874} PTSD Symptoms: {BHH PTSD SYMPTOMS:22875}  Past Psychiatric History:  Outpatient:  Psychiatry admission:  Previous suicide attempt:  Past trials of medication:  History of violence:   Previous Psychotropic Medications: {YES/NO:21197}  Substance Abuse History in the last 12 months:  {yes no:314532}  Consequences of Substance Abuse: {BHH CONSEQUENCES OF SUBSTANCE ABUSE:22880}  Past Medical History:  Past Medical History:  Diagnosis Date  . Bell's palsy   . Constipation   . Hypertension   . Murmur   . Parkinsons (HCC)   . Weakness generalized     Past Surgical History:  Procedure Laterality Date  . NO PAST SURGERIES      Family Psychiatric History: ***  Family History:  Family History  Problem Relation Age of Onset  . Hypertension Mother   . Liver disease Mother   . Hypercholesterolemia Mother   . Stroke Father   . Hypertension Father   . Diabetes Father     Social History:   Social History   Socioeconomic History  . Marital status: Married    Spouse name: Not on file  . Number of children: 1  . Years of education: College  . Highest education level: Not on file  Social Needs  . Financial resource strain: Not on file  . Food insecurity - worry: Not on  file  . Food insecurity - inability: Not on file  . Transportation needs - medical: Not on file  . Transportation needs - non-medical: Not on file  Occupational History  . Occupation: Emergency planning/management officerroject Manager  Tobacco Use  . Smoking status: Never Smoker  . Smokeless tobacco: Never Used  Substance and Sexual Activity  . Alcohol use: No  . Drug use: No  . Sexual activity: Not on file  Other Topics Concern  . Not on file  Social History Narrative   Lives at home with wife and son.   Right-hand.   Occasional use of caffeine.    Additional Social History: ***  Allergies:  No Known Allergies  Metabolic Disorder Labs: No results found for: HGBA1C, MPG No results found for: PROLACTIN No results found for: CHOL, TRIG, HDL, CHOLHDL, VLDL, LDLCALC   Current Medications: Current Outpatient Medications  Medication Sig Dispense Refill  . carbidopa-levodopa-entacapone (STALEVO) 50-200-200 MG tablet Take 1 tablet by mouth 6 (six) times daily. Every 3 hours 180 tablet 11  . clonazePAM (KLONOPIN) 1 MG tablet Take 2 tablets (2 mg total) at bedtime by mouth. Do not refill in less than 30 days 60 tablet 5  . lactulose (CEPHULAC) 10 g packet Take 1 packet (10 g total) 3 (three) times daily by mouth. 30 each 11  . linaclotide (LINZESS) 290 MCG CAPS capsule Take 1 capsule (290 mcg total) daily before breakfast by mouth. 30 capsule 11  . propranolol (INDERAL) 80 MG tablet Take 1 tablet (80 mg total) by mouth 2 (two) times daily. 60  tablet 11  . pyridostigmine (MESTINON) 60 MG tablet Take 1 tablet (60 mg total) by mouth 3 (three) times daily. 90 tablet 6   No current facility-administered medications for this visit.     Neurologic: Headache: No Seizure: No Paresthesias:No  Musculoskeletal: Strength & Muscle Tone: within normal limits Gait & Station: normal Patient leans: N/A  Psychiatric Specialty Exam: ROS  There were no vitals taken for this visit.There is no height or weight on file to  calculate BMI.  General Appearance: Fairly Groomed  Eye Contact:  Good  Speech:  Clear and Coherent  Volume:  Normal  Mood:  {BHH MOOD:22306}  Affect:  {Affect (PAA):22687}  Thought Process:  Coherent and Goal Directed  Orientation:  Full (Time, Place, and Person)  Thought Content:  Logical  Suicidal Thoughts:  {ST/HT (PAA):22692}  Homicidal Thoughts:  {ST/HT (PAA):22692}  Memory:  Immediate;   Good Recent;   Good Remote;   Good  Judgement:  {Judgement (PAA):22694}  Insight:  {Insight (PAA):22695}  Psychomotor Activity:  Normal  Concentration:  Concentration: Good and Attention Span: Good  Recall:  Good  Fund of Knowledge:Good  Language: Good  Akathisia:  No  Handed:  Right  AIMS (if indicated):  N/A  Assets:  Communication Skills Desire for Improvement  ADL's:  Intact  Cognition: WNL  Sleep:  ***   Assessment  Plan  The patient demonstrates the following risk factors for suicide: Chronic risk factors for suicide include: {Chronic Risk Factors for RUEAVWU:98119147}Suicide:30414011}. Acute risk factors for suicide include: {Acute Risk Factors for WGNFAOZ:30865784}Suicide:30414012}. Protective factors for this patient include: {Protective Factors for Suicide ONGE:95284132}Risk:30414013}. Considering these factors, the overall suicide risk at this point appears to be {Desc; low/moderate/high:110033}. Patient {ACTION; IS/IS GMW:10272536}OT:21021397} appropriate for outpatient follow up.   Treatment Plan Summary: Plan as above   Neysa Hottereina Ailany Koren, MD 11/6/20183:46 PM

## 2017-07-06 NOTE — Progress Notes (Signed)
Chief Complaint  Patient presents with  . Depression    States his depression is much worse.  He has been speaking with his mother daily and no longer has feelings of harming himself.  He has an appt with Dr. Modesta Messing, psychiatrist in [place], on 07/08/17.  . Parkinson's Disease    Says his gait is worse and is causing him to fall.  His speech has been slurred. He had a recent massage which was very helpful for his joint pain and flexibility.  He is in a research trial at [facility].       PATIENT: Bubba Camp DOB: 17-May-1971  Chief Complaint  Patient presents with  . Depression    States his depression is much worse.  He has been speaking with his mother daily and no longer has feelings of harming himself.  He has an appt with Dr. Modesta Messing, psychiatrist in [place], on 07/08/17.  . Parkinson's Disease    Says his gait is worse and is causing him to fall.  His speech has been slurred. He had a recent massage which was very helpful for his joint pain and flexibility.  He is in a research trial at [place] 46 yo right-handed male, with early-onset idiopathic Parkinson's disease, his primary care physician Dr. Iona Beard, I saw him initially in June 2016  Since October 2015,he was noticed by the family to move at the slower pace, shuffling his gait,gradually getting worse over the past few months, he was also noticed to have right hand shaking, slow to response, decreased facial expression, to the point of needing help to pull up his pants sometimes, he also complains of orthostatic dizziness, difficulty sleeping at nighttime, fatigue, chronic constipation, impotence. 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, he has mild difficulty writing, complains of dizziness, especially with sudden positional change  MRI brain in June 2016 was normal, MRI of cervical spine, mild  degenerative disc disease, no significant canal or foraminal stenosis CAT scan of the chest was normal  Laboratory in 2016 showed normal or Negative TSH, ESR, C-reactive protein RPR HIV, B12,CMP CBC, protein electrophoresis, more extensive laboratory evaluations, negative or normal, paraneoplastic panel, ceruloplasmin, copper level, vitamin B1, B12, vitamin D, CA 233, RPR, HIV, folic acid, C-reactive protein, CPK, ESR, protein electrophoresis, Lyme titer, CBC, CMP  He was seen by [facility] movement specialist Dr. Linus Mako in September 2016, concurred with the diagnosis of early-onset idiopathic Parkinson's disease  He started sinemet since June 2016, gradually titrating dose, which did help his symptoms, he notice wearing off despite quick titrating dose of Sinemet 25/100 mg, 2 tablets 4 times a day, wearing off dyskinesia, he was started on Stalevo 200 mg since February 27 2016 at 6am, 10am, 4pm, 7pm, He goes to bed at 10pm.  But could not tolerate dopamine agonist or Azilect, Azilect-upset stomach, worsening dizziness, Requip-significant GI side effect, dizziness,  He walks regularly 2-4 miles each day.  he was taken to the emergency room in October 2016 after fall, dizziness, repeat CAT scan of the brain in October 2016 was normal  He now complains of constant left shoulder pain  UPDATE May 12 2016: Parkinson's disease: he was started on Stalevo 200 mg since February 27 2016 at 6am, 10am, 4pm, 7pm, He goes to bed at 10pm. He is very happy about current medications, denies  significant bradykinesia, able to keep up with his working schedule, complains of orange color discoloration of the urine  Chronic Constipation: Once or twice each week, he took Dulcolax on the weekends,  Insomnia:  He takes clonazepam 1 mg every night works well   Orthostatic dizziness: Worsening with [place] agonist, and Azilect, improved after he stopped medications,  Update June 26 2016: He came here as outlined  scheduled urgent visit, woke up in the morning noticed right lateral leg pain, increased gait difficulty because of the pain, continue have chronic constipation despite Linzess 124m daily treatment  UPDATE Aug 12 2016: He noticed wearing off of his levodopa before the next dose, he is taking Stalevo 50/200/200 at 6 AM,11am, 4pm, 8pm, "hit a brick wall around 11am every morning".   UPDATE November 10 2016: He complains of worsening slow movement, is now taking Stalevo 50-200-200 5 times a day, he denies significant side effect from medications,  He complains worsening constipation despite Linzess 290 mcg daily,  He also complains of significant headaches, has been taking Excedrin Migraine once a day for 2 weeks,  he also complains of orthostatic dizziness, lightheaded when getting up quickly, there was one passing out episodes when he first had up from prolonged sitting.  He sleeps well with clonazepam 2 mg every night.   UPDATE February 11 2017: Difficulty sleeping: He has difficulty sleeping, sometimes 3-4 night in a roll, seroqueal 25 mg the prescription was given in July 2017, if he take 25 mg he can sleep better, but has next morning hang over, he is also taking   clonazepam 1 mg 2 tablets at that time  Constipation, even with Linzess 293mdaily  I am able to review CT abdomen scan on December 25 2016 marked gastric distention with no underlying cause identified, this could be due to gastroparesis,  I was able to review [facility] there was food filled distended stomach and proximal duodenum, EGD is pending on March 02 2017.  His tremor is more prominent, mild peak dose dyskinesia  He is now taking Stalevo 200 at 6, 10, 15, 18, 20, most difficult time is late morning,  UPDATE Jul 06 2017: He is in sublingual apomorphine clinical trial, which has helped his symptoms greatly,  He is now taking Stalevo 200 5 times a day,   He still has  chronic constipations, taking  Linzess  290 mg daily. Mestinon 6072mid, Latulose as needed.  Chronic insomnia, he is taking clonazepam 1mg68mtabs qhs.  REVIEW OF SYSTEMS: Full 14 system review of systems performed and notable only for as above.   ALLERGIES: No Known Allergies  HOME MEDICATIONS: Current Outpatient Prescriptions  Medication Sig Dispense Refill  . docusate sodium (COLACE) 100 MG capsule   3  . ibuprofen (ADVIL,MOTRIN) 200 MG tablet Take 200 mg by mouth every 6 (six) hours as needed.    . polyethylene glycol powder (GLYCOLAX/MIRALAX) powder     . valsartan-hydrochlorothiazide (DIOVAN-HCT) 160-12.5 MG per tablet        PAST MEDICAL HISTORY: Past Medical History  Diagnosis Date  . Murmur   . Hypertension   . Bell's palsy   . Constipation   . Weakness generalized     PAST SURGICAL HISTORY: Past Surgical History:  Procedure Laterality Date  . NO PAST SURGERIES      FAMILY HISTORY: Family History  Problem Relation Age of Onset  . Hypertension Mother   .  Liver disease Mother   . Hypercholesterolemia Mother   . Stroke Father   . Hypertension Father   . Diabetes Father     SOCIAL HISTORY:  History   Social History  . Marital Status: Married    Spouse Name: N/A  . Number of Children: 1  . Years of Education: College   Occupational History  . Government social research officer    Social History Main Topics  . Smoking status: Never Smoker   . Smokeless tobacco: Not on file  . Alcohol Use: No  . Drug Use: No  . Sexual Activity: Not on file   Other Topics Concern  . Not on file   Social History Narrative   Lives at home with wife and son.   Right-hand.   Occasional use of caffeine.     PHYSICAL EXAM   Vitals:   07/06/17 1434  BP: 115/79  Pulse: (!) 59  Weight: 195 lb (88.5 kg)  Height: 5' 8.5" (1.74 m)    Not recorded      Body mass index is 29.22 kg/m.  PHYSICAL EXAMNIATION: This exam is performed 4 hours after last dose of Stalevo 200    Gen: NAD, conversant, well nourised, obese, well groomed                     Cardiovascular: Regular rate rhythm, no peripheral edema, warm, nontender. Eyes: Conjunctivae clear without exudates or hemorrhage Neck: Supple, no carotid bruise. Pulmonary: Clear to auscultation bilaterally   NEUROLOGICAL EXAM:  MENTAL STATUS: decreased facial expression, mild masked face, Speech:    Speech is normal; fluent and spontaneous with normal comprehension.  Cognition:    The patient is oriented to person, place, and time;     recent and remote memory intact;     language fluent;     normal attention, concentration,     fund of knowledge.  CRANIAL NERVES: CN II: Visual fields are full to confrontation. Fundoscopic exam is normal with sharp discs and no vascular changes. Pupil equal round reactive to light CN III, IV, VI: extraocular movement are normal. No ptosis. CN V: Facial sensation is intact to pinprick in all 3 divisions bilaterally. Corneal responses are intact.  CN VII: Face is symmetric with normal eye closure and smile. CN VIII: Hearing is normal to rubbing fingers CN IX, X: Palate elevates symmetrically. Phonation is normal. CN XI: Head turning and shoulder shrug are intact CN XII: Tongue is midline with normal movements and no atrophy.  MOTOR: He has mild peak dose dyskinesia, examination was done 3 hours after last dose He has mild to moderate bilateral upper and lower extremity, nuchal rigidity, right slightly worse than the left, mild bradykinesia. Tenderness of right lateral leg on deep palpitation  REFLEXES: Reflexes are 2+ and symmetric at the biceps, triceps, knees, and ankles. Plantar responses are flexor.  SENSORY: Light touch, pinprick, position sense, and vibration sense are intact in fingers and toes.  COORDINATION: Rapid alternating movements and fine finger movements are intact. There is no dysmetria on finger-to-nose and heel-knee-shin.   GAIT/STANCE: He has  mildly decreased bilateral arm swing, decreased stride, mild enblock turning.   DIAGNOSTIC DATA (LABS, IMAGING, TESTING) - I reviewed patient records, labs, notes, testing and imaging myself where available.  ASSESSMENT AND PLAN  BRAM HOTTEL is a 46 y.o. male    Early onset idiopathic Parkinson's disease  Keep Stalevo to 50/200/200 to 5 times a day every 3 hours.  Could not  tolerate azilect, dopamine agonist Requip, due to GI side effect, increased orthostatic dizziness   Refer to [place] Dr. Linus Mako    Orthostatic dizziness  Encouraging moderate exercise, increase water intake  Chronic Constipation:  Continue Linzess to 290 mcg daily with Metamucil and Colace every day   Lactulose as needed.  Gastroparesis:  He shows signs of gastroparesis, chronic constipation, significant autonomic failures  I will add on Mestinon 60 mg half to 1 tablet daily  Chronic insomnia  Keep clonazepam 13mlligrams 2 tabs every night   Depression, anxiety:  Going to see psychiatrist.    YMarcial Pacas M.D. Ph.D.  [facility] [address] [place] [address]Ph: [phone]: [phone]

## 2017-07-08 ENCOUNTER — Ambulatory Visit (HOSPITAL_COMMUNITY): Payer: 59 | Admitting: Psychiatry

## 2017-07-17 ENCOUNTER — Other Ambulatory Visit: Payer: Self-pay | Admitting: Neurology

## 2017-07-19 ENCOUNTER — Telehealth: Payer: Self-pay | Admitting: Neurology

## 2017-07-19 NOTE — Telephone Encounter (Signed)
Letter is generated that massage therapy is necessary,

## 2017-07-19 NOTE — Telephone Encounter (Signed)
[facility].    M[facility] to:  [facility], [id number], [place], [place] [id number]

## 2017-08-02 NOTE — Telephone Encounter (Signed)
[facility] [phone] x [phone] request procedure code and dx code and clinical information. It can also be faxed [phone].

## 2017-08-02 NOTE — Telephone Encounter (Signed)
[facility].

## 2017-08-02 NOTE — Telephone Encounter (Signed)
Letter provided by Dr. Terrace ArabiaYan on 07/19/17:  To Whom It May Concern:  This letter is to verify that Mr. Ruben Silva is under my care for early-onset idiopathic Parkinson's disease.  Over the years, he had progressive body stiffness, bradykinesia, gait abnormality, and also other autonomic features.  He has diffuse body achy pain, difficulty sleeping, recent worsening depression anxiety,  He is on titrating dose of medications, but with suboptimal control of some of his symptoms, especially shoulder pain, stiffness.  In addition, he was not able to tolerate multiple medications.  He would benefit massage therapy. _____________________________________________________________________________  Diagnosis codes: 1) Body Pain - R52 2) Stiffness - M26.60 3) Bradykinesia - R25.8 4) Gait Abnormality - R26.9 ______________________________________________________________________________  His last clinic notes are included with this fax.  We do not have the procedure code due to our office not being the provider to perform the services.

## 2017-08-27 ENCOUNTER — Other Ambulatory Visit: Payer: Self-pay | Admitting: Neurology

## 2017-09-14 ENCOUNTER — Telehealth: Payer: Self-pay | Admitting: Neurology

## 2017-09-14 ENCOUNTER — Other Ambulatory Visit: Payer: Self-pay | Admitting: Neurology

## 2017-09-14 NOTE — Telephone Encounter (Signed)
Spoke to patient - he is concerned about the reported symptoms below and would like for his appt to be moved to an earlier date.  He has been added to Dr. Zannie CoveYan's scheduled on 09/16/17 at 1pm.  He will arrived at 12:45pm for his appt.

## 2017-09-14 NOTE — Telephone Encounter (Signed)
Pt is requesting a call from RN to discuss a couple things such as pt has had a cough on and off since thanksgiving, trouble breathing and pain throughout his right leg. Please contact at [phone] or at [phone]

## 2017-09-16 ENCOUNTER — Encounter: Payer: Self-pay | Admitting: *Deleted

## 2017-09-16 ENCOUNTER — Encounter (INDEPENDENT_AMBULATORY_CARE_PROVIDER_SITE_OTHER): Payer: Self-pay

## 2017-09-16 ENCOUNTER — Ambulatory Visit: Payer: 59 | Admitting: Neurology

## 2017-09-16 ENCOUNTER — Encounter: Payer: Self-pay | Admitting: Neurology

## 2017-09-16 VITALS — BP 118/76 | HR 50 | Ht 68.5 in | Wt 198.5 lb

## 2017-09-16 DIAGNOSIS — R42 Dizziness and giddiness: Secondary | ICD-10-CM | POA: Diagnosis not present

## 2017-09-16 DIAGNOSIS — F5104 Psychophysiologic insomnia: Secondary | ICD-10-CM

## 2017-09-16 DIAGNOSIS — G20 Parkinson's disease: Secondary | ICD-10-CM | POA: Diagnosis not present

## 2017-09-16 MED ORDER — CARBIDOPA-LEVODOPA-ENTACAPONE 50-200-200 MG PO TABS: 1.0000 | ORAL_TABLET | Freq: Every day | ORAL | 4 refills | Status: DC

## 2017-09-16 NOTE — Progress Notes (Signed)
Chief Complaint  Patient presents with  . parkinson's disease    Patient has been dealing with on/off cold-like symptoms.        PATIENT: Ruben Silva DOB: [birth year]  Chief Complaint  Patient presents with  . parkinson's disease    Patient has been dealing with on/off cold-like symptoms.     HISTORICAL  Ruben Silva 47 yo right-handed male, with early-onset idiopathic Parkinson's disease, his primary care physician Dr. Iona Beard, I saw him initially in June 2016  Since October 2015,he was noticed by the family to move at the slower pace, shuffling his gait,gradually getting worse over the past few months, he was also noticed to have right hand shaking, slow to response, decreased facial expression, to the point of needing help to pull up his pants sometimes, he also complains of orthostatic dizziness, difficulty sleeping at nighttime, fatigue, chronic constipation, impotence. 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, he has mild difficulty writing, complains of dizziness, especially with sudden positional change  MRI brain in June 2016 was normal, MRI of cervical spine, mild degenerative disc disease, no significant canal or foraminal stenosis CAT scan of the chest was normal  Laboratory in 2016 showed normal or Negative TSH, ESR, C-reactive protein RPR HIV, B12,CMP CBC, protein electrophoresis, more extensive laboratory evaluations, negative or normal, paraneoplastic panel, ceruloplasmin, copper level, vitamin B1, B12, vitamin D, CA 222, RPR, HIV, folic acid, C-reactive protein, CPK, ESR, protein electrophoresis, Lyme titer, CBC, CMP  He was seen by [facility] movement specialist Dr. Linus Mako in September 2016, concurred with the diagnosis of early-onset idiopathic Parkinson's disease  He started sinemet since June 2016, gradually titrating dose, which did help his symptoms, he notice  wearing off despite quick titrating dose of Sinemet 25/100 mg, 2 tablets 4 times a day, wearing off dyskinesia, he was started on Stalevo 200 mg since February 27 2016 at 6am, 10am, 4pm, 7pm, He goes to bed at 10pm.  But could not tolerate dopamine agonist or Azilect, Azilect-upset stomach, worsening dizziness, Requip-significant GI side effect, dizziness,  He walks regularly 2-4 miles each day.  he was taken to the emergency room in October 2016 after fall, dizziness, repeat CAT scan of the brain in October 2016 was normal  He now complains of constant left shoulder pain  UPDATE May 12 2016: Parkinson's disease: he was started on Stalevo 200 mg since February 27 2016 at 6am, 10am, 4pm, 7pm, He goes to bed at 10pm. He is very happy about current medications, denies significant bradykinesia, able to keep up with his working schedule, complains of orange color discoloration of the urine  Chronic Constipation: Once or twice each week, he took Dulcolax on the weekends,  Insomnia:  He takes clonazepam 1 mg every night works well   Orthostatic dizziness: Worsening with [place] agonist, and Azilect, improved after he stopped medications,  Update June 26 2016: He came here as outlined scheduled urgent visit, woke up in the morning noticed right lateral leg pain, increased gait difficulty because of the pain, continue have chronic constipation despite Linzess '145mg'$  daily treatment  UPDATE Aug 12 2016: He noticed wearing off of his levodopa before the next dose, he is taking Stalevo 50/200/200 at 6 AM,11am, 4pm, 8pm, "hit a brick wall around 11am every morning".   UPDATE November 10 2016: He complains of worsening slow movement, is now taking Stalevo  50-200-200 5 times a day, he denies significant side effect from medications,  He complains worsening constipation despite Linzess 290 mcg daily,  He also complains of significant headaches, has been taking Excedrin Migraine once a day for 2 weeks,  he  also complains of orthostatic dizziness, lightheaded when getting up quickly, there was one passing out episodes when he first had up from prolonged sitting.  He sleeps well with clonazepam 2 mg every night.   UPDATE February 11 2017: Difficulty sleeping: He has difficulty sleeping, sometimes 3-4 night in a roll, seroqueal 25 mg the prescription was given in July 2017, if he take 25 mg he can sleep better, but has next morning hang over, he is also taking   clonazepam 1 mg 2 tablets at that time  Constipation, even with Linzess '290mg'$  daily  I am able to review CT abdomen scan on December 25 2016 marked gastric distention with no underlying cause identified, this could be due to gastroparesis,  I was able to review repeat CT abdomen on next a December 26 2016 from [facility] system, there was food filled distended stomach and proximal duodenum, EGD is pending on March 02 2017.  His tremor is more prominent, mild peak dose dyskinesia  He is now taking Stalevo 200 at 6, 10, 15, 18, 20, most difficult time is late morning,  UPDATE Jul 06 2017: He is in sublingual apomorphine clinical trial, which has helped his symptoms greatly,  He is now taking Stalevo 200 5 times a day,   He still has chronic constipations, taking  Linzess  290 mg daily. Mestinon '60mg'$  tid, Latulose as needed.  Chronic insomnia, he is taking clonazepam '1mg'$  2 tabs qhs.  UPDATE Sep 16 2017: He took Stalevo at 53, 83, 1400, 1730, 2100,  He took night dose, so he can move function in the morning time.  He complains of right leg pain, no low back pain, around right knee, was seen by orthopedic in the past, no significant abnormality found.   He has chronic cough, sore throat, no fever, feel tired.  He had mold in his house, is going to have his house inspected.   REVIEW OF SYSTEMS: Full 14 system review of systems performed and notable only for as above.   ALLERGIES: No Known Allergies  HOME MEDICATIONS: Current Outpatient  Prescriptions  Medication Sig Dispense Refill  . docusate sodium (COLACE) 100 MG capsule   3  . ibuprofen (ADVIL,MOTRIN) 200 MG tablet Take 200 mg by mouth every 6 (six) hours as needed.    . polyethylene glycol powder (GLYCOLAX/MIRALAX) powder     . valsartan-hydrochlorothiazide (DIOVAN-HCT) 160-12.5 MG per tablet        PAST MEDICAL HISTORY: Past Medical History  Diagnosis Date  . Murmur   . Hypertension   . Bell's palsy   . Constipation   . Weakness generalized     PAST SURGICAL HISTORY: Past Surgical History:  Procedure Laterality Date  . NO PAST SURGERIES      FAMILY HISTORY: Family History  Problem Relation Age of Onset  . Hypertension Mother   . Liver disease Mother   . Hypercholesterolemia Mother   . Stroke Father   . Hypertension Father   . Diabetes Father     SOCIAL HISTORY:  History   Social History  . Marital Status: Married    Spouse Name: N/A  . Number of Children: 1  . Years of Education: College   Occupational History  . Government social research officer  Social History Main Topics  . Smoking status: Never Smoker   . Smokeless tobacco: Not on file  . Alcohol Use: No  . Drug Use: No  . Sexual Activity: Not on file   Other Topics Concern  . Not on file   Social History Narrative   Lives at home with wife and son.   Right-hand.   Occasional use of caffeine.     PHYSICAL EXAM   Vitals:   09/16/17 1308  BP: 118/76  Pulse: (!) 50  Weight: 198 lb 8 oz (90 kg)  Height: 5' 8.5" (1.74 m)    Not recorded      Body mass index is 29.74 kg/m.  PHYSICAL EXAMNIATION: This exam is performed 4 hours after last dose of Stalevo 200  Gen: NAD, conversant, well nourised, obese, well groomed                     Cardiovascular: Regular rate rhythm, no peripheral edema, warm, nontender. Eyes: Conjunctivae clear without exudates or hemorrhage Neck: Supple, no carotid bruise. Pulmonary: Clear to auscultation bilaterally   NEUROLOGICAL EXAM:  MENTAL  STATUS: tired looking middle aged male, mild masked face, Speech:    Speech is normal; fluent and spontaneous with normal comprehension.  Cognition:    The patient is oriented to person, place, and time;     recent and remote memory intact;     language fluent;     normal attention, concentration,     fund of knowledge.  CRANIAL NERVES: CN II: Visual fields are full to confrontation. Fundoscopic exam is normal with sharp discs and no vascular changes. Pupil equal round reactive to light CN III, IV, VI: extraocular movement are normal. No ptosis. CN V: Facial sensation is intact to pinprick in all 3 divisions bilaterally. Corneal responses are intact.  CN VII: Face is symmetric with normal eye closure and smile. CN VIII: Hearing is normal to rubbing fingers CN IX, X: Palate elevates symmetrically. Phonation is normal. CN XI: Head turning and shoulder shrug are intact CN XII: Tongue is midline with normal movements and no atrophy.  MOTOR: He has mild peak dose dyskinesia, examination was done 3 hours after last dose He has mild to moderate bilateral upper and lower extremity, nuchal rigidity, right slightly worse than the left, mild bradykinesia. Tenderness of right lateral leg on deep palpitation  REFLEXES: Reflexes are 2+ and symmetric at the biceps, triceps, knees, and ankles. Plantar responses are flexor.  SENSORY: Light touch, pinprick, position sense, and vibration sense are intact in fingers and toes.  COORDINATION: Rapid alternating movements and fine finger movements are intact. There is no dysmetria on finger-to-nose and heel-knee-shin.   GAIT/STANCE: He has mildly decreased bilateral arm swing, decreased stride, mild enblock turning. Difficulty clear his feet from floor   DIAGNOSTIC DATA (LABS, IMAGING, TESTING) - I reviewed patient records, labs, notes, testing and imaging myself where available.  ASSESSMENT AND PLAN  Ruben Silva is a 47 y.o. male    Early  onset idiopathic Parkinson's disease  Keep Stalevo to 50/200/200 to 5 times a day every 3 hours.  Could not tolerate azilect, dopamine agonist Requip, due to GI side effect, increased orthostatic dizziness   He also see [facility] movement specialist Dr. Linus Mako, in on trial for sublingual apomorphine study.  Lab evaluation today.  Orthostatic dizziness  Encouraging moderate exercise, increase water intake  Chronic Constipation:  Metamucil and Colace every day   Lactulose as needed.  Gastroparesis:  He shows signs of gastroparesis, chronic constipation, significant autonomic failures  Stable now.  Chronic insomnia  Keep clonazepam 42mlligrams 2 tabs every night      YMarcial Pacas M.D. Ph.D.  [facility] [address] [place]  [address]Ph: [phone]: [phone]

## 2017-09-21 ENCOUNTER — Other Ambulatory Visit: Payer: Self-pay | Admitting: Neurology

## 2017-09-21 ENCOUNTER — Telehealth: Payer: Self-pay | Admitting: Neurology

## 2017-09-21 MED ORDER — PYRIDOSTIGMINE BROMIDE 60 MG PO TABS: 60.0000 mg | ORAL_TABLET | Freq: Three times a day (TID) | ORAL | 11 refills | Status: DC

## 2017-09-21 NOTE — Telephone Encounter (Signed)
Pt is wanting to know why his Pyridostigmine medication was denied. Pt is wanting either something different sent in or a call back to discuss

## 2017-09-21 NOTE — Telephone Encounter (Addendum)
Spoke to patient - he is still taking Mestinon 60mg , TID.  His prescription has been sent to the pharmacy.

## 2017-10-11 ENCOUNTER — Ambulatory Visit: Payer: 59 | Admitting: Neurology

## 2017-11-15 ENCOUNTER — Other Ambulatory Visit: Payer: Self-pay | Admitting: Neurology

## 2017-11-21 ENCOUNTER — Other Ambulatory Visit: Payer: Self-pay | Admitting: Neurology

## 2017-11-22 ENCOUNTER — Telehealth: Payer: Self-pay | Admitting: Neurology

## 2017-11-22 NOTE — Telephone Encounter (Signed)
error 

## 2017-11-22 NOTE — Telephone Encounter (Signed)
Pt called wanting to know why propranolol was denied. Please call to advise.

## 2017-11-23 NOTE — Telephone Encounter (Addendum)
Dr. Terrace ArabiaYan has reviewed his chart. He was experiencing dizziness at his last visit that was suspected to be related to BP.  She discontinued propranolol at that appointment to see if his dizzy spells would improve (felt his BP may be dropping too low on the medication). He will hold the propanolol and keep his pending appointment on 12/27/17. I have asked him to take his blood pressure at home.

## 2017-11-27 ENCOUNTER — Telehealth: Payer: Self-pay | Admitting: Neurology

## 2017-11-27 MED ORDER — PROPRANOLOL HCL 40 MG PO TABS
40.0000 mg | ORAL_TABLET | Freq: Two times a day (BID) | ORAL | 0 refills | Status: DC | PRN
Start: 2017-11-27 — End: 2017-12-27

## 2017-11-27 NOTE — Telephone Encounter (Signed)
I called the patient.  The patient has been recently taken off of propranolol due to lowering of blood pressure.  The patient is concerned that his blood pressure will go too high off of all blood pressure medications.  He wants to have something available if his blood pressure does go high that he can take something for it.  He was recently taken off of propranolol 80 mg twice daily.  I will call in a prescription for a 40 mg tablet take if needed.

## 2017-12-14 ENCOUNTER — Other Ambulatory Visit: Payer: Self-pay | Admitting: Neurology

## 2017-12-14 ENCOUNTER — Telehealth: Payer: Self-pay | Admitting: Neurology

## 2017-12-14 NOTE — Telephone Encounter (Signed)
Spoke to patient - explained that our office is unable to accept FMLA paperwork without a fee of $50 being paid prior to completion.  He verbalized understanding.  He is going to speak with his HR department to see if they will allow the current paperwork to be valid for an entire year.  If not, he will bring the forms back and pay the fee.

## 2017-12-14 NOTE — Telephone Encounter (Signed)
Pt has called asking for RN Marcelino DusterMichelle to call him just as soon as she becomes available.  Pt states he was just in an accident. Pt is asking that if he can not be reached on his mobile please call him on [phone]

## 2017-12-16 ENCOUNTER — Ambulatory Visit: Payer: 59 | Admitting: Neurology

## 2017-12-27 ENCOUNTER — Ambulatory Visit: Payer: 59 | Admitting: Neurology

## 2017-12-27 ENCOUNTER — Telehealth: Payer: Self-pay | Admitting: Neurology

## 2017-12-27 ENCOUNTER — Encounter: Payer: Self-pay | Admitting: Neurology

## 2017-12-27 VITALS — BP 127/77 | HR 69 | Ht 68.5 in | Wt 200.5 lb

## 2017-12-27 DIAGNOSIS — M5416 Radiculopathy, lumbar region: Secondary | ICD-10-CM

## 2017-12-27 DIAGNOSIS — M5136 Other intervertebral disc degeneration, lumbar region: Secondary | ICD-10-CM

## 2017-12-27 DIAGNOSIS — K5909 Other constipation: Secondary | ICD-10-CM | POA: Diagnosis not present

## 2017-12-27 DIAGNOSIS — G20 Parkinson's disease: Secondary | ICD-10-CM | POA: Diagnosis not present

## 2017-12-27 MED ORDER — CLONAZEPAM 1 MG PO TABS: 2.0000 mg | ORAL_TABLET | Freq: Every day | ORAL | 5 refills | Status: DC

## 2017-12-27 NOTE — Progress Notes (Signed)
Chief Complaint  Patient presents with  . Follow-up    EMG room 4, alone. Last seen 09/16/17.  Having problems with sciatic nerve on left side. Seeing chiropractor for this.       PATIENT: Ruben Silva DOB: 1970/09/08  Chief Complaint  Patient presents with  . Follow-up    EMG room 4, alone. Last seen 09/16/17.  Having problems with sciatic nerve on left side. Seeing chiropractor for this.    HISTORICAL  Ruben Silva 47 yo right-handed male, with early-onset idiopathic Parkinson's disease, his primary care physician Dr. Iona Beard, I saw him initially in June 2016  Since October 2015,he was noticed by the family to move at the slower pace, shuffling his gait,gradually getting worse over the past few months, he was also noticed to have right hand shaking, slow to response, decreased facial expression, to the point of needing help to pull up his pants sometimes, he also complains of orthostatic dizziness, difficulty sleeping at nighttime, fatigue, chronic constipation, impotence. 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, he has mild difficulty writing, complains of dizziness, especially with sudden positional change  MRI brain in June 2016 was normal, MRI of cervical spine, mild degenerative disc disease, no significant canal or foraminal stenosis CAT scan of the chest was normal  Laboratory in 2016 showed normal or Negative TSH, ESR, C-reactive protein RPR HIV, B12,CMP CBC, protein electrophoresis, more extensive laboratory evaluations, negative or normal, paraneoplastic panel, ceruloplasmin, copper level, vitamin B1, B12, vitamin D, CA 875, RPR, HIV, folic acid, C-reactive protein, CPK, ESR, protein electrophoresis, Lyme titer, CBC, CMP  He was seen by [facility] movement specialist Dr. Linus Mako in September 2016, concurred with the diagnosis of early-onset idiopathic Parkinson's disease  He  started sinemet since June 2016, gradually titrating dose, which did help his symptoms, he notice wearing off despite quick titrating dose of Sinemet 25/100 mg, 2 tablets 4 times a day, wearing off dyskinesia, he was started on Stalevo 200 mg since February 27 2016 at 6am, 10am, 4pm, 7pm, He goes to bed at 10pm.  But could not tolerate dopamine agonist or Azilect, Azilect-upset stomach, worsening dizziness, Requip-significant GI side effect, dizziness,  He walks regularly 2-4 miles each day.  he was taken to the emergency room in October 2016 after fall, dizziness, repeat CAT scan of the brain in October 2016 was normal  He now complains of constant left shoulder pain  UPDATE May 12 2016: Parkinson's disease: he was started on Stalevo 200 mg since February 27 2016 at 6am, 10am, 4pm, 7pm, He goes to bed at 10pm. He is very happy about current medications, denies significant bradykinesia, able to keep up with his working schedule, complains of orange color discoloration of the urine  Chronic Constipation: Once or twice each week, he took Dulcolax on the weekends,  Insomnia:  He takes clonazepam 1 mg every night works well   Orthostatic dizziness: Worsening with [place] agonist, and Azilect, improved after he stopped medications,  Update June 26 2016: He came here as outlined scheduled urgent visit, woke up in the morning noticed right lateral leg pain, increased gait difficulty because of the pain, continue have chronic constipation despite Linzess '145mg'$  daily treatment  UPDATE Aug 12 2016: He noticed wearing off of his levodopa before the next dose, he is taking Stalevo 50/200/200 at 6 AM,11am, 4pm, 8pm, "hit a brick wall  around 11am every morning".   UPDATE November 10 2016: He complains of worsening slow movement, is now taking Stalevo 50-200-200 5 times a day, he denies significant side effect from medications,  He complains worsening constipation despite Linzess 290 mcg daily,  He also  complains of significant headaches, has been taking Excedrin Migraine once a day for 2 weeks,  he also complains of orthostatic dizziness, lightheaded when getting up quickly, there was one passing out episodes when he first had up from prolonged sitting.  He sleeps well with clonazepam 2 mg every night.   UPDATE February 11 2017: Difficulty sleeping: He has difficulty sleeping, sometimes 3-4 night in a roll, seroqueal 25 mg the prescription was given in July 2017, if he take 25 mg he can sleep better, but has next morning hang over, he is also taking   clonazepam 1 mg 2 tablets at that time  Constipation, even with Linzess '290mg'$  daily  I am able to review CT abdomen scan on December 25 2016 marked gastric distention with no underlying cause identified, this could be due to gastroparesis,  I was able to review repeat CT abdomen on next a December 26 2016 from [facility] system, there was food filled distended stomach and proximal duodenum, EGD is pending on March 02 2017.  His tremor is more prominent, mild peak dose dyskinesia  He is now taking Stalevo 200 at 6, 10, 15, 18, 20, most difficult time is late morning,  UPDATE Jul 06 2017: He is in sublingual apomorphine clinical trial, which has helped his symptoms greatly,  He is now taking Stalevo 200 5 times a day,   He still has chronic constipations, taking  Linzess  290 mg daily. Mestinon '60mg'$  tid, Latulose as needed.  Chronic insomnia, he is taking clonazepam '1mg'$  2 tabs qhs.  UPDATE Sep 16 2017: He took Stalevo at 90, 8, 1400, 1730, 2100,  He took night dose, so he can move function in the morning time.  He complains of right leg pain, no low back pain, around right knee, was seen by orthopedic in the past, no significant abnormality found.   He has chronic cough, sore throat, no fever, feel tired.  He had mold in his house, is going to have his house inspected.   UPDATE December 27 2017: He has mild blurry vision, he has chronic low back pain,  going done to his right leg, lateral thigh, to his right foot, he was seen by chiropractor,  He is taking stalevo 5 times a day.  Also involved in research trial at [facility] with Dr. Deboraha Sprang takes sublingual Apomorphine, but only take it few times every few month   REVIEW OF SYSTEMS: Full 14 system review of systems performed and notable only for as above.   ALLERGIES: No Known Allergies  HOME MEDICATIONS: Current Outpatient Prescriptions  Medication Sig Dispense Refill  . docusate sodium (COLACE) 100 MG capsule   3  . ibuprofen (ADVIL,MOTRIN) 200 MG tablet Take 200 mg by mouth every 6 (six) hours as needed.    . polyethylene glycol powder (GLYCOLAX/MIRALAX) powder     . valsartan-hydrochlorothiazide (DIOVAN-HCT) 160-12.5 MG per tablet        PAST MEDICAL HISTORY: Past Medical History  Diagnosis Date  . Murmur   . Hypertension   . Bell's palsy   . Constipation   . Weakness generalized     PAST SURGICAL HISTORY: Past Surgical History:  Procedure Laterality Date  . NO PAST SURGERIES  FAMILY HISTORY: Family History  Problem Relation Age of Onset  . Hypertension Mother   . Liver disease Mother   . Hypercholesterolemia Mother   . Stroke Father   . Hypertension Father   . Diabetes Father     SOCIAL HISTORY:  History   Social History  . Marital Status: Married    Spouse Name: N/A  . Number of Children: 1  . Years of Education: College   Occupational History  . Government social research officer    Social History Main Topics  . Smoking status: Never Smoker   . Smokeless tobacco: Not on file  . Alcohol Use: No  . Drug Use: No  . Sexual Activity: Not on file   Other Topics Concern  . Not on file   Social History Narrative   Lives at home with wife and son.   Right-hand.   Occasional use of caffeine.     PHYSICAL EXAM   Vitals:   12/27/17 1412  BP: 127/77  Pulse: 69  Weight: 200 lb 8 oz (90.9 kg)  Height: 5' 8.5" (1.74 m)    Not recorded       Body mass index is 30.04 kg/m.  PHYSICAL EXAMNIATION: This exam is performed 4 hours after last dose of Stalevo 200  Gen: NAD, conversant, well nourised, obese, well groomed                     Cardiovascular: Regular rate rhythm, no peripheral edema, warm, nontender. Eyes: Conjunctivae clear without exudates or hemorrhage Neck: Supple, no carotid bruise. Pulmonary: Clear to auscultation bilaterally   NEUROLOGICAL EXAM:  MENTAL STATUS: tired looking middle aged male, mild masked face, Speech:    Speech is normal; fluent and spontaneous with normal comprehension.  Cognition:    The patient is oriented to person, place, and time;     recent and remote memory intact;     language fluent;     normal attention, concentration,     fund of knowledge.  CRANIAL NERVES: CN II: Visual fields are full to confrontation. Fundoscopic exam is normal with sharp discs and no vascular changes. Pupil equal round reactive to light CN III, IV, VI: extraocular movement are normal. No ptosis. CN V: Facial sensation is intact to pinprick in all 3 divisions bilaterally. Corneal responses are intact.  CN VII: Face is symmetric with normal eye closure and smile. CN VIII: Hearing is normal to rubbing fingers CN IX, X: Palate elevates symmetrically. Phonation is normal. CN XI: Head turning and shoulder shrug are intact CN XII: Tongue is midline with normal movements and no atrophy.  MOTOR: He has mild peak dose dyskinesia, examination was done 3 hours after last dose He has mild to moderate bilateral upper and lower extremity, nuchal rigidity, right slightly worse than the left, mild bradykinesia. Tenderness of right lateral leg on deep palpitation  REFLEXES: Reflexes are 2+ and symmetric at the biceps, triceps, knees, and ankles. Plantar responses are flexor.  SENSORY: Light touch, pinprick, position sense, and vibration sense are intact in fingers and toes.  COORDINATION: Rapid alternating  movements and fine finger movements are intact. There is no dysmetria on finger-to-nose and heel-knee-shin.   GAIT/STANCE: He has mildly decreased bilateral arm swing, decreased stride, mild enblock turning. Difficulty clear his feet from floor   DIAGNOSTIC DATA (LABS, IMAGING, TESTING) - I reviewed patient records, labs, notes, testing and imaging myself where available.  ASSESSMENT AND PLAN  Ruben Silva is a 47 y.o.  male    Early onset idiopathic Parkinson's disease  Keep Stalevo to 50/200/200 to 5 times a day every 3 hours.  Could not tolerate azilect, dopamine agonist Requip, due to GI side effect, increased orthostatic dizziness   He also see [facility] movement specialist Dr. Linus Mako, in on trial for sublingual apomorphine study.   Orthostatic dizziness  Encouraging moderate exercise, increase water intake  Chronic Constipation:  Metamucil and Colace every day   Lactulose as needed.  Gastroparesis:  He shows signs of gastroparesis, chronic constipation,  autonomic failures  Stable now.  Chronic insomnia  Keep clonazepam 62mlligrams 2 tabs every night   Worsening chronic low back pain, radiating pain to right lower extremity  MRI of lumbar spine     YMarcial Pacas M.D. Ph.D.  [facility] [address] [place] [address]Ph: [phone]: [phone]

## 2017-12-27 NOTE — Telephone Encounter (Signed)
[employer] Auth: NPR via [employer] website order sent to GI. They will reach out to the pt to schedule.  °

## 2017-12-30 ENCOUNTER — Ambulatory Visit
Admission: RE | Admit: 2017-12-30 | Discharge: 2017-12-30 | Disposition: A | Discharge status: Home / Self Care | Payer: 59 | Source: Ambulatory Visit | Attending: Neurology | Admitting: Neurology

## 2017-12-30 DIAGNOSIS — M5136 Other intervertebral disc degeneration, lumbar region: Secondary | ICD-10-CM

## 2017-12-31 ENCOUNTER — Telehealth: Payer: Self-pay | Admitting: Neurology

## 2017-12-31 DIAGNOSIS — M545 Low back pain: Secondary | ICD-10-CM

## 2017-12-31 NOTE — Telephone Encounter (Signed)
Please call patient, MRI of the lumbar showed mild degenerative changes, there is no evidence of significant canal or foraminal narrowing.  He would benefit continued physical therapy, back stretching exercise, heating pad, as needed NSAIDs.   IMPRESSION: This MRI of the lumbar spine without contrast shows the following: 1.   Mild disc bulging at L3-L4 causing left foraminal narrowing.  There does not appear to be nerve root compression. 2.   Right paramedian disc bulging at L4-L5 causing mild right neural foraminal and lateral recess narrowing.  There does not appear to be any nerve root compression.

## 2018-01-02 ENCOUNTER — Other Ambulatory Visit: Payer: 59

## 2018-01-04 NOTE — Addendum Note (Signed)
Addended by: Lindell Spar C on: 01/04/2018 09:15 AM   Modules accepted: Orders

## 2018-01-04 NOTE — Telephone Encounter (Signed)
Spoke to patient - he is aware of results and agreeable to Dr. Zannie Cove recommendations.  Order placed in Epic for physical therapy.

## 2018-01-08 ENCOUNTER — Telehealth: Payer: Self-pay | Admitting: Neurology

## 2018-01-08 ENCOUNTER — Other Ambulatory Visit: Payer: Self-pay | Admitting: Neurology

## 2018-01-08 NOTE — Telephone Encounter (Addendum)
Patient paged the on-call doctor at 1am Saturday morning because he would like a prescription for viagra. He confirmed no emergency. He is not in any pain, there is no difficulty or any new symptoms.  He is at baseline.There is no urgency for the medication for any reason and and he is not in need of any medical attention whatsoever. He is having sexual difficulty and would like Viagra. The last time he was given a prescription from [name] was in 2017 and he should discuss with her or her team regarding this medication as it may interact with his Sinemet and increase risk of hypotension and cardiacproblems . I discussed with patient and I apologized for his difficulty. I also politely tried to educate that this is something he should call the office about during working hours and not the on-call emergency doctor line at 1am in the morning. Patient asked for my name multiple times and I politely repeated it several times.

## 2018-01-10 MED ORDER — SILDENAFIL CITRATE 20 MG PO TABS: 20.0000 mg | ORAL_TABLET | Freq: Three times a day (TID) | ORAL | 0 refills | Status: AC

## 2018-01-10 NOTE — Addendum Note (Signed)
Addended by: Candis Schatz I on: 01/10/2018 09:52 AM   Modules accepted: Orders

## 2018-01-10 NOTE — Telephone Encounter (Signed)
Spoke with Ruben Silva and explained that on call physicians are for urgent/emergent help only.  They are unable to provide rx. refills on the weekends.  It does not appear that [name] has ever rx. Viagra for pt. before. He confirmed this, sts. has taken Viagra in the past, rx'd by a different physician, believes he was on the smallest dose.  Per YY, ok for Viagra  #10 tablets.  [facility]fim

## 2018-01-10 NOTE — Telephone Encounter (Signed)
Pt called back today. Pt said he was not calling for an emergency but wanted to get a refill sent in for the next day. Pt said he was unaware of the procedure for on call hours. He thought it would be taken care within daytime hours. Pt is wanting to know if he can get refill. Please call to discuss at [phone]. [facility]

## 2018-01-12 ENCOUNTER — Other Ambulatory Visit: Payer: Self-pay | Admitting: Neurology

## 2018-01-13 NOTE — Telephone Encounter (Signed)
Per [name], see pcp, urology to refill, no exchange at pharmacy for med already picked up, no viagra prescribed to replace. Spoke to pt and he verbalized understanding.

## 2018-01-13 NOTE — Telephone Encounter (Signed)
Pt is requesting VIAGRA BRAND and would like to get from pharmacy (even though picked up from pharmacy [facility]).  Are you ok to give prescription?, I relayed that we would no longer refill and he needs to see pcp, or urology for this and he was ok to see them.  I called pharmacy and they do not exchange. Do you want to give him VIAGRA?

## 2018-01-31 ENCOUNTER — Telehealth: Payer: Self-pay | Admitting: Neurology

## 2018-01-31 DIAGNOSIS — G20 Parkinson's disease: Secondary | ICD-10-CM

## 2018-01-31 NOTE — Telephone Encounter (Signed)
Patient calling to get a referral to [facility]. He says they have a therapy program there for Parkinson's patients.

## 2018-01-31 NOTE — Telephone Encounter (Signed)
Ok, per vo by Dr. Terrace ArabiaYan, to place order for PT.

## 2018-01-31 NOTE — Addendum Note (Signed)
Addended by: Lindell SparKIRKMAN, MICHELLE C on: 01/31/2018 11:26 AM   Modules accepted: Orders

## 2018-02-02 NOTE — Telephone Encounter (Signed)
Done Patient is scheduled For June 27 th .

## 2018-02-15 ENCOUNTER — Ambulatory Visit: Payer: 59 | Admitting: Podiatry

## 2018-02-15 ENCOUNTER — Encounter: Payer: Self-pay | Admitting: Podiatry

## 2018-02-15 DIAGNOSIS — T148XXA Other injury of unspecified body region, initial encounter: Secondary | ICD-10-CM

## 2018-02-15 DIAGNOSIS — M79675 Pain in left toe(s): Secondary | ICD-10-CM

## 2018-02-15 DIAGNOSIS — B351 Tinea unguium: Secondary | ICD-10-CM | POA: Diagnosis not present

## 2018-02-15 DIAGNOSIS — M2141 Flat foot [pes planus] (acquired), right foot: Secondary | ICD-10-CM

## 2018-02-15 DIAGNOSIS — M2142 Flat foot [pes planus] (acquired), left foot: Secondary | ICD-10-CM

## 2018-02-15 NOTE — Progress Notes (Signed)
This patient presents to the office concerned about his left big toe left foot.  He says about 2-3 months ago he banged his toe/toenail against his bed,  He said it became initially painful but once the initial pain when resolved. it was only discolored.  He now believes the nail is lifting off the nailbed and a new nail is growing underneath the upper nail.  He presents the office today stating no paid discomfort and desires a evaluation of this discolored nail left foot.  General Appearance  Alert, conversant and in no acute stress.  Vascular  Dorsalis pedis and posterior tibial  pulses are palpable  bilaterally.  Capillary return is within normal limits  bilaterally. Temperature is within normal limits  bilaterally.  Neurologic  Senn-Weinstein monofilament wire test within normal limits  bilaterally. Muscle power within normal limits bilaterally.  Nails blackened nail plate noted on the medial aspect of the left great toenail.  No evidence of any redness, swelling or drainage.  Hematoma noted subungually.  Orthopedic  No limitations of motion of motion feet .  No crepitus or effusions noted.  No bony pathology or digital deformities noted. Pes planus foot type.  Skin  normotropic skin with no porokeratosis noted bilaterally.  No signs of infections or ulcers noted.     Subungual hematoma left hallux  Pes planus.  IE  Debride left hallux toenail.  Discussed this condition with this patient and told him that the nail needs to continue to grow and the blackened nail plate will resolve.  Debridement of the unattached portion of the nail plate is removed.  RTC prn.   Helane GuntherGregory Gregorio Worley DPM

## 2018-02-21 ENCOUNTER — Ambulatory Visit: Payer: 59 | Attending: Family Medicine | Admitting: Physical Therapy

## 2018-02-21 ENCOUNTER — Other Ambulatory Visit: Payer: Self-pay

## 2018-02-21 ENCOUNTER — Encounter: Payer: Self-pay | Admitting: Physical Therapy

## 2018-02-21 DIAGNOSIS — G8929 Other chronic pain: Secondary | ICD-10-CM | POA: Diagnosis present

## 2018-02-21 DIAGNOSIS — R29818 Other symptoms and signs involving the nervous system: Secondary | ICD-10-CM | POA: Insufficient documentation

## 2018-02-21 DIAGNOSIS — R262 Difficulty in walking, not elsewhere classified: Secondary | ICD-10-CM | POA: Diagnosis present

## 2018-02-21 DIAGNOSIS — M545 Low back pain: Secondary | ICD-10-CM

## 2018-02-21 DIAGNOSIS — M25561 Pain in right knee: Secondary | ICD-10-CM | POA: Diagnosis present

## 2018-02-21 NOTE — Therapy (Signed)
[facility] [address] [place], [place], [address] Phone: [phone]   Fax:  [phone]  Physical Therapy Evaluation  Patient Details  Name: Ruben Silva MRN: [id number] Date of Birth: 1971/08/06 Referring Provider: Levert FeinsteinYan, Yijun, MD   Encounter Date: 02/21/2018  PT End of Session - 02/21/18 1046    Visit Number  1    Number of Visits  12    Date for PT Re-Evaluation  04/18/18    Authorization Type  [employer]    Authorization - Visit Number  4 ?3 chiropractor visits prior to PT eval    Authorization - Number of Visits  24    PT Start Time  0932    PT Stop Time  1020    PT Time Calculation (min)  48 min    Activity Tolerance  No increased pain    Behavior During Therapy  Flat affect       Past Medical History:  Diagnosis Date  . Bell's palsy   . Constipation   . Hypertension   . Murmur   . Parkinsons (HCC)   . Weakness generalized     Past Surgical History:  Procedure Laterality Date  . NO PAST SURGERIES      There were no vitals filed for this visit.   Subjective Assessment - 02/21/18 0951    Subjective  "My back is really messed up. I can't sleep" Reports he did not schedule PT evaluation when MD first referred him to PT for back pain (01/04/18) because he instead went to see a chiropractor. He is unsure how many times he has seen the chiropractor, but thinks it is definitely more than 3. Agrees he wants to primarily work on his back pain before addessing PD issues.     Pertinent History  Bell's palsy, HTN, Parkinsons    Diagnostic tests  MRI of the lumbar showed mild degenerative changes, there is no evidence of significant canal or foraminal narrowing    Currently in Pain?  Yes    Pain Score  9     Pain Location  Back    Pain Orientation  Right    Pain Descriptors / Indicators  Spasm;Tightness    Pain Type  Acute pain    Pain Radiating Towards  posterior knee    Pain Onset  More than a month ago     Pain Frequency  Intermittent    Aggravating Factors   standing in one spot; bed/mattress; rt sidelying    Pain Relieving Factors  get off of it;         [facility] PT Assessment - 02/21/18 0942      Assessment   Medical Diagnosis  low back pain/Parkinson's Disease    Referring Provider  Levert FeinsteinYan, Yijun, MD    Onset Date/Surgical Date  -- PD diagnosed June 2016; saw MD for back pain 01/04/18    Prior Therapy  chiropractor      Precautions   Precautions  Fall      Restrictions   Weight Bearing Restrictions  No      Balance Screen   Has the patient fallen in the past 6 months  No    Has the patient had a decrease in activity level because of a fear of falling?   No    Is the patient reluctant to leave their home because of a fear of falling?   No      Home Environment   Living Environment  Private residence    Living Arrangements  Children 96 yo son    Available Help at Discharge  Family    Type of Home  House    Home Access  Stairs to enter    Entrance Stairs-Number of Steps  2    Entrance Stairs-Rails  None    Home Layout  Two level;Bed/bath upstairs    Alternate Level Stairs-Number of Steps  18    Alternate Level Stairs-Rails  Right    Home Equipment  None      Prior Function   Level of Independence  Independent    Vocation  Full time employment    Careers information officer of sitting    Leisure  boxing; cycling (does this on his own, not the class)      Cognition   Overall Cognitive Status  No family/caregiver present to determine baseline cognitive functioning    Behaviors  Other (comment) flat affect      Observation/Other Assessments   Observations  ambulates slowly reportedly due to back pain (? also bradykinesia with PD)    Focus on Therapeutic Outcomes (FOTO)   NA      Observation/Other Assessments-Edema    Edema  -- pt with palpable edema posterior rt knee      Sensation   Light Touch  Appears Intact      Coordination   Gross Motor  Movements are Fluid and Coordinated  Yes    Fine Motor Movements are Fluid and Coordinated  Not tested      Posture/Postural Control   Posture/Postural Control  Postural limitations    Postural Limitations  Rounded Shoulders;Forward head;Decreased lumbar lordosis;Posterior pelvic tilt      ROM / Strength   AROM / PROM / Strength  AROM;Strength      AROM   Overall AROM   Deficits    Overall AROM Comments  rt knee 10 to 108 flexion (unable to fully extend to 0); limited by posterior knee pain      Strength   Overall Strength  Deficits    Overall Strength Comments  rt knee extension 5; knee flexion 4; left knee extension 5, flexion 5      Transfers   Five time sit to stand comments   13.22 norm for age 81.2 sec      Ambulation/Gait   Ambulation/Gait  Yes    Ambulation/Gait Assistance  6: Modified independent (Device/Increase time)    Ambulation Distance (Feet)  40 Feet x 3    Assistive device  None    Gait Pattern  Step-through pattern;Decreased arm swing - right;Decreased arm swing - left;Decreased step length - right;Decreased step length - left;Decreased weight shift to right;Decreased trunk rotation    Ambulation Surface  Indoor    Gait velocity  32.8/13.47=2.44 ft/sec norm for age 89.72 ft/sec; safe community ambulation min 2.62      Standardized Balance Assessment   Standardized Balance Assessment  Timed Up and Go Test      Timed Up and Go Test   Normal TUG (seconds)  5.94                Objective measurements completed on examination: See above findings.              PT Education - 02/21/18 1045    Education Details  patient's main issue related to mobility is currently his back pain, therefore will intially address this pain and then focus on Parkinson's related symptoms  Person(s) Educated  Patient    Methods  Explanation    Comprehension  Verbalized understanding       PT Short Term Goals - 02/21/18 1957      PT SHORT TERM GOAL #1   Title   Patient will be independent with basic HEP (Target for all STGs: 03/21/2018)    Time  4    Period  Weeks    Status  New    Target Date  03/21/18      PT SHORT TERM GOAL #2   Title  Patient will improve gait velocity to >=2.62 ft/sec to indicate safe community ambulation.    Time  4    Period  Weeks    Status  New      PT SHORT TERM GOAL #3   Title  Patient will report a 30% decrease in his back pain (overall).    Time  4    Period  Weeks    Status  New        PT Long Term Goals - 02/21/18 2005      PT LONG TERM GOAL #1   Title  Patient will be independent with updated HEP and able to verbalize a plan for appropriate community-based exercise upon discharge from PT. (Target for all LTGs: 04/18/2018)    Time  8    Period  Weeks    Status  New    Target Date  04/18/18      PT LONG TERM GOAL #2   Title  Patient will demonstrate gait velocity >=3.0 ft/sec (closer to norm for his age) to indicate a lesser fall risk and lesser back pain.     Time  8    Period  Weeks    Status  New      PT LONG TERM GOAL #3   Title  Patient will report >=50% reduction in back pain     Time  8    Period  Weeks    Status  New      PT LONG TERM GOAL #4   Title  Patient will verbalize knowledge of local resources related to Parkinson's diagnosis (support groups, exercise classes)    Time  8    Period  Weeks    Status  New             Plan - 02/21/18 1937    Clinical Impression Statement  47 yo referred to OPPT by [name] initially for back pain (01/04/18) and then for Parkinsons Disease (01/31/18). Patient presents with immediate statement that his back pain is most effecting his daily activities and wants to initially focus on his back pain. Today he reported his pain is 9 out of 10. He reports he has been told he has sciatic nerve pain, however he denies radiating pain from low back down posterior thigh to his knee. Instead he reports lumbar pain and posterior knee pain with no pain of  buttock/hip/posterior thigh. He has had no xrays or scans of rt knee (multiple scans of lt knee, however he repeatedly denied left knee pain currently). Anticipate as back pain lessens, his mobility issues related to Parkinsons will become more apparent. Patient will benefit from PT to address the additional deficits listed below via  the interventions listed below.     History and Personal Factors relevant to plan of care:  PMH-Bell's palsy, HTN, Parkinsons (diagnosed June 2016); Personal factors- limited support (recently separated from his wife); coping skills; unknown mechanism  of injury    Clinical Presentation  Evolving    Clinical Presentation due to:  back pain of unclear origin (despite MRI) with up to 9 out of 10 pain    Clinical Decision Making  Moderate    Rehab Potential  Fair    Clinical Impairments Affecting Rehab Potential  unknown origin of back and rt knee pain    PT Frequency  Other (comment) 2x/wk for 4 weeks; 1x/week for weeks    PT Duration  8 weeks    PT Treatment/Interventions  ADLs/Self Care Home Management;Aquatic Therapy;Moist Heat;Electrical Stimulation;Ultrasound;DME Instruction;Gait training;Cognitive remediation;Neuromuscular re-education;Balance training;Therapeutic exercise;Therapeutic activities;Functional mobility training;Patient/family education;Manual techniques;Passive range of motion    PT Next Visit Plan  intiate basic back care HEP; ?try supine PWR! (?if PD stiffness is part of his back pain)    Recommended Other Services  ?OT consult (pt mentioned stiffness and difficulty holding objects)--will need to clarify available # appts covered by insurance    Consulted and Agree with Plan of Care  Patient       Patient will benefit from skilled therapeutic intervention in order to improve the following deficits and impairments:  Decreased activity tolerance, Decreased cognition, Decreased mobility, Decreased knowledge of use of DME, Decreased range of motion,  Decreased strength, Difficulty walking, Impaired flexibility, Postural dysfunction, Pain  Visit Diagnosis: Difficulty in walking, not elsewhere classified - Plan: PT plan of care cert/re-cert  Midline low back pain, unspecified chronicity, with sciatica presence unspecified - Plan: PT plan of care cert/re-cert  Other symptoms and signs involving the nervous system - Plan: PT plan of care cert/re-cert  Chronic pain of right knee - Plan: PT plan of care cert/re-cert     Problem List Patient Active Problem List   Diagnosis Date Noted  . Right lumbar radiculopathy 12/27/2017  . Gastroparesis 07/06/2017  . Orthostatic dizziness 05/12/2016  . Low back pain 05/12/2016  . Chronic constipation 12/17/2015  . Chronic insomnia 12/17/2015  . Left hip pain 12/16/2015  . Parkinson's disease (HCC) 09/11/2015  . HYPERTENSION 11/21/2008  . MURMUR 11/21/2008  . CHEST PAIN, ATYPICAL 11/21/2008    Zena Amos, PT 02/21/2018, 8:16 PM  [facility] [address] [place], [place], [address] Phone: [phone]   Fax:  [phone]  Name: Ruben Silva MRN: [id number] Date of Birth: [birth year]

## 2018-02-24 ENCOUNTER — Ambulatory Visit: Payer: 59 | Admitting: Physical Therapy

## 2018-02-24 ENCOUNTER — Encounter: Payer: Self-pay | Admitting: Physical Therapy

## 2018-02-24 DIAGNOSIS — M545 Low back pain: Secondary | ICD-10-CM

## 2018-02-24 DIAGNOSIS — R29818 Other symptoms and signs involving the nervous system: Secondary | ICD-10-CM

## 2018-02-24 DIAGNOSIS — R262 Difficulty in walking, not elsewhere classified: Secondary | ICD-10-CM | POA: Diagnosis not present

## 2018-02-25 ENCOUNTER — Encounter: Payer: Self-pay | Admitting: Physical Therapy

## 2018-02-25 NOTE — Therapy (Signed)
[facility] [address] [place], [place], [address] Phone: [phone]   Fax:  [phone]  Physical Therapy Treatment  Patient Details  Name: Ruben Silva MRN: [id number] Date of Birth: May 03, 1971 Referring Provider: Levert Feinstein, MD   Encounter Date: 02/24/2018  PT End of Session - 02/25/18 1812    Visit Number  2    Number of Visits  12    Date for PT Re-Evaluation  04/18/18    Authorization Type  [employer]    Authorization - Visit Number  5 ?3 chiropractor visits prior to PT eval    Authorization - Number of Visits  24    PT Start Time  1318    PT Stop Time  1401    PT Time Calculation (min)  43 min    Activity Tolerance  Patient tolerated treatment well rates pain 8/10 at end of session    Behavior During Therapy  Flat affect       Past Medical History:  Diagnosis Date  . Bell's palsy   . Constipation   . Hypertension   . Murmur   . Parkinsons (HCC)   . Weakness generalized     Past Surgical History:  Procedure Laterality Date  . NO PAST SURGERIES      There were no vitals filed for this visit.  Subjective Assessment - 02/24/18 1323    Subjective  Think I might have a Baker's cyst so I'm going to the doctor tomorrow.    Pertinent History  Bell's palsy, HTN, Parkinsons    Diagnostic tests  MRI of the lumbar showed mild degenerative changes, there is no evidence of significant canal or foraminal narrowing    Currently in Pain?  Yes    Pain Score  6     Pain Location  Back    Pain Orientation  Lower    Pain Descriptors / Indicators  -- deep    Pain Onset  More than a month ago    Aggravating Factors   standing in one spot    Pain Relieving Factors  tylenol alleviates                       Attempted basic back care exercises, including double knee to chest (pt able to demo that he does at home), single knee to chest (x 1 rep each side).  Seated anterior/posterior pelvic tilt to  help pt find neutral spine with upright posture.  Pt reverts back to forward flexed posture after cueing regarding more upright seated posture.   [facility]) - 02/24/18 1337    PWR! exercises  Moves in supine;Moves in sitting    PWR! Up  x 10 reps through shoulders, then 10 reps through hips    PWR! Rock  x 10 reps     PWR! Twist  x 10 reps    PWR! Step  x 5 reps each side    Comments  PWR! Moves in supine for posture, weightshifting, trunk flexibility, and initial stepping    PWR! Up  x 10 reps     PWR! Rock  x 5 reps each side    PWR! Twist  x 5 reps each side    PWR! Step  x 5 reps each side    Comments  PWR! MOves in sitting performed for posture, trunk flexibility, weightshifting, and initial stepping.  Verbal and visual cues provided for technique and amplitude of movement pattern.  Mini-BESTest: Balance Evaluation Systems Test  2005-2013 [facility] [employer]. All rights reserved. ________________________________________________________________________________________Anticipatory_________Subscore___5__/6 1. SIT TO STAND Instruction: "Cross your arms across your chest. Try not to use your hands unless you must.Do not let your legs lean against the back of the chair when you stand. Please stand up now." X(2) Normal: Comes to stand without use of hands and stabilizes independently. (1) Moderate: Comes to stand WITH use of hands on first attempt. (0) Severe: Unable to stand up from chair without assistance, OR needs several attempts with use of hands. 2. RISE TO TOES Instruction: "Place your feet shoulder width apart. Place your hands on your hips. Try to rise as high as you can onto your toes. I will count out loud to 3 seconds. Try to hold this pose for at least 3 seconds. Look straight ahead. Rise now." X(2) Normal: Stable for 3 s with maximum height. (1) Moderate: Heels up, but not full range (smaller than when holding hands), OR noticeable instability for 3  s. (0) Severe: < 3 s. 3. STAND ON ONE LEG Instruction: "Look straight ahead. Keep your hands on your hips. Lift your leg off of the ground behind you without touching or resting your raised leg upon your other standing leg. Stay standing on one leg as long as you can. Look straight ahead. Lift now." Left: Time in Seconds Trial 1:_____Trial 2:_____ X(2) Normal: 20 s. (1) Moderate: < 20 s. (0) Severe: Unable. Right: Time in Seconds Trial 1:_2.25____Trial 2:__9.53___ (2) Normal: 20 s. X(1) Moderate: < 20 s. (0) Severe: Unable To score each side separately use the trial with the longest time. To calculate the sub-score and total score use the side [left or right] with the lowest numerical score [i.e. the worse side]. ______________________________________________________________________________________Reactive Postural Control___________Subscore:___6__/6 4. COMPENSATORY STEPPING CORRECTION- FORWARD Instruction: "Stand with your feet shoulder width apart, arms at your sides. Lean forward against my hands beyond your forward limits. When I let go, do whatever is necessary, including taking a step, to avoid a fall." X(2) Normal: Recovers independently with a single, large step (second realignment step is allowed). (1) Moderate: More than one step used to recover equilibrium. (0) Severe: No step, OR would fall if not caught, OR falls spontaneously. 5. COMPENSATORY STEPPING CORRECTION- BACKWARD Instruction: "Stand with your feet shoulder width apart, arms at your sides. Lean backward against my hands beyond your backward limits. When I let go, do whatever is necessary, including taking a step, to avoid a fall." X(2) Normal: Recovers independently with a single, large step. (1) Moderate: More than one step used to recover equilibrium. (0) Severe: No step, OR would fall if not caught, OR falls spontaneously. 6. COMPENSATORY STEPPING CORRECTION- LATERAL Instruction: "Stand with your feet together,  arms down at your sides. Lean into my hand beyond your sideways limit. When I let go, do whatever is necessary, including taking a step, to avoid a fall." Left X(2) Normal: Recovers independently with 1 step (crossover or lateral OK). (1) Moderate: Several steps to recover equilibrium. (0) Severe: Falls, or cannot step. Right X(2) Normal: Recovers independently with 1 step (crossover or lateral OK). (1) Moderate: Several steps to recover equilibrium. (0) Severe: Falls, or cannot step. Use the side with the lowest score to calculate sub-score and total score. ____________________________________________________________________________________Sensory Orientation_____________Subscore:_____6____/6 7. STANCE (FEET TOGETHER); EYES OPEN, FIRM SURFACE Instruction: "Place your hands on your hips. Place your feet together until almost touching. Look straight ahead. Be as stable and still as possible, until I  say stop." Time in seconds:________ X(2) Normal: 30 s. (1) Moderate: < 30 s. (0) Severe: Unable. 8. STANCE (FEET TOGETHER); EYES CLOSED, FOAM SURFACE Instruction: "Step onto the foam. Place your hands on your hips. Place your feet together until almost touching. Be as stable and still as possible, until I say stop. I will start timing when you close your eyes." Time in seconds:________ X(2) Normal: 30 s. (1) Moderate: < 30 s. (0) Severe: Unable. 9. INCLINE- EYES CLOSED Instruction: "Step onto the incline ramp. Please stand on the incline ramp with your toes toward the top. Place your feet shoulder width apart and have your arms down at your sides. I will start timing when you close your eyes." Time in seconds:________ X(2) Normal: Stands independently 30 s and aligns with gravity. (1) Moderate: Stands independently <30 s OR aligns with surface. (0) Severe: Unable. _________________________________________________________________________________________Dynamic Gait  ______Subscore_____7___/10 10. CHANGE IN GAIT SPEED Instruction: "Begin walking at your normal speed, when I tell you 'fast', walk as fast as you can. When I say 'slow', walk very slowly." X(2) Normal: Significantly changes walking speed without imbalance. (1) Moderate: Unable to change walking speed or signs of imbalance. (0) Severe: Unable to achieve significant change in walking speed AND signs of imbalance. 11. WALK WITH HEAD TURNS - HORIZONTAL Instruction: "Begin walking at your normal speed, when I say "right", turn your head and look to the right. When I say "left" turn your head and look to the left. Try to keep yourself walking in a straight line." (2) Normal: performs head turns with no change in gait speed and good balance. X(1) Moderate: performs head turns with reduction in gait speed. (0) Severe: performs head turns with imbalance. 12. WALK WITH PIVOT TURNS Instruction: "Begin walking at your normal speed. When I tell you to 'turn and stop', turn as quickly as you can, face the opposite direction, and stop. After the turn, your feet should be close together." X(2) Normal: Turns with feet close FAST (< 3 steps) with good balance. (1) Moderate: Turns with feet close SLOW (>4 steps) with good balance. (0) Severe: Cannot turn with feet close at any speed without imbalance. 13. STEP OVER OBSTACLES Instruction: "Begin walking at your normal speed. When you get to the box, step over it, not around it and keep walking." (2) Normal: Able to step over box with minimal change of gait speed and with good balance. X(1) Moderate: Steps over box but touches box OR displays cautious behavior by slowing gait. (0) Severe: Unable to step over box OR steps around box. 14. TIMED UP & GO WITH DUAL TASK [3 METER WALK] Instruction TUG: "When I say 'Go', stand up from chair, walk at your normal speed across the tape on the floor, turn around, and come back to sit in the chair." Instruction TUG with  Dual Task: "Count backwards by threes starting at ___. When I say 'Go', stand up from chair, walk at your normal speed across the tape on the floor, turn around, and come back to sit in the chair. Continue counting backwards the entire time." TUG: ____9.44____seconds; Dual Task TUG: ___12.25_____seconds (2) Normal: No noticeable change in sitting, standing or walking while backward counting when compared to TUG without Dual Task. (1) Moderate: Dual Task affects either counting OR walking (>10%) when compared to the TUG without Dual Task. (0) Severe: Stops counting while walking OR stops walking while counting. When scoring item 14, if subject's gait speed slows more than 10% between the  TUG without and with a Dual Task the score should be decreased by a point. TOTAL SCORE: _____24___/28    PT Education - 02/25/18 1811    Education Details  Discussed how Parkinson's stiffness/rigidity and postural changes can affect back pain, and began exercises to address (did not yet give for home); discussed optimal posture, practicing upright sitting posture edge of mat.    Person(s) Educated  Patient    Methods  Explanation;Demonstration    Comprehension  Verbalized understanding;Returned demonstration;Verbal cues required     Briefly discussed pt's knee pain, and he plans to see MD for that on 02/25/18.  Discussed POC in regards to visit limit and visits used from chiropractor.  Discussed trying to complete course of therapy without chiropractor visits to know for sure what is helping/not helping.  PT Short Term Goals - 02/21/18 1957      PT SHORT TERM GOAL #1   Title  Patient will be independent with basic HEP (Target for all STGs: 03/21/2018)    Time  4    Period  Weeks    Status  New    Target Date  03/21/18      PT SHORT TERM GOAL #2   Title  Patient will improve gait velocity to >=2.62 ft/sec to indicate safe community ambulation.    Time  4    Period  Weeks    Status  New      PT SHORT  TERM GOAL #3   Title  Patient will report a 30% decrease in his back pain (overall).    Time  4    Period  Weeks    Status  New        PT Long Term Goals - 02/21/18 2005      PT LONG TERM GOAL #1   Title  Patient will be independent with updated HEP and able to verbalize a plan for appropriate community-based exercise upon discharge from PT. (Target for all LTGs: 04/18/2018)    Time  8    Period  Weeks    Status  New    Target Date  04/18/18      PT LONG TERM GOAL #2   Title  Patient will demonstrate gait velocity >=3.0 ft/sec (closer to norm for his age) to indicate a lesser fall risk and lesser back pain.     Time  8    Period  Weeks    Status  New      PT LONG TERM GOAL #3   Title  Patient will report >=50% reduction in back pain     Time  8    Period  Weeks    Status  New      PT LONG TERM GOAL #4   Title  Patient will verbalize knowledge of local resources related to Parkinson's diagnosis (support groups, exercise classes)    Time  8    Period  Weeks    Status  New            Plan - 02/25/18 1814    Clinical Impression Statement  Skilled PT session this visit attempted to address back pain initially, however, with basic back exercises in supine, pt reports he is already doing those either with [employer] boxing or has done at chiropractor, having a hard time focusing on progression of basic back exercises, given pt doesn't complete a full set.  No reports of back pain during the exercises, but by session's end, he reports increase  from 6 to 8/10.  Attempted supine and seated PWR! Moves activities to address posture, trunk flexibility to address stiffness/back pain, and pt needs multiple cues throughout to fully and correctly complete set of exercises.  Ended session with MiniBESTest looking at balance, with score of 24/28 (difficulty with RLE SLS, TUG/TUG cog> 10% difference, obstacle negotiation and head turns)    Rehab Potential  Fair    Clinical Impairments  Affecting Rehab Potential  unknown origin of back and rt knee pain    PT Frequency  Other (comment) 2x/wk for 4 weeks; 1x/week for weeks    PT Duration  8 weeks    PT Treatment/Interventions  ADLs/Self Care Home Management;Aquatic Therapy;Moist Heat;Electrical Stimulation;Ultrasound;DME Instruction;Gait training;Cognitive remediation;Neuromuscular re-education;Balance training;Therapeutic exercise;Therapeutic activities;Functional mobility training;Patient/family education;Manual techniques;Passive range of motion    PT Next Visit Plan  try again to intiate basic back care HEP; ?try again supine PWR! (?if PD stiffness is part of his back pain)    Recommended Other Services  ask pt about OT consult    Consulted and Agree with Plan of Care  Patient       Patient will benefit from skilled therapeutic intervention in order to improve the following deficits and impairments:  Decreased activity tolerance, Decreased cognition, Decreased mobility, Decreased knowledge of use of DME, Decreased range of motion, Decreased strength, Difficulty walking, Impaired flexibility, Postural dysfunction, Pain  Visit Diagnosis: Midline low back pain, unspecified chronicity, with sciatica presence unspecified  Other symptoms and signs involving the nervous system     Problem List Patient Active Problem List   Diagnosis Date Noted  . Right lumbar radiculopathy 12/27/2017  . Gastroparesis 07/06/2017  . Orthostatic dizziness 05/12/2016  . Low back pain 05/12/2016  . Chronic constipation 12/17/2015  . Chronic insomnia 12/17/2015  . Left hip pain 12/16/2015  . Parkinson's disease (HCC) 09/11/2015  . HYPERTENSION 11/21/2008  . MURMUR 11/21/2008  . CHEST PAIN, ATYPICAL 11/21/2008    Jonathandavid Marlett W. 02/25/2018, 6:21 PM  Gean Maidens., PT  Gean Maidens., PT    [facility] [address] [place], [place], [address] Phone: [phone]   Fax:   [phone]  Name: Ruben Silva MRN: [id number] Date of Birth: 09/04/1970

## 2018-03-04 ENCOUNTER — Encounter

## 2018-03-07 ENCOUNTER — Ambulatory Visit: Payer: 59 | Attending: Family Medicine | Admitting: Physical Therapy

## 2018-03-07 ENCOUNTER — Encounter: Payer: Self-pay | Admitting: Physical Therapy

## 2018-03-07 ENCOUNTER — Telehealth: Payer: Self-pay | Admitting: Physical Therapy

## 2018-03-07 DIAGNOSIS — G20 Parkinson's disease: Secondary | ICD-10-CM

## 2018-03-07 DIAGNOSIS — R29818 Other symptoms and signs involving the nervous system: Secondary | ICD-10-CM | POA: Diagnosis present

## 2018-03-07 DIAGNOSIS — M545 Low back pain: Secondary | ICD-10-CM | POA: Insufficient documentation

## 2018-03-07 NOTE — Telephone Encounter (Signed)
Dr. Terrace ArabiaYan,  Magda KielCharles Silva was evaluated by Physical Therapy on 02/21/18.  The patient would benefit from Occupational Therapy evaluation to assess his bilateral hand function and appropriate stretches for his hands. He can also benefit from a Speech/Language evaluation for his very quiet voice with difficulty being understood.    If you agree, please place order(s) in [facility] or fax the order to [phone].  Thank you,  Veda CanningLynn Dijon Cosens, PT Outpatient Neuro[facility] [address], [address] [place], [place] [address] [phone]

## 2018-03-07 NOTE — Therapy (Signed)
[facility] [address] [place], [place], [address] Phone: [phone]   Fax:  [phone]  Physical Therapy Treatment  Patient Details  Name: Ruben Silva MRN: [id number] Date of Birth: [birth year] Referring Provider: Levert Feinstein, MD   Encounter Date: 03/07/2018  PT End of Session - 03/07/18 1620    Visit Number  3    Number of Visits  12    Date for PT Re-Evaluation  04/18/18    Authorization Type  [employer]    Authorization - Visit Number  6 ?3 chiropractor visits prior to PT eval    Authorization - Number of Visits  24    PT Start Time  1405    PT Stop Time  1445    PT Time Calculation (min)  40 min    Activity Tolerance  Patient tolerated treatment well;No increased pain    Behavior During Therapy  Flat affect       Past Medical History:  Diagnosis Date  . Bell's palsy   . Constipation   . Hypertension   . Murmur   . Parkinsons (HCC)   . Weakness generalized     Past Surgical History:  Procedure Laterality Date  . NO PAST SURGERIES      There were no vitals filed for this visit.  Subjective Assessment - 03/07/18 1607    Subjective  Reports his right knee pain continues to effect his walking and he thinks is effecting his back pain (due to altered gait). States he is having an MRI of his knee this week.     Pertinent History  Bell's palsy, HTN, Parkinsons    Diagnostic tests  MRI of the lumbar showed mild degenerative changes, there is no evidence of significant canal or foraminal narrowing    Patient Stated Goals  reduce back and rt knee pain; improve his walking    Currently in Pain?  Yes    Pain Score  3     Pain Location  Knee    Pain Orientation  Right    Pain Descriptors / Indicators  Nagging;Sore    Pain Type  Acute pain    Pain Onset  More than a month ago    Pain Frequency  Intermittent    Aggravating Factors   walking, standing    Pain Relieving Factors  rest, tylenol                        [facility] Adult PT Treatment/Exercise - 03/07/18 1610      Ambulation/Gait   Ambulation/Gait  Yes    Ambulation/Gait Assistance  6: Modified independent (Device/Increase time)    Ambulation Distance (Feet)  100 Feet    Assistive device  None    Gait Pattern  Step-through pattern;Decreased arm swing - right;Decreased arm swing - left;Decreased step length - right;Decreased step length - left;Decreased weight shift to right;Decreased trunk rotation;Antalgic    Ambulation Surface  Indoor      Exercises   Exercises  Lumbar      Lumbar Exercises: Stretches   Active Hamstring Stretch  Right;Left;2 reps;30 seconds    Active Hamstring Stretch Limitations  supine holding posterior thigh and straightening his knee vs with belt around foot and raising leg    Single Knee to Chest Stretch  Right;Left;2 reps;30 seconds    Single Knee to Chest Stretch Limitations  reports little stretch felt on left side and increased pain (not stretching feel) on rt  side therefore discontinued as pain persisted despite modifications    Lower Trunk Rotation  2 reps;30 seconds bil    Pelvic Tilt  5 reps;5 seconds      Lumbar Exercises: Supine   Bent Knee Raise  10 reps alternating bil LEs after core "set"    Bridge  5 reps;5 seconds count out loud to not hold his breath; only lift 1/2 way         P[facility]) - 03/07/18 1617    PWR! exercises  Moves in sitting;Moves in supine    Comments  did not have time to practice; provided handout and verbally reviewed what he did during previous session    PWR! Up  5 limited time; vc for technique, relaxing shoulders    PWR! Rock  2    PWR! Twist  4    PWR! Step  2    Comments  limited time to review; will plan to review next visit          PT Education - 03/07/18 1619    Education Details  back care exercises; do not hold your breath during exercises--taught to count out loud. Pt inquiring re: OT and SLP referrals. Discussed services  each discipline provides, his 24 VL, and what his priorities for therapy are (he hopes to be given an HEP from each discipline).    Person(s) Educated  Patient    Methods  Explanation;Demonstration;Verbal cues;Handout;Tactile cues    Comprehension  Verbalized understanding;Returned demonstration;Verbal cues required;Tactile cues required;Need further instruction       PT Short Term Goals - 02/21/18 1957      PT SHORT TERM GOAL #1   Title  Patient will be independent with basic HEP (Target for all STGs: 03/21/2018)    Time  4    Period  Weeks    Status  New    Target Date  03/21/18      PT SHORT TERM GOAL #2   Title  Patient will improve gait velocity to >=2.62 ft/sec to indicate safe community ambulation.    Time  4    Period  Weeks    Status  New      PT SHORT TERM GOAL #3   Title  Patient will report a 30% decrease in his back pain (overall).    Time  4    Period  Weeks    Status  New        PT Long Term Goals - 02/21/18 2005      PT LONG TERM GOAL #1   Title  Patient will be independent with updated HEP and able to verbalize a plan for appropriate community-based exercise upon discharge from PT. (Target for all LTGs: 04/18/2018)    Time  8    Period  Weeks    Status  New    Target Date  04/18/18      PT LONG TERM GOAL #2   Title  Patient will demonstrate gait velocity >=3.0 ft/sec (closer to norm for his age) to indicate a lesser fall risk and lesser back pain.     Time  8    Period  Weeks    Status  New      PT LONG TERM GOAL #3   Title  Patient will report >=50% reduction in back pain     Time  8    Period  Weeks    Status  New      PT LONG TERM GOAL #4  Title  Patient will verbalize knowledge of local resources related to Parkinson's diagnosis (support groups, exercise classes)    Time  8    Period  Weeks    Status  New            Plan - 03/07/18 1622    Clinical Impression Statement  Patient agreeable to education in Basic Back care (stretches  and exercises). Required frequent cues to re-direct to task (or to continue exercise for specified time or reps). Patient educated on rationale for each exercise in addition to proper technique. Ended session with brief review of PWR! exercises he learned during previous session. Will begin next session with these exercises for further instruction as he required mulitiple cues.     Rehab Potential  Fair    Clinical Impairments Affecting Rehab Potential  unknown origin of back and rt knee pain    PT Frequency  Other (comment) 2x/wk for 4 weeks; 1x/week for weeks    PT Duration  8 weeks    PT Treatment/Interventions  ADLs/Self Care Home Management;Aquatic Therapy;Moist Heat;Electrical Stimulation;Ultrasound;DME Instruction;Gait training;Cognitive remediation;Neuromuscular re-education;Balance training;Therapeutic exercise;Therapeutic activities;Functional mobility training;Patient/family education;Manual techniques;Passive range of motion    PT Next Visit Plan  check if MD entered OT and SLP orders; try again supine and sitting PWR! (needs multiple cues); work on ambulation (as back and knee allow); see if questions re: basic back ex's and teach last 3 exercises (wall slide, gastroc stretch-runner's, standing back bends)    Consulted and Agree with Plan of Care  Patient       Patient will benefit from skilled therapeutic intervention in order to improve the following deficits and impairments:  Decreased activity tolerance, Decreased cognition, Decreased mobility, Decreased knowledge of use of DME, Decreased range of motion, Decreased strength, Difficulty walking, Impaired flexibility, Postural dysfunction, Pain  Visit Diagnosis: Midline low back pain, unspecified chronicity, with sciatica presence unspecified  Other symptoms and signs involving the nervous system     Problem List Patient Active Problem List   Diagnosis Date Noted  . Right lumbar radiculopathy 12/27/2017  . Gastroparesis  07/06/2017  . Orthostatic dizziness 05/12/2016  . Low back pain 05/12/2016  . Chronic constipation 12/17/2015  . Chronic insomnia 12/17/2015  . Left hip pain 12/16/2015  . Parkinson's disease (HCC) 09/11/2015  . HYPERTENSION 11/21/2008  . MURMUR 11/21/2008  . CHEST PAIN, ATYPICAL 11/21/2008    Zena Amos, PT 03/07/2018, 5:38 PM  [facility] [address] [place], [place], [address] Phone: [phone]   Fax:  [phone]  Name: Ruben Silva MRN: [id number] Date of Birth: [birth year]

## 2018-03-07 NOTE — Patient Instructions (Signed)
Patient provided with Basic Back care handout (removed exercises that were not appropriate or tolerated by him).   Provided with PWR! Sitting and Supine exercise handouts as pt reports he misplaced these.

## 2018-03-08 NOTE — Telephone Encounter (Signed)
OT, ST order placed.

## 2018-03-09 ENCOUNTER — Ambulatory Visit: Payer: 59 | Admitting: Physical Therapy

## 2018-03-10 ENCOUNTER — Ambulatory Visit: Payer: 59 | Admitting: Physical Therapy

## 2018-03-15 ENCOUNTER — Ambulatory Visit: Payer: 59 | Admitting: Physical Therapy

## 2018-03-17 ENCOUNTER — Ambulatory Visit: Payer: 59 | Admitting: Physical Therapy

## 2018-03-21 ENCOUNTER — Ambulatory Visit: Payer: 59 | Admitting: Physical Therapy

## 2018-03-22 ENCOUNTER — Telehealth: Payer: Self-pay | Admitting: Psychology

## 2018-03-22 NOTE — Telephone Encounter (Signed)
TC to patient as I met him at the Parkinson's symposium and immediately noticed that the patient would be appropriate for the young onset Parkinson's group that I have started.  He meets criteria because of his age and also because he is very active.  We offer exercise for this population 2 times a week and there are some social opportunities in this group. This is a closed group unless they meet criteria.  Ruben Silva is interested in participating and gave me permission to share his contact information with the person who leads the exercise portion of the group and his contact information with the person who leads the social part at this group.

## 2018-03-24 ENCOUNTER — Encounter: Payer: Self-pay | Admitting: Physical Therapy

## 2018-03-24 ENCOUNTER — Ambulatory Visit: Payer: 59 | Admitting: Physical Therapy

## 2018-03-24 DIAGNOSIS — G20 Parkinson's disease: Secondary | ICD-10-CM

## 2018-03-24 DIAGNOSIS — M545 Low back pain: Secondary | ICD-10-CM

## 2018-03-24 DIAGNOSIS — R29818 Other symptoms and signs involving the nervous system: Secondary | ICD-10-CM

## 2018-03-24 NOTE — Patient Instructions (Addendum)
  Patient requesting information on what machines to do at the gym based on his knee pain and patellar "arthritis":    1. Yes to leg press  2 Yes to bike  3. No to knee extension  4. Bench ok  5. Dips ok  In general

## 2018-03-24 NOTE — Therapy (Signed)
[facility] [address] [place], [place], [address] Phone: [phone]   Fax:  [phone]  Physical Therapy Treatment  Patient Details  Name: Ruben Silva MRN: [id number] Date of Birth: [birth year] Referring Provider: Levert Feinstein, MD   Encounter Date: 03/24/2018  PT End of Session - 03/24/18 1913    Visit Number  4    Number of Visits  12    Date for PT Re-Evaluation  04/18/18    Authorization Type  [employer]HC    Authorization - Visit Number  7 ?3 chiropractor visits prior to PT eval    Authorization - Number of Visits  24    PT Start Time  1533    PT Stop Time  1623    PT Time Calculation (min)  50 min    Activity Tolerance  Patient tolerated treatment well;No increased pain    Behavior During Therapy  WFL for tasks assessed/performed       Past Medical History:  Diagnosis Date  . Bell's palsy   . Constipation   . Hypertension   . Murmur   . Parkinsons (HCC)   . Weakness generalized     Past Surgical History:  Procedure Laterality Date  . NO PAST SURGERIES      There were no vitals filed for this visit.  Subjective Assessment - 03/24/18 1537    Subjective  Had rt knee MRI and brought results (pulled up on his phone. Rt patella posterior, inferior level IV chondrosis, mucoid degeneration ACL; small Baker's cyst and effusion posterior knee). He feels his knee pain is what is causing his back pain. His back does feel better and he feels "lighter" when he does his PWR exercises and back exercises. He reports he has not heard from HR rep re: his FMLA and will ask [name] if she will complete. He already has FMLA coverage for his PD, he just wanted to also get coverage for his back pain     Pertinent History  Bell's palsy, HTN, Parkinsons    Diagnostic tests  MRI of the lumbar showed mild degenerative changes, there is no evidence of significant canal or foraminal narrowing    Patient Stated Goals  reduce back and  rt knee pain; improve his walking    Currently in Pain?  Yes    Pain Score  3     Pain Location  Back    Pain Orientation  Right    Pain Descriptors / Indicators  Sore;Nagging    Pain Type  Acute pain    Pain Onset  More than a month ago    Pain Frequency  Intermittent                     Treatment-  There-ex--patient with multiple questions re: results of knee MRI and if there are certain exercises or machines he should avoid at the gym (either due to knee or PD). Educated on not working flexor muscles for strengthening due to PD (should focus more on extensor patterns). He again noted he is concerned re: his hands and incorporated open, extended fingers during PWR! Exercises (and pt now has an order for an OT eval). See also PT instructions.      PWR [facility]) - 03/24/18 1559    PWR! exercises  Moves in supine;Moves in sitting    PWR! Up  x 10     PWR! Rock  [employer]! Twist  x10  PWR! Step  x8    Comments  instructional cues for proper technique (he grossly had the right idea)    PWR! Up  5    PWR! Rock  10    PWR! Twist  6    PWR! Step  6    Comments  seated at EOB except for           PT Education - 03/24/18 1910    Education Details  second copy of supine and seated PWR! exercises with additional tips/comments added to his copy to help him remember how to do the exercises.     Person(s) Educated  Patient    Methods  Explanation    Comprehension  Verbalized understanding       PT Short Term Goals - 03/24/18 1932      PT SHORT TERM GOAL #1   Title  Patient will be independent with basic HEP (Target for all STGs: 03/21/2018--date extended due to multiple missed appts due to work schedule 04/01/18)    Time  4    Period  Weeks    Status  New      PT SHORT TERM GOAL #2   Title  Patient will improve gait velocity to >=2.62 ft/sec to indicate safe community ambulation.    Time  4    Period  Weeks    Status  New      PT SHORT TERM GOAL #3   Title   Patient will report a 30% decrease in his back pain (overall).    Time  4    Period  Weeks    Status  New        PT Long Term Goals - 02/21/18 2005      PT LONG TERM GOAL #1   Title  Patient will be independent with updated HEP and able to verbalize a plan for appropriate community-based exercise upon discharge from PT. (Target for all LTGs: 04/18/2018)    Time  8    Period  Weeks    Status  New    Target Date  04/18/18      PT LONG TERM GOAL #2   Title  Patient will demonstrate gait velocity >=3.0 ft/sec (closer to norm for his age) to indicate a lesser fall risk and lesser back pain.     Time  8    Period  Weeks    Status  New      PT LONG TERM GOAL #3   Title  Patient will report >=50% reduction in back pain     Time  8    Period  Weeks    Status  New      PT LONG TERM GOAL #4   Title  Patient will verbalize knowledge of local resources related to Parkinson's diagnosis (support groups, exercise classes)    Time  8    Period  Weeks    Status  New            Plan - 03/24/18 1915    Clinical Impression Statement  Patient apologizes for missed appointments as he has a Writer that wants him to schedule his appointments at the end of the day. His future appointments should not be a problem. (Of note, this is only pt's 4th visit out of planned 8 visits in 4 weeks, therefore postponed checking STGs to 04/01/18) Patient has felt benefit of HEP and has been doing some of the exercises each day (mixes up what he  does each time). Required instructional cues for correct technique for PWR! supine and sitting exercises, however with excellent carryover of corrections to subsequent reps. Patient can continue to benefit from PT.     Rehab Potential  Fair    Clinical Impairments Affecting Rehab Potential  unknown origin of back and rt knee pain    PT Frequency  Other (comment) 2x/wk for 4 weeks; 1x/week for weeks    PT Duration  8 weeks    PT Treatment/Interventions  ADLs/Self Care  Home Management;Aquatic Therapy;Moist Heat;Electrical Stimulation;Ultrasound;DME Instruction;Gait training;Cognitive remediation;Neuromuscular re-education;Balance training;Therapeutic exercise;Therapeutic activities;Functional mobility training;Patient/family education;Manual techniques;Passive range of motion    PT Next Visit Plan   CHECK STGs; try again supine and sitting PWR! (needs multiple cues); work on ambulation (as back and knee allow);    Consulted and Agree with Plan of Care  Patient       Patient will benefit from skilled therapeutic intervention in order to improve the following deficits and impairments:  Decreased activity tolerance, Decreased cognition, Decreased mobility, Decreased knowledge of use of DME, Decreased range of motion, Decreased strength, Difficulty walking, Impaired flexibility, Postural dysfunction, Pain  Visit Diagnosis: Parkinson's disease (HCC)  Midline low back pain, unspecified chronicity, with sciatica presence unspecified  Other symptoms and signs involving the nervous system     Problem List Patient Active Problem List   Diagnosis Date Noted  . Right lumbar radiculopathy 12/27/2017  . Gastroparesis 07/06/2017  . Orthostatic dizziness 05/12/2016  . Low back pain 05/12/2016  . Chronic constipation 12/17/2015  . Chronic insomnia 12/17/2015  . Left hip pain 12/16/2015  . Parkinson's disease (HCC) 09/11/2015  . HYPERTENSION 11/21/2008  . MURMUR 11/21/2008  . CHEST PAIN, ATYPICAL 11/21/2008    Zena AmosLynn P Tawnya Pujol, PT 03/24/2018, 7:37 PM  [facility] [address] [place], [place], [address] Phone: [phone]   Fax:  [phone]  Name: Josepha PiggCharles L Silva MRN: [id number] Date of Birth: [birth year]

## 2018-03-28 ENCOUNTER — Ambulatory Visit: Payer: 59 | Admitting: Physical Therapy

## 2018-03-31 ENCOUNTER — Ambulatory Visit: Payer: 59 | Attending: Family Medicine | Admitting: Physical Therapy

## 2018-03-31 ENCOUNTER — Ambulatory Visit: Payer: 59 | Admitting: Neurology

## 2018-03-31 ENCOUNTER — Encounter: Payer: Self-pay | Admitting: Physical Therapy

## 2018-03-31 ENCOUNTER — Encounter: Payer: Self-pay | Admitting: Neurology

## 2018-03-31 VITALS — BP 153/98 | HR 59 | Ht 68.5 in | Wt 195.0 lb

## 2018-03-31 DIAGNOSIS — M545 Low back pain: Secondary | ICD-10-CM | POA: Diagnosis present

## 2018-03-31 DIAGNOSIS — F5104 Psychophysiologic insomnia: Secondary | ICD-10-CM | POA: Diagnosis not present

## 2018-03-31 DIAGNOSIS — K5909 Other constipation: Secondary | ICD-10-CM | POA: Diagnosis not present

## 2018-03-31 DIAGNOSIS — R29818 Other symptoms and signs involving the nervous system: Secondary | ICD-10-CM | POA: Diagnosis present

## 2018-03-31 DIAGNOSIS — R262 Difficulty in walking, not elsewhere classified: Secondary | ICD-10-CM | POA: Diagnosis present

## 2018-03-31 DIAGNOSIS — R471 Dysarthria and anarthria: Secondary | ICD-10-CM | POA: Insufficient documentation

## 2018-03-31 DIAGNOSIS — G8929 Other chronic pain: Secondary | ICD-10-CM

## 2018-03-31 DIAGNOSIS — G20 Parkinson's disease: Secondary | ICD-10-CM | POA: Diagnosis not present

## 2018-03-31 NOTE — Therapy (Signed)
[facility] [address] [place], [place], [address] Phone: [phone]   Fax:  [phone]  Physical Therapy Treatment  Patient Details  Name: Ruben Silva MRN: [id number] Date of Birth: 1971-06-13 Referring Provider: Marcial Pacas, MD   Encounter Date: 03/31/2018  PT End of Session - 03/31/18 1420    Visit Number  5    Number of Visits  12    Date for PT Re-Evaluation  04/18/18    Authorization Type  [employer]    Authorization - Visit Number  8 ?3 chiropractor visits prior to PT eval    Authorization - Number of Visits  24    PT Start Time  1410 pt arrived late    PT Stop Time  1445    PT Time Calculation (min)  35 min    Activity Tolerance  Patient tolerated treatment well;No increased pain    Behavior During Therapy  WFL for tasks assessed/performed       Past Medical History:  Diagnosis Date  . Bell's palsy   . Constipation   . Hypertension   . Murmur   . Parkinsons ([place])   . Weakness generalized     Past Surgical History:  Procedure Laterality Date  . NO PAST SURGERIES      There were no vitals filed for this visit.  Subjective Assessment - 03/31/18 1413    Subjective  No changes. Rode 3 miles on recumbent bike yesterday in less than an hour. Back was very sore after.     Pertinent History  Bell's palsy, HTN, Parkinsons    Diagnostic tests  MRI of the lumbar showed mild degenerative changes, there is no evidence of significant canal or foraminal narrowing    Patient Stated Goals  reduce back and rt knee pain; improve his walking    Currently in Pain?  Yes    Pain Score  4     Pain Location  Back    Pain Descriptors / Indicators  Sore    Pain Onset  More than a month ago    Pain Frequency  Intermittent                       [facility] Adult PT Treatment/Exercise - 03/31/18 1432      Ambulation/Gait   Ambulation/Gait Assistance  6: Modified independent (Device/Increase time);5:  Supervision    Ambulation/Gait Assistance Details  vc for heelstrike to promote clearing his toe (tends to catch left toe and causes imbalance); increased arm swing; changes in velocity    Ambulation Distance (Feet)  100 Feet 200, 240, 200    Assistive device  None    Gait Pattern  Step-through pattern;Decreased arm swing - right;Decreased arm swing - left;Decreased step length - right;Decreased step length - left;Decreased weight shift to right;Decreased trunk rotation;Poor foot clearance - left    Ambulation Surface  Indoor    Gait velocity  32.8/15.59=2.10    Pre-Gait Activities  slow exaggerated walking for increased arm swing, exaggerated heel strike; step taps with heel to 1st then 2nd step with big arm swing    Gait Comments  noted imbalance when turning; practiced turning with stepping rt foot first if turning rt and left foot if turning left; 1/4 and 1/2 turns with PT calling out which way to turn with pt choosing proper technique 75% of the time        PWR [facility]) - 03/31/18 1418    PWR! exercises  Moves in supine    PWR! Up  10    PWR! Rock  20    PWR! Twist  10 due to limited time; arrived late    PWR! Step  10    Comments  pt demonstrated proper technique for each exercise (including with open hands)          PT Education - 03/31/18 1454    Education Details  pt is doing well (back pain significantly improved) and ?prepare to d/c from PT soon (esp with 24 VL and OT/SLP evals pending)    Person(s) Educated  Patient    Methods  Explanation    Comprehension  Verbalized understanding       PT Short Term Goals - 03/31/18 1501      PT [place] #1   Title  Patient will be independent with basic HEP (Target for all STGs: 03/21/2018--date extended due to multiple missed appts due to work schedule 04/01/18)    Time  4    Period  Weeks    Status  Achieved      PT SHORT TERM GOAL #2   Title  Patient will improve gait velocity to >=2.62 ft/sec to indicate safe community  ambulation.    Baseline  03/31/18  2.10 ft/sec (measured twice and took the average)    Time  4    Period  Weeks    Status  Not Met      PT SHORT TERM GOAL #3   Title  Patient will report a 30% decrease in his back pain (overall).    Baseline  8/1 pt reported has improved 60%    Time  4    Period  Weeks    Status  Achieved        PT Long Term Goals - 02/21/18 2005      PT LONG TERM GOAL #1   Title  Patient will be independent with updated HEP and able to verbalize a plan for appropriate community-based exercise upon discharge from PT. (Target for all LTGs: 04/18/2018)    Time  8    Period  Weeks    Status  New    Target Date  04/18/18      PT LONG TERM GOAL #2   Title  Patient will demonstrate gait velocity >=3.0 ft/sec (closer to norm for his age) to indicate a lesser fall risk and lesser back pain.     Time  8    Period  Weeks    Status  New      PT LONG TERM GOAL #3   Title  Patient will report >=50% reduction in back pain     Time  8    Period  Weeks    Status  New      PT LONG TERM GOAL #4   Title  Patient will verbalize knowledge of local resources related to Parkinson's diagnosis (support groups, exercise classes)    Time  8    Period  Weeks    Status  New            Plan - 03/31/18 1457    Clinical Impression Statement  Patient has met 2 of 3 STGs (did not meet gait velocity goal, "I just walk slow... it's my swag" Reviewed PWR! supine with pt demonstrating excellent progress with his technique. Additionally focused on pre-gait and gait training to address pt's imbalance due to poor foot clearance and turning technique. Discussed his good progress and  need to be judicious with his visits due to limited visits. Cancelled his next PT appt and plan to see in 2 weeks with probable d/c from PT at that time.     Rehab Potential  Fair    Clinical Impairments Affecting Rehab Potential  unknown origin of back and rt knee pain    PT Frequency  Other (comment) 2x/wk  for 4 weeks; 1x/week for weeks    PT Duration  8 weeks    PT Treatment/Interventions  ADLs/Self Care Home Management;Aquatic Therapy;Moist Heat;Electrical Stimulation;Ultrasound;DME Instruction;Gait training;Cognitive remediation;Neuromuscular re-education;Balance training;Therapeutic exercise;Therapeutic activities;Functional mobility training;Patient/family education;Manual techniques;Passive range of motion    PT Next Visit Plan  review sitting PWR! (needs multiple cues); work on ambulation (as back and knee allow); consider d/c to save visits for OT and SLP    Consulted and Agree with Plan of Care  Patient       Patient will benefit from skilled therapeutic intervention in order to improve the following deficits and impairments:  Decreased activity tolerance, Decreased cognition, Decreased mobility, Decreased knowledge of use of DME, Decreased range of motion, Decreased strength, Difficulty walking, Impaired flexibility, Postural dysfunction, Pain  Visit Diagnosis: Other symptoms and signs involving the nervous system  Difficulty in walking, not elsewhere classified     Problem List Patient Active Problem List   Diagnosis Date Noted  . Right lumbar radiculopathy 12/27/2017  . Gastroparesis 07/06/2017  . Orthostatic dizziness 05/12/2016  . Low back pain 05/12/2016  . Chronic constipation 12/17/2015  . Chronic insomnia 12/17/2015  . Left hip pain 12/16/2015  . Parkinson's disease ([place]) 09/11/2015  . HYPERTENSION 11/21/2008  . MURMUR 11/21/2008  . CHEST PAIN, ATYPICAL 11/21/2008    Rexanne Mano, PT 03/31/2018, 3:03 PM  [facility] [address] [place], [place], [address] Phone: [phone]   Fax:  [phone]  Name: Ruben Silva MRN: [id number] Date of Birth: 1971-06-18

## 2018-03-31 NOTE — Progress Notes (Signed)
Chief Complaint  Patient presents with  . Parkisnon's Disease    He is going to PT three times weekly.  He is still boxing on Mondays.  He is having right leg pain.  He is having more gait difficulty (much slower).  Overall, he is feeling weaker.        PATIENT: Ruben Silva DOB: [birth year]  Chief Complaint  Patient presents with  . Parkisnon's Disease    He is going to PT three times weekly.  He is still boxing on Mondays.  He is having right leg pain.  He is having more gait difficulty (much slower).  Overall, he is feeling weaker.     HISTORICAL  Ruben Silva 47 yo right-handed male, with early-onset idiopathic Parkinson's disease, his primary care physician Dr. Iona Beard, I saw him initially in June 2016  Since October 2015,he was noticed by the family to move at the slower pace, shuffling his gait,gradually getting worse over the past few months, he was also noticed to have right hand shaking, slow to response, decreased facial expression, to the point of needing help to pull up his pants sometimes, he also complains of orthostatic dizziness, difficulty sleeping at nighttime, fatigue, chronic constipation, impotence. 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, he has mild difficulty writing, complains of dizziness, especially with sudden positional change  MRI brain in June 2016 was normal, MRI of cervical spine, mild degenerative disc disease, no significant canal or foraminal stenosis CAT scan of the chest was normal  Laboratory in 2016 showed normal or Negative TSH, ESR, C-reactive protein RPR HIV, B12,CMP CBC, protein electrophoresis, more extensive laboratory evaluations, negative or normal, paraneoplastic panel, ceruloplasmin, copper level, vitamin B1, B12, vitamin D, CA 485, RPR, HIV, folic acid, C-reactive protein, CPK, ESR, protein electrophoresis, Lyme titer, CBC, CMP  He was  seen by [facility] movement specialist Dr. Linus Mako in September 2016, concurred with the diagnosis of early-onset idiopathic Parkinson's disease  He started sinemet since June 2016, gradually titrating dose, which did help his symptoms, he notice wearing off despite quick titrating dose of Sinemet 25/100 mg, 2 tablets 4 times a day, wearing off dyskinesia, he was started on Stalevo 200 mg since February 27 2016 at 6am, 10am, 4pm, 7pm, He goes to bed at 10pm.  But could not tolerate dopamine agonist or Azilect, Azilect-upset stomach, worsening dizziness, Requip-significant GI side effect, dizziness,  He walks regularly 2-4 miles each day.  he was taken to the emergency room in October 2016 after fall, dizziness, repeat CAT scan of the brain in October 2016 was normal  He now complains of constant left shoulder pain  UPDATE May 12 2016: Parkinson's disease: he was started on Stalevo 200 mg since February 27 2016 at 6am, 10am, 4pm, 7pm, He goes to bed at 10pm. He is very happy about current medications, denies significant bradykinesia, able to keep up with his working schedule, complains of orange color discoloration of the urine  Chronic Constipation: Once or twice each week, he took Dulcolax on the weekends,  Insomnia:  He takes clonazepam 1 mg every night works well   Orthostatic dizziness: Worsening with [place] agonist, and Azilect, improved after he stopped medications,  Update June 26 2016: He came here as outlined scheduled urgent visit, woke up in the morning noticed right lateral leg pain, increased gait difficulty because of the pain, continue  have chronic constipation despite Linzess 158m daily treatment  UPDATE Aug 12 2016: He noticed wearing off of his levodopa before the next dose, he is taking Stalevo 50/200/200 at 6 AM,11am, 4pm, 8pm, "hit a brick wall around 11am every morning".   UPDATE November 10 2016: He complains of worsening slow movement, is now taking Stalevo 50-200-200 5  times a day, he denies significant side effect from medications,  He complains worsening constipation despite Linzess 290 mcg daily,  He also complains of significant headaches, has been taking Excedrin Migraine once a day for 2 weeks,  he also complains of orthostatic dizziness, lightheaded when getting up quickly, there was one passing out episodes when he first had up from prolonged sitting.  He sleeps well with clonazepam 2 mg every night.   UPDATE February 11 2017: Difficulty sleeping: He has difficulty sleeping, sometimes 3-4 night in a roll, seroqueal 25 mg the prescription was given in July 2017, if he take 25 mg he can sleep better, but has next morning hang over, he is also taking   clonazepam 1 mg 2 tablets at that time  Constipation, even with Linzess 2912mdaily  I am able to review CT abdomen scan on December 25 2016 marked gastric distention with no underlying cause identified, this could be due to gastroparesis,  I was able to review repeat CT [facility] there was food filled distended stomach and proximal duodenum, EGD is pending on March 02 2017.  His tremor is more prominent, mild peak dose dyskinesia  He is now taking Stalevo 200 at 6, 10, 15, 18, 20, most difficult time is late morning,  UPDATE Jul 06 2017: He is in sublingual apomorphine clinical trial, which has helped his symptoms greatly,  He is now taking Stalevo 200 5 times a day,   He still has chronic constipations, taking  Linzess  290 mg daily. Mestinon 6078mid, Latulose as needed.  Chronic insomnia, he is taking clonazepam 1mg36mtabs qhs.  UPDATE Sep 16 2017: He took Stalevo at 6, 132, 9600, 1730, 2100,  He took night dose, so he can move function in the morning time.  He complains of right leg pain, no low back pain, around right knee, was seen by orthopedic in the past, no significant abnormality found.   He has chronic cough, sore throat, no fever, feel tired.  He  had mold in his house, is going to have his house inspected.   UPDATE December 27 2017: He has mild blurry vision, he has chronic low back pain, going done to his right leg, lateral thigh, to his right foot, he was seen by chiropractor,  He is taking stalevo 5 times a day.  Also involved in research trial at [facility] Dr. SiddDeboraha Spranges sublingual Apomorphine, but only take it few times every few month  UPDATE March 31 2017: He continue have slow worsening of his Parkinson symptoms over the past few months, despite taking Stalevo 205 times a day, and also clonazepam 2 mg at nighttime for insomnia, today's examination is taking about 40 minutes after his last dose of levodopa, he was noted to have significant rigidity, bradykinesia   REVIEW OF SYSTEMS: Full 14 system review of systems performed and notable only for as above.   ALLERGIES: No Known Allergies  HOME MEDICATIONS: Current Outpatient Prescriptions  Medication Sig Dispense Refill  . docusate sodium (COLACE) 100 MG capsule   3  .  ibuprofen (ADVIL,MOTRIN) 200 MG tablet Take 200 mg by mouth every 6 (six) hours as needed.    . polyethylene glycol powder (GLYCOLAX/MIRALAX) powder     . valsartan-hydrochlorothiazide (DIOVAN-HCT) 160-12.5 MG per tablet        PAST MEDICAL HISTORY: Past Medical History  Diagnosis Date  . Murmur   . Hypertension   . Bell's palsy   . Constipation   . Weakness generalized     PAST SURGICAL HISTORY: Past Surgical History:  Procedure Laterality Date  . NO PAST SURGERIES      FAMILY HISTORY: Family History  Problem Relation Age of Onset  . Hypertension Mother   . Liver disease Mother   . Hypercholesterolemia Mother   . Stroke Father   . Hypertension Father   . Diabetes Father     SOCIAL HISTORY:  History   Social History  . Marital Status: Married    Spouse Name: N/A  . Number of Children: 1  . Years of Education: College   Occupational History  . Government social research officer     Social History Main Topics  . Smoking status: Never Smoker   . Smokeless tobacco: Not on file  . Alcohol Use: No  . Drug Use: No  . Sexual Activity: Not on file   Other Topics Concern  . Not on file   Social History Narrative   Lives at home with wife and son.   Right-hand.   Occasional use of caffeine.     PHYSICAL EXAM   Vitals:   03/31/18 1508  BP: (!) 153/98  Pulse: (!) 59  Weight: 195 lb (88.5 kg)  Height: 5' 8.5" (1.74 m)    Not recorded      Body mass index is 29.22 kg/m.  PHYSICAL EXAMNIATION: This exam is performed 4 hours after last dose of Stalevo 200  Gen: NAD, conversant, well nourised, obese, well groomed                     Cardiovascular: Regular rate rhythm, no peripheral edema, warm, nontender. Eyes: Conjunctivae clear without exudates or hemorrhage Neck: Supple, no carotid bruise. Pulmonary: Clear to auscultation bilaterally   NEUROLOGICAL EXAM:  MENTAL STATUS: tired looking middle aged male, mild masked face, Speech:    Speech is normal; fluent and spontaneous with normal comprehension.  Cognition:    The patient is oriented to person, place, and time;     recent and remote memory intact;     language fluent;     normal attention, concentration,     fund of knowledge.  CRANIAL NERVES: CN II: Visual fields are full to confrontation. Fundoscopic exam is normal with sharp discs and no vascular changes. Pupil equal round reactive to light CN III, IV, VI: extraocular movement are normal. No ptosis. CN V: Facial sensation is intact to pinprick in all 3 divisions bilaterally. Corneal responses are intact.  CN VII: Face is symmetric with normal eye closure and smile. CN VIII: Hearing is normal to rubbing fingers CN IX, X: Palate elevates symmetrically. Phonation is normal. CN XI: Head turning and shoulder shrug are intact CN XII: Tongue is midline with normal movements and no atrophy.  MOTOR:  He has moderate bilateral upper and lower  extremity, nuchal rigidity, severe right worse than the left limb bradykinesia.   REFLEXES: Reflexes are 2+ and symmetric at the biceps, triceps, knees, and ankles. Plantar responses are flexor.  SENSORY: Light touch, pinprick, position sense, and vibration sense are intact in  fingers and toes.  COORDINATION: Rapid alternating movements and fine finger movements are intact. There is no dysmetria on finger-to-nose and heel-knee-shin.   GAIT/STANCE: He has mildly decreased bilateral arm swing, decreased stride, mild enblock turning. Difficulty clear his feet from floor   DIAGNOSTIC DATA (LABS, IMAGING, TESTING) - I reviewed patient records, labs, notes, testing and imaging myself where available.  ASSESSMENT AND PLAN  Ruben Silva is a 47 y.o. male    Early onset idiopathic Parkinson's disease  Keep Stalevo to 50/200/200, 5 times a day every 3 hours.  Could not tolerate azilect, dopamine agonist Requip, due to GI side effect, increased orthostatic dizziness   He also see [facility] movement specialist Dr. Linus Mako, in on trial for sublingual apomorphine study.  If he continue to worse with his current treatment may consider [place] treatment   Orthostatic dizziness  Encouraging moderate exercise, increase water intake  Chronic Constipation:   much improved with daily banana  Chronic insomnia  Keep clonazepam 16mlligrams 2 tabs every night   Worsening chronic low back pain, radiating pain to right lower extremity  Personally reviewed MRI of lumbar spine on Dec 31, 2017, mild degenerative changes no significant foraminal canal stenosis     YMarcial Pacas M.D. Ph.D.  [facility] [address] [place] [address]Ph: [phone]: [phone]

## 2018-04-04 ENCOUNTER — Ambulatory Visit: Payer: 59 | Admitting: Physical Therapy

## 2018-04-05 ENCOUNTER — Other Ambulatory Visit: Payer: Self-pay | Admitting: Neurology

## 2018-04-11 ENCOUNTER — Encounter: Payer: Self-pay | Admitting: Physical Therapy

## 2018-04-11 ENCOUNTER — Ambulatory Visit: Payer: 59 | Admitting: Occupational Therapy

## 2018-04-11 ENCOUNTER — Ambulatory Visit: Payer: 59 | Admitting: Physical Therapy

## 2018-04-11 DIAGNOSIS — R262 Difficulty in walking, not elsewhere classified: Secondary | ICD-10-CM

## 2018-04-11 DIAGNOSIS — R29818 Other symptoms and signs involving the nervous system: Secondary | ICD-10-CM

## 2018-04-11 NOTE — Therapy (Signed)
[facility] [address] [place], [place], [address] Phone: [phone]   Fax:  [phone]  Physical Therapy Treatment  Patient Details  Name: Ruben Silva MRN: [id number] Date of Birth: 1970-09-13 Referring Provider: Marcial Pacas, MD   Encounter Date: 04/11/2018  PT End of Session - 04/11/18 2036    Visit Number  6    Number of Visits  12    Date for PT Re-Evaluation  04/29/18   updated due to missed 2 weeks   Authorization Type  U[employer]    Authorization - Visit Number  13   8/01 insurance notes updated & had used 12 visits   Authorization - Number of Visits  24    PT Start Time  1540    PT Stop Time  1620    PT Time Calculation (min)  40 min    Activity Tolerance  Patient tolerated treatment well;No increased pain    Behavior During Therapy  WFL for tasks assessed/performed       Past Medical History:  Diagnosis Date  . Bell's palsy   . Constipation   . Hypertension   . Murmur   . Parkinsons ([place])   . Weakness generalized     Past Surgical History:  Procedure Laterality Date  . NO PAST SURGERIES      There were no vitals filed for this visit.  Subjective Assessment - 04/11/18 1542    Subjective  His mom passed away unexpectedly on 2023-04-25. Ruben Silva was yesterday. He has not exercised since he got the news but came to therapy today because he knew he needed to workout    Pertinent History  Bell's palsy, HTN, Parkinsons    Diagnostic tests  MRI of the lumbar showed mild degenerative changes, there is no evidence of significant canal or foraminal narrowing    Patient Stated Goals  reduce back and rt knee pain; improve his walking    Currently in Pain?  No/denies    Pain Onset  --                       [facility] Adult PT Treatment/Exercise - 04/11/18 0001      Ambulation/Gait   Ambulation/Gait Assistance  5: Supervision;4: Min assist    Ambulation/Gait Assistance Details  vc for left  heelstrike and incr UE swing; assisted UE swing with use of trekking poles and PT walking behind to facilitate arm swing. Noted improved UE swing with vc only after this facilitaiton    Ambulation Distance (Feet)  120 Feet   320, 120   Assistive device  None    Gait Pattern  Step-through pattern;Decreased arm swing - right;Decreased arm swing - left;Decreased step length - right;Decreased step length - left;Decreased weight shift to right;Decreased trunk rotation;Poor foot clearance - left;Decreased dorsiflexion - left;Right flexed knee in stance;Left flexed knee in stance    Ambulation Surface  Indoor      Posture/Postural Control   Posture/Postural Control  Postural limitations    Postural Limitations  Rounded Shoulders;Forward head    Posture Comments  addressed via PWR up        P[facility]) - 04/11/18 2029    PWR! exercises  Moves in standing    PWR! Up  10    PWR! Rock  20    PWR! Twist  20    PWR Step  20    Comments  educated with either walls behind him or counter  in front of him for safety with balance; Twist initially done in corner on airex--no back pain therefore transitioned to against wall with full twist/rotation completed          PT Education - 04/11/18 2035    Education Details  PWR! standing exercises    Person(s) Educated  Patient    Methods  Explanation;Demonstration;Tactile cues;Verbal cues;Handout    Comprehension  Verbalized understanding;Returned demonstration;Verbal cues required;Tactile cues required;Need further instruction       PT Short Term Goals - 03/31/18 1501      PT SHORT TERM GOAL #1   Title  Patient will be independent with basic HEP (Target for all STGs: 03/21/2018--date extended due to multiple missed appts due to work schedule 04/01/18)    Time  4    Period  Weeks    Status  Achieved      PT SHORT TERM GOAL #2   Title  Patient will improve gait velocity to >=2.62 ft/sec to indicate safe community ambulation.    Baseline  03/31/18  2.10  ft/sec (measured twice and took the average)    Time  4    Period  Weeks    Status  Not Met      PT SHORT TERM GOAL #3   Title  Patient will report a 30% decrease in his back pain (overall).    Baseline  8/1 pt reported has improved 60%    Time  4    Period  Weeks    Status  Achieved        PT Long Term Goals - 04/11/18 2051      PT LONG TERM GOAL #1   Title  Patient will be independent with updated HEP and able to verbalize a plan for appropriate community-based exercise upon discharge from PT. (Target for all LTGs: 04/29/2018-updated due to 2 missed weeks due to appt availability)    Time  8    Period  Weeks    Status  On-going      PT LONG TERM GOAL #2   Title  Patient will demonstrate gait velocity >=3.0 ft/sec (closer to norm for his age) to indicate a lesser fall risk and lesser back pain.     Time  8    Period  Weeks    Status  On-going      PT LONG TERM GOAL #3   Title  Patient will report >=50% reduction in back pain     Baseline  met when STGs assessed    Time  8    Period  Weeks    Status  Achieved      PT LONG TERM GOAL #4   Title  Patient will verbalize knowledge of local resources related to Parkinson's diagnosis (support groups, exercise classes)    Time  8    Period  Weeks    Status  New            Plan - 04/11/18 2037    Clinical Impression Statement  Patient progressing well with no reported back pain (and only minimal knee discomfort while wearing his new knee brace on RLE). Progressed to standing PWR! exercises for increased ROM, core strengthening, and balance required. Patient was quick to learn technique for PWR! Up, Twist, and step however needed incr time/cues/assist for balance for PWR! rock. Noted updated insurance information re: visit limit with patient having used 12 PT/chiropractor visits prior to today (now has used 13 of 24 visits). Also noted pt  has 24 visits for OT and 24 for SLP that are separate from PT visits. Updated patient re:  this information and he plans to call [facility] to confirm.     Rehab Potential  Fair    Clinical Impairments Affecting Rehab Potential  unknown origin of back and rt knee pain    PT Frequency  Other (comment)   2x/wk for 4 weeks; 1x/week for 4weeks   PT Duration  8 weeks    PT Treatment/Interventions  ADLs/Self Care Home Management;Aquatic Therapy;Moist Heat;Electrical Stimulation;Ultrasound;DME Instruction;Gait training;Cognitive remediation;Neuromuscular re-education;Balance training;Therapeutic exercise;Therapeutic activities;Functional mobility training;Patient/family education;Manual techniques;Passive range of motion    PT Next Visit Plan  review sitting and standing PWR! (needs multiple cues); work on ambulation (as back and knee allow) with incr heel strike, step length, UE swing    Consulted and Agree with Plan of Care  Patient       Patient will benefit from skilled therapeutic intervention in order to improve the following deficits and impairments:  Decreased activity tolerance, Decreased cognition, Decreased mobility, Decreased knowledge of use of DME, Decreased range of motion, Decreased strength, Difficulty walking, Impaired flexibility, Postural dysfunction, Pain  Visit Diagnosis: Other symptoms and signs involving the nervous system  Difficulty in walking, not elsewhere classified     Problem List Patient Active Problem List   Diagnosis Date Noted  . Right lumbar radiculopathy 12/27/2017  . Gastroparesis 07/06/2017  . Orthostatic dizziness 05/12/2016  . Low back pain 05/12/2016  . Chronic constipation 12/17/2015  . Chronic insomnia 12/17/2015  . Left hip pain 12/16/2015  . Parkinson's disease ([place]) 09/11/2015  . HYPERTENSION 11/21/2008  . MURMUR 11/21/2008  . CHEST PAIN, ATYPICAL 11/21/2008    Rexanne Mano, PT 04/11/2018, 8:55 PM  [facility] [address] [place], [place], [address] Phone: [phone]    Fax:  [phone]  Name: Ruben Silva MRN: [id number] Date of Birth: 03-18-1971

## 2018-04-18 ENCOUNTER — Ambulatory Visit: Payer: 59 | Admitting: Physical Therapy

## 2018-04-22 ENCOUNTER — Ambulatory Visit: Payer: 59

## 2018-04-22 ENCOUNTER — Other Ambulatory Visit: Payer: Self-pay | Admitting: Neurology

## 2018-04-22 DIAGNOSIS — R29818 Other symptoms and signs involving the nervous system: Secondary | ICD-10-CM | POA: Diagnosis not present

## 2018-04-22 DIAGNOSIS — R471 Dysarthria and anarthria: Secondary | ICD-10-CM

## 2018-04-22 NOTE — Patient Instructions (Signed)
    Do 5 loud "ah" twice a day.

## 2018-04-22 NOTE — Therapy (Signed)
[facility] [address] [place], [place], [address] Phone: [phone]   Fax:  [phone]  Speech Language Pathology Evaluation  Patient Details  Name: Ruben Silva MRN: [id number] Date of Birth: 13-Aug-1971 Referring Provider: Dolores Lory   Encounter Date: 04/22/2018  End of Session - 04/22/18 1432    Visit Number  1    Number of Visits  17    Date for SLP Re-Evaluation  07/01/18    SLP Start Time  0809   pt 8 minutes late   SLP Stop Time   0847    SLP Time Calculation (min)  38 min    Activity Tolerance  Patient tolerated treatment well       Past Medical History:  Diagnosis Date  . Bell's palsy   . Constipation   . Hypertension   . Murmur   . Parkinsons ([facility])   . Weakness generalized     Past Surgical History:  Procedure Laterality Date  . NO PAST SURGERIES      There were no vitals filed for this visit.  Subjective Assessment - 04/22/18 0811    Subjective  "I had speech therapy over at [facility]."    Currently in Pain?  Yes    Pain Score  6     Pain Location  Back    Pain Orientation  Lower;Right    Pain Descriptors / Indicators  Sore    Pain Type  Acute pain    Pain Onset  Today    Pain Frequency  Constant    Aggravating Factors   "Slept funny on it"         SLP Evaluation [facility] - 04/22/18 1324      SLP Visit Information   SLP Received On  04/22/18    Referring Provider  Dolores Lory    Onset Date  2016    Medical Diagnosis  Parkinson's Disease      Subjective   Subjective  "People tell me I'm muffled. Everybody always ask me, "huh?"" "I get frustrated at people (not understnading me)."    Patient/Family Stated Goal  "Speak clearer"      General Information   HPI  Pt with previous ST at [facility] - about a year ago. Only one visit.      Balance Screen   Has the patient fallen in the past 6 months  --   currently in PT     Prior Functional Status   Cognitive/Linguistic  Baseline  Within functional limits      Cognition   Overall Cognitive Status  Within Functional Limits for tasks assessed      Auditory Comprehension   Overall Auditory Comprehension  Appears within functional limits for tasks assessed      Verbal Expression   Overall Verbal Expression  Appears within functional limits for tasks assessed      Oral Motor/Sensory Function   Overall Oral Motor/Sensory Function  Impaired    Labial ROM  Within Functional Limits    Labial Strength  Within Functional Limits    Labial Coordination  Reduced    Lingual ROM  --   reduced medial    Lingual Strength  Reduced   more on rt than lt   Lingual Coordination  Reduced    Velum  Within Functional Limits      Motor Speech   Overall Motor Speech  Impaired    Respiration  Impaired    Level of Impairment  Phrase  Phonation  Low vocal intensity   low-mid 60s in mod complex conversation   Articulation  Impaired   reduced amplitude but intelligible   Level of Impairment  Phrase    Intelligibility  Intelligible    Effective Techniques  Increased vocal intensity   incr'd to upper 60s with occasional min A 5 min conversation   Phonation  --   IMMEDIATELY better (mid 60s dB)in conversation after loud/a/   Volume  Soft   loud /a/ average 90dB with 3 attempts at sub-85dB                     SLP Education - 04/22/18 1431    Education Details  necessity of performing loud /a/ and rationale, need 5 reps of loud /a/ BID, 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    Person(s) Educated  Patient    Methods  Explanation;Demonstration;Handout    Comprehension  Verbalized understanding;Returned demonstration;Verbal cues required;Need further instruction       SLP Short Term Goals - 04/22/18 1436      SLP SHORT TERM GOAL #1   Title  Pt will generate loud /a/ with average of low 90s dB over 4 sessions    Time  4    Period  Weeks    Status  New      SLP SHORT TERM GOAL #2    Title  Pt will use speech volume average low 70sdB when responding with sentences 18/20 over 3 sessions    Time  4    Period  Weeks    Status  New      SLP SHORT TERM GOAL #3   Title  pt will use abdominal breathing at rest 80% of the time over two sessions    Time  4    Period  Weeks    Status  New      SLP SHORT TERM GOAL #4   Title  in 5-7 minutes simple conversation pt will generate speech volume of low 70s over 3 sessions    Time  4    Period  Weeks    Status  New       SLP Long Term Goals - 04/22/18 1438      SLP LONG TERM GOAL #1   Title  Pt will generate loud /a/ with average of low 90s dB over 6 total sessions    Time  8    Period  Weeks    Status  New      SLP LONG TERM GOAL #2   Title  pt will use abdominal breathing 75% of the time in 8 minutes mod complex conversation over 3 sessions    Time  8    Period  Weeks    Status  New      SLP LONG TERM GOAL #3   Title  in 8 minutes mod complex conversation pt will generate speech volume of low 70s over 3 sessions    Time  8    Period  Weeks    Status  New       Plan - 04/22/18 1433    Clinical Impression Statement  Pt presents today with lower than normal voice volume average low-mid 60s db in short conversation, which degraded speech intelligibility to 95%-100% in this quiet environment. Pt states he is "constantly" asked to repeat himself on the phone. Conversational intensity improved to mid-upper 60s dB with short conversation tasks with SLP A,  when pt was told to focus on increasing loudness/effort level, suggesting pt is a good candidate for ST. IMMEDIATE improvement was noted in speech volume after pt completed 5 reps of loud /a/ at approx 90dB, however it took pt 3 attempts to reach this loudness level. Pt would benefit from skilled ST focusing on pt's goal of improving commnicative ability with family and in the community.    Speech Therapy Frequency  2x / week    Duration  --   8 weeks or 17 total visits    Treatment/Interventions  SLP instruction and feedback;Compensatory strategies;Patient/family education;Internal/external aids;Compensatory techniques;Environmental controls;Cueing hierarchy;Functional tasks    Potential to Achieve Goals  Good       Patient will benefit from skilled therapeutic intervention in order to improve the following deficits and impairments:   Dysarthria and anarthria    Problem List Patient Active Problem List   Diagnosis Date Noted  . Right lumbar radiculopathy 12/27/2017  . Gastroparesis 07/06/2017  . Orthostatic dizziness 05/12/2016  . Low back pain 05/12/2016  . Chronic constipation 12/17/2015  . Chronic insomnia 12/17/2015  . Left hip pain 12/16/2015  . Parkinson's disease ([facility]) 09/11/2015  . HYPERTENSION 11/21/2008  . MURMUR 11/21/2008  . CHEST PAIN, ATYPICAL 11/21/2008    [facility] SurgeryCHINKE,CARL ,MS, CCC-SLP  04/22/2018, 2:40 PM  [facility] [address] [place], [place], [address] Phone: [phone]   Fax:  [phone]  Name: Ruben Silva MRN: [id number] Date of Birth: August 13, 1971

## 2018-04-25 ENCOUNTER — Ambulatory Visit: Payer: 59 | Admitting: Occupational Therapy

## 2018-04-25 ENCOUNTER — Encounter: Payer: Self-pay | Admitting: Physical Therapy

## 2018-04-25 ENCOUNTER — Ambulatory Visit: Payer: 59 | Admitting: Physical Therapy

## 2018-04-25 ENCOUNTER — Ambulatory Visit: Payer: 59 | Admitting: Speech Pathology

## 2018-04-25 DIAGNOSIS — R471 Dysarthria and anarthria: Secondary | ICD-10-CM

## 2018-04-25 DIAGNOSIS — R262 Difficulty in walking, not elsewhere classified: Secondary | ICD-10-CM

## 2018-04-25 DIAGNOSIS — R29818 Other symptoms and signs involving the nervous system: Secondary | ICD-10-CM

## 2018-04-25 DIAGNOSIS — M545 Low back pain: Secondary | ICD-10-CM

## 2018-04-25 NOTE — Therapy (Signed)
[facility] [address] [place], [place], [address] Phone: [phone]   Fax:  [phone]  Physical Therapy Treatment and Discharge Summary  Patient Details  Name: Ruben Silva MRN: [id number] Date of Birth: 06-20-1971 Referring Provider: Marcial Pacas, MD   Encounter Date: 04/25/2018  PT End of Session - 04/25/18 1634    Visit Number  7    Number of Visits  12    Date for PT Re-Evaluation  04/29/18   updated due to missed 2 weeks   Authorization Type  UHC    Authorization - Visit Number  14   8/01 insurance notes updated & had used 12 visits   Authorization - Number of Visits  24    PT Start Time  6433    PT Stop Time  1602    PT Time Calculation (min)  28 min    Activity Tolerance  Patient tolerated treatment well;No increased pain    Behavior During Therapy  WFL for tasks assessed/performed       Past Medical History:  Diagnosis Date  . Bell's palsy   . Constipation   . Hypertension   . Murmur   . Parkinsons ([place])   . Weakness generalized     Past Surgical History:  Procedure Laterality Date  . NO PAST SURGERIES      There were no vitals filed for this visit.  Subjective Assessment - 04/25/18 1534    Pertinent History  Bell's palsy, HTN, Parkinsons    Diagnostic tests  MRI of the lumbar showed mild degenerative changes, there is no evidence of significant canal or foraminal narrowing    Patient Stated Goals  reduce back and rt knee pain; improve his walking    Currently in Pain?  No/denies                       [facility] Adult PT Treatment/Exercise - 04/25/18 1544      Ambulation/Gait   Ambulation/Gait Assistance  6: Modified independent (Device/Increase time)    Ambulation/Gait Assistance Details  vc to relax arms (holding them stiff and initially out of sync     Ambulation Distance (Feet)  150 Feet    Assistive device  None    Gait Pattern  Step-through pattern;Decreased arm  swing - right;Decreased arm swing - left;Decreased trunk rotation    Gait velocity  32.8/11.12=2.95        PWR [facility]) - 04/25/18 1631    PWR! exercises  Moves in supine    PWR! Up  5    PWR! Rock  10    PWR! Twist  10    PWR! Step  5    Comments  pt demonstrated correct technique      Educated in area resources for Parkinson's (support groups, exercise classes, educational courses, online resources). Provided handout    PT Education - 04/25/18 1632    Education Details  results of LTG assessment; plan to schedule 6 month screen when finish OT or SLP    Person(s) Educated  Patient    Methods  Explanation    Comprehension  Verbalized understanding       PT Short Term Goals - 03/31/18 1501      PT SHORT TERM GOAL #1   Title  Patient will be independent with basic HEP (Target for all STGs: 03/21/2018--date extended due to multiple missed appts due to work schedule 04/01/18)    Time  4  Period  Weeks    Status  Achieved      PT SHORT TERM GOAL #2   Title  Patient will improve gait velocity to >=2.62 ft/sec to indicate safe community ambulation.    Baseline  03/31/18  2.10 ft/sec (measured twice and took the average)    Time  4    Period  Weeks    Status  Not Met      PT SHORT TERM GOAL #3   Title  Patient will report a 30% decrease in his back pain (overall).    Baseline  8/1 pt reported has improved 60%    Time  4    Period  Weeks    Status  Achieved        PT Long Term Goals - 04/25/18 1636      PT LONG TERM GOAL #1   Title  Patient will be independent with updated HEP and able to verbalize a plan for appropriate community-based exercise upon discharge from PT. (Target for all LTGs: 04/29/2018-updated due to 2 missed weeks due to appt availability)    Time  8    Period  Weeks    Status  Achieved      PT LONG TERM GOAL #2   Title  Patient will demonstrate gait velocity >=3.0 ft/sec (closer to norm for his age) to indicate a lesser fall risk and lesser back pain.      Baseline  04/25/18 2.95 ft/sec    Time  8    Period  Weeks    Status  Partially Met      PT LONG TERM GOAL #3   Title  Patient will report >=50% reduction in back pain     Baseline  met when STGs assessed; 04/25/18 reports 90% better    Time  8    Period  Weeks    Status  Achieved      PT LONG TERM GOAL #4   Title  Patient will verbalize knowledge of local resources related to Parkinson's diagnosis (support groups, exercise classes)    Time  8    Period  Weeks    Status  Achieved            Plan - 04/25/18 1644    Clinical Impression Statement  LTGs assessed with pt meeting 3 of 4 goals and partially met the last goal (improved but not to goal level). Patient can verbalize strategies to improve his walking (and demonstrate) however he prefers to walk slowly "with swag." Patient performing PWR exercises correctly and plans to slowly ease back into his exercise routine. Patient now attending orthopedic PT for his rt knee pain and is pleased with his outcomes related to back pain and Parkinson's symptoms. Patient agrees ready for discharge from PT.     Rehab Potential  Fair    Clinical Impairments Affecting Rehab Potential  unknown origin of back and rt knee pain    PT Frequency  Other (comment)   2x/wk for 4 weeks; 1x/week for 4weeks   PT Duration  8 weeks    PT Treatment/Interventions  ADLs/Self Care Home Management;Aquatic Therapy;Moist Heat;Electrical Stimulation;Ultrasound;DME Instruction;Gait training;Cognitive remediation;Neuromuscular re-education;Balance training;Therapeutic exercise;Therapeutic activities;Functional mobility training;Patient/family education;Manual techniques;Passive range of motion    Consulted and Agree with Plan of Care  Patient       Patient will benefit from skilled therapeutic intervention in order to improve the following deficits and impairments:  Decreased activity tolerance, Decreased cognition, Decreased mobility, Decreased knowledge of use  of  DME, Decreased range of motion, Decreased strength, Difficulty walking, Impaired flexibility, Postural dysfunction, Pain  Visit Diagnosis: Other symptoms and signs involving the nervous system  Difficulty in walking, not elsewhere classified  Midline low back pain, unspecified chronicity, with sciatica presence unspecified     Problem List Patient Active Problem List   Diagnosis Date Noted  . Right lumbar radiculopathy 12/27/2017  . Gastroparesis 07/06/2017  . Orthostatic dizziness 05/12/2016  . Low back pain 05/12/2016  . Chronic constipation 12/17/2015  . Chronic insomnia 12/17/2015  . Left hip pain 12/16/2015  . Parkinson's disease ([place]) 09/11/2015  . HYPERTENSION 11/21/2008  . MURMUR 11/21/2008  . CHEST PAIN, ATYPICAL 11/21/2008   PHYSICAL THERAPY DISCHARGE SUMMARY  Visits from Start of Care: 7  Current functional level related to goals / functional outcomes: See LTGs above   Remaining deficits: Decreased coordination with walking and arm swing; gait velocity improved but below normal for age (as expected with Parkinson's)   Education / Equipment: HEP, local resources  Plan: Patient agrees to discharge.  Patient goals were partially met. Patient is being discharged due to being pleased with the current functional level.  ?????          Rexanne Mano, PT 04/25/2018, 4:48 PM  [facility] [address] [place], [place], [address] Phone: [phone]   Fax:  [phone]  Name: JASPAL PULTZ MRN: [id number] Date of Birth: 10/13/1970

## 2018-04-25 NOTE — Therapy (Signed)
[facility] [address] [place], [place], [address] Phone: [phone]   Fax:  [phone]  Speech Language Pathology Treatment  Patient Details  Name: Ruben Silva MRN: [id number] Date of Birth: [birth year] Referring Provider: Dolores LoryYan, Yinjun   Encounter Date: 04/25/2018  End of Session - 04/25/18 1647    Visit Number  2    Number of Visits  17    Date for SLP Re-Evaluation  07/01/18    SLP Start Time  1450    SLP Stop Time   1530    SLP Time Calculation (min)  40 min    Activity Tolerance  Patient tolerated treatment well       Past Medical History:  Diagnosis Date  . Bell's palsy   . Constipation   . Hypertension   . Murmur   . Parkinsons (HCC)   . Weakness generalized     Past Surgical History:  Procedure Laterality Date  . NO PAST SURGERIES      There were no vitals filed for this visit.  Subjective Assessment - 04/25/18 1452    Subjective  "He made me scream and shout."    Patient is accompained by:  Family member    Currently in Pain?  Yes    Pain Score  2     Pain Location  Back    Pain Orientation  Lower;Right    Pain Descriptors / Indicators  Sore    Pain Type  Acute pain    Pain Onset  Today            ADULT SLP TREATMENT - 04/25/18 1450      General Information   Behavior/Cognition  Alert;Cooperative    Patient Positioning  Upright in chair      Treatment Provided   Treatment provided  Cognitive-Linquistic      Pain Assessment   Pain Assessment  No/denies pain      Cognitive-Linquistic Treatment   Treatment focused on  Dysarthria    Skilled Treatment  Pt conversation was sub WNL upon arrival, not registering on SLP's sound level meter. Pt reports practicing loud /a/ 5x twice a day. Initial loud /a/ averaged mid 80s due to fading intensity; SLP demonstrated and with min verbal cues pt maintained average in low 90s, cue x1 to avoid laryngeal push/strain. SLP used pt's  perceived effort level of 8.5 to recalibrate loudness in phrase level task; intensity immediately improved to low 70s dB. Subsequently SLP transitioned into short conversation tasks, with occasional min A required to maintain responses in 67-71dB range. Vocal quality improved significantly with increased effort. Pt reported "trying to talk at 70" at work today. SLP suggested pt place visual reminder at his desk or by the phone as a reminder.      Assessment / Recommendations / Plan   Plan  Continue with current plan of care      Progression Toward Goals   Progression toward goals  Progressing toward goals       SLP Education - 04/25/18 1646    Education Details  effort level; visual reminder to speak louder at work    Star[employer] HotelsPerson(s) Educated  Patient    Methods  Explanation    Comprehension  Verbalized understanding       SLP Short Term Goals - 04/25/18 1650      SLP SHORT TERM GOAL #1   Title  Pt will generate loud /a/ with average of low 90s dB over 4 sessions  Time  4    Period  Weeks    Status  On-going      SLP SHORT TERM GOAL #2   Title  Pt will use speech volume average low 70sdB when responding with sentences 18/20 over 3 sessions    Time  4    Period  Weeks    Status  On-going      SLP SHORT TERM GOAL #3   Title  pt will use abdominal breathing at rest 80% of the time over two sessions    Time  4    Period  Weeks    Status  On-going      SLP SHORT TERM GOAL #4   Title  in 5-7 minutes simple conversation pt will generate speech volume of low 70s over 3 sessions    Time  4    Period  Weeks    Status  On-going       SLP Long Term Goals - 04/25/18 1651      SLP LONG TERM GOAL #1   Title  Pt will generate loud /a/ with average of low 90s dB over 6 total sessions    Time  8    Period  Weeks    Status  On-going      SLP LONG TERM GOAL #2   Title  pt will use abdominal breathing 75% of the time in 8 minutes mod complex conversation over 3 sessions    Time  8     Period  Weeks    Status  On-going      SLP LONG TERM GOAL #3   Title  in 8 minutes mod complex conversation pt will generate speech volume of low 70s over 3 sessions    Time  8    Period  Weeks    Status  On-going       Plan - 04/25/18 1650    Clinical Impression Statement  Pt presents today with lower than normal voice volume average low-mid 60s db in short conversation, which degraded speech intelligibility to 95%-100% in this quiet environment. Pt responsive to intensity-based intervention in phrase level tasks, with carryover to short conversation tasks with occasional min A. Pt would benefit from skilled ST focusing on pt's goal of improving commnicative ability with family and in the community.    Speech Therapy Frequency  2x / week    Treatment/Interventions  SLP instruction and feedback;Compensatory strategies;Patient/family education;Internal/external aids;Compensatory techniques;Environmental controls;Cueing hierarchy;Functional tasks    Potential to Achieve Goals  Good    SLP Home Exercise Plan  loud /a/ and phrase/sentence level tasks     Consulted and Agree with Plan of Care  Patient       Patient will benefit from skilled therapeutic intervention in order to improve the following deficits and impairments:   Dysarthria and anarthria    Problem List Patient Active Problem List   Diagnosis Date Noted  . Right lumbar radiculopathy 12/27/2017  . Gastroparesis 07/06/2017  . Orthostatic dizziness 05/12/2016  . Low back pain 05/12/2016  . Chronic constipation 12/17/2015  . Chronic insomnia 12/17/2015  . Left hip pain 12/16/2015  . Parkinson's disease (HCC) 09/11/2015  . HYPERTENSION 11/21/2008  . MURMUR 11/21/2008  . CHEST PAIN, ATYPICAL 11/21/2008   Rondel Baton, MS, CCC-SLP Speech-Language Pathologist  Arlana Lindau 04/25/2018, 4:51 PM  [facility] [address] [place], [place], [address] Phone:  [phone]   Fax:  [phone]  Name: Ruben Silva MRN: [id number] Date of Birth: Jul 10, 1971

## 2018-04-29 ENCOUNTER — Ambulatory Visit: Payer: 59

## 2018-04-29 ENCOUNTER — Encounter

## 2018-04-29 DIAGNOSIS — R471 Dysarthria and anarthria: Secondary | ICD-10-CM

## 2018-04-29 DIAGNOSIS — R29818 Other symptoms and signs involving the nervous system: Secondary | ICD-10-CM | POA: Diagnosis not present

## 2018-04-29 NOTE — Therapy (Signed)
[facility] [address] [place], [place], [address] Phone: [phone]   Fax:  [phone]  Speech Language Pathology Treatment  Patient Details  Name: Ruben Silva MRN: [id number] Date of Birth: [birth year] Referring Provider: Dolores Lory   Encounter Date: 04/29/2018  End of Session - 04/29/18 1312    Visit Number  3    Number of Visits  17    Date for SLP Re-Evaluation  07/01/18    SLP Start Time  0805    SLP Stop Time   0845    SLP Time Calculation (min)  40 min    Activity Tolerance  Patient tolerated treatment well       Past Medical History:  Diagnosis Date  . Bell's palsy   . Constipation   . Hypertension   . Murmur   . Parkinsons (HCC)   . Weakness generalized     Past Surgical History:  Procedure Laterality Date  . NO PAST SURGERIES      There were no vitals filed for this visit.  Subjective Assessment - 04/29/18 0811    Subjective  "I put a "70" next to my computer."    Currently in Pain?  No/denies            ADULT SLP TREATMENT - 04/29/18 0819      General Information   Behavior/Cognition  Alert;Cooperative;Pleasant mood      Treatment Provided   Treatment provided  Cognitive-Linquistic      Cognitive-Linquistic Treatment   Treatment focused on  Dysarthria    Skilled Treatment  Pt entered ST room with low 70s dB conversation over 8 minutes. Loud /a/ was completed to assist in recalibrating pt's loudness in conversation - average low -mid 90s. SLP targeted 2-4 sentence mod complex linguistic responses to work with pt's speech volume in more linguistic complex utterances and pt maintained WNL loudness (remained in upper 60s low 70sdB) for 14 minutes of task. Simple to mod complex conversation of 7 minutes had average of low 70s dB with rare nonverbal cues for volume.      Assessment / Recommendations / Plan   Plan  Continue with current plan of care      Progression Toward  Goals   Progression toward goals  Progressing toward goals         SLP Short Term Goals - 04/29/18 1313      SLP SHORT TERM GOAL #1   Title  Pt will generate loud /a/ with average of low 90s dB over 4 sessions    Baseline  04-29-18    Time  4    Period  Weeks    Status  On-going      SLP SHORT TERM GOAL #2   Title  Pt will use speech volume average low 70sdB when responding with sentences 18/20 over 3 sessions    Baseline  04-29-18    Time  4    Period  Weeks    Status  On-going      SLP SHORT TERM GOAL #3   Title  pt will use abdominal breathing at rest 80% of the time over two sessions    Time  4    Period  Weeks    Status  On-going      SLP SHORT TERM GOAL #4   Title  in 5-7 minutes simple conversation pt will generate speech volume of low 70s over 3 sessions    Baseline  04-29-18  Time  4    Period  Weeks    Status  On-going       SLP Long Term Goals - 04/29/18 1314      SLP LONG TERM GOAL #1   Title  Pt will generate loud /a/ with average of low 90s dB over 6 total sessions    Baseline  04-29-18    Time  8    Period  Weeks    Status  On-going      SLP LONG TERM GOAL #2   Title  pt will use abdominal breathing 75% of the time in 8 minutes mod complex conversation over 3 sessions    Time  8    Period  Weeks    Status  On-going      SLP LONG TERM GOAL #3   Title  in 8 minutes mod-max complex conversation pt will generate speech volume of low 70s over 3 sessions    Time  8    Period  Weeks    Status  Revised       Plan - 04/29/18 1312    Clinical Impression Statement  Pt presents today with normal voice volume in short conversation,prior to loud /a/. Carryover was seen today to short conversation with rare nonverbal cues. PT remains highly motivated for change. Pt would benefit from cont'd skilled ST focusing on pt's goal of improving commnicative ability with family and in the community.    Speech Therapy Frequency  2x / week    Duration  --   8  weeks/17 sessions   Treatment/Interventions  SLP instruction and feedback;Compensatory strategies;Patient/family education;Internal/external aids;Compensatory techniques;Environmental controls;Cueing hierarchy;Functional tasks    Potential to Achieve Goals  Good    SLP Home Exercise Plan  loud /a/ and phrase/sentence level tasks     Consulted and Agree with Plan of Care  Patient       Patient will benefit from skilled therapeutic intervention in order to improve the following deficits and impairments:   Dysarthria and anarthria    Problem List Patient Active Problem List   Diagnosis Date Noted  . Right lumbar radiculopathy 12/27/2017  . Gastroparesis 07/06/2017  . Orthostatic dizziness 05/12/2016  . Low back pain 05/12/2016  . Chronic constipation 12/17/2015  . Chronic insomnia 12/17/2015  . Left hip pain 12/16/2015  . Parkinson's disease (HCC) 09/11/2015  . HYPERTENSION 11/21/2008  . MURMUR 11/21/2008  . CHEST PAIN, ATYPICAL 11/21/2008    [facility],[place]helbee Apgar ,MS, CCC-SLP  04/29/2018, 1:15 PM   [facility] [address] [place], [place], [address] Phone: [phone]   Fax:  [phone]   Name: Ruben Silva MRN: [id number] Date of Birth: 12/05/1970

## 2018-04-29 NOTE — Patient Instructions (Signed)
  Please complete the assigned speech therapy homework prior to your next session and return it to the speech therapist at your next visit.  

## 2018-05-05 ENCOUNTER — Ambulatory Visit: Payer: 59 | Attending: Family Medicine | Admitting: Occupational Therapy

## 2018-05-05 ENCOUNTER — Ambulatory Visit: Payer: 59 | Admitting: Speech Pathology

## 2018-05-05 DIAGNOSIS — R29818 Other symptoms and signs involving the nervous system: Secondary | ICD-10-CM | POA: Diagnosis present

## 2018-05-05 DIAGNOSIS — R471 Dysarthria and anarthria: Secondary | ICD-10-CM

## 2018-05-05 DIAGNOSIS — R293 Abnormal posture: Secondary | ICD-10-CM | POA: Diagnosis present

## 2018-05-05 DIAGNOSIS — R278 Other lack of coordination: Secondary | ICD-10-CM | POA: Insufficient documentation

## 2018-05-05 DIAGNOSIS — R29898 Other symptoms and signs involving the musculoskeletal system: Secondary | ICD-10-CM

## 2018-05-05 NOTE — Therapy (Signed)
[facility] [address] [place], [place], [address] Phone: [phone]   Fax:  [phone]  Speech Language Pathology Treatment  Patient Details  Name: Ruben Silva MRN: [id number] Date of Birth: September 10, 1970 Referring Provider: Jamey Ripa   Encounter Date: 05/05/2018  End of Session - 05/05/18 1821    Visit Number  4    Number of Visits  17    Date for SLP Re-Evaluation  07/01/18    SLP Start Time  4496    SLP Stop Time   7591    SLP Time Calculation (min)  42 min    Activity Tolerance  Patient tolerated treatment well       Past Medical History:  Diagnosis Date  . Bell's palsy   . Constipation   . Hypertension   . Murmur   . Parkinsons ([place])   . Weakness generalized     Past Surgical History:  Procedure Laterality Date  . NO PAST SURGERIES      There were no vitals filed for this visit.  Subjective Assessment - 05/05/18 1540    Subjective  "It's not a good day."    Patient is accompained by:  Family member   son   Currently in Pain?  Yes    Pain Score  7     Pain Location  Knee    Pain Orientation  Right            ADULT SLP TREATMENT - 05/05/18 1535      General Information   Behavior/Cognition  Alert;Cooperative;Pleasant mood      Treatment Provided   Treatment provided  Cognitive-Linquistic      Pain Assessment   Pain Assessment  No/denies pain      Cognitive-Linquistic Treatment   Treatment focused on  Dysarthria    Skilled Treatment  SLP met pt in gym where he was finishing OT evaluation. OT reported having difficulty hearing pt in noisy gym, and pt agreed he was speaking too softly. In treatment room, SLP recalibrated pt's loudness in conversation using loud /a/, which pt completed with average in low-mid 90s dB at 30 cm. Occasional min A required initially for carryover in sentence level tasks. SLP able to fade support cues to supervision. With increased cognitive load,  loudness fade noted in last several words of utterances. As SLP drew pt's attention to this, he self-monitored and corrected with rare min A. Progressed to simple conversation and description tasks, averaging low to mid 70s with occasional min A to maintain volume in side comments made to SLP and his son.       Assessment / Recommendations / Plan   Plan  Continue with current plan of care      Progression Toward Goals   Progression toward goals  Progressing toward goals         SLP Short Term Goals - 05/05/18 1822      SLP SHORT TERM GOAL #1   Title  Pt will generate loud /a/ with average of low 90s dB over 4 sessions    Baseline  04-29-18, 05-05-18    Time  3    Period  Weeks    Status  On-going      SLP SHORT TERM GOAL #2   Title  Pt will use speech volume average low 70sdB when responding with sentences 18/20 over 3 sessions    Baseline  04-29-18, 05-05-18    Time  3    Period  Weeks    Status  On-going      SLP SHORT TERM GOAL #3   Title  pt will use abdominal breathing at rest 80% of the time over two sessions    Time  3    Period  Weeks    Status  On-going      SLP SHORT TERM GOAL #4   Title  in 5-7 minutes simple conversation pt will generate speech volume of low 70s over 3 sessions    Baseline  04-29-18, 05-05-18    Time  3    Period  Weeks    Status  On-going       SLP Long Term Goals - 05/05/18 1823      SLP LONG TERM GOAL #1   Title  Pt will generate loud /a/ with average of low 90s dB over 6 total sessions    Baseline  04-29-18, 05-05-18    Time  7    Period  Weeks    Status  On-going      SLP LONG TERM GOAL #2   Title  pt will use abdominal breathing 75% of the time in 8 minutes mod complex conversation over 3 sessions    Time  7    Period  Weeks    Status  On-going      SLP LONG TERM GOAL #3   Title  in 8 minutes mod-max complex conversation pt will generate speech volume of low 70s over 3 sessions    Time  7    Period  Weeks    Status  On-going        Plan - 05/05/18 1821    Clinical Impression Statement  Pt presents today with sub-WNL vocal intensity prior to loud /a/. Carryover was seen today to short conversation with rare nonverbal cues. PT remains highly motivated for change. Pt would benefit from cont'd skilled ST focusing on pt's goal of improving communicative ability with family and in the community.    Speech Therapy Frequency  2x / week    Treatment/Interventions  SLP instruction and feedback;Compensatory strategies;Patient/family education;Internal/external aids;Compensatory techniques;Environmental controls;Cueing hierarchy;Functional tasks    Potential to Achieve Goals  Good    SLP Home Exercise Plan  loud /a/ and phrase/sentence level tasks     Consulted and Agree with Plan of Care  Patient       Patient will benefit from skilled therapeutic intervention in order to improve the following deficits and impairments:   Dysarthria and anarthria    Problem List Patient Active Problem List   Diagnosis Date Noted  . Right lumbar radiculopathy 12/27/2017  . Gastroparesis 07/06/2017  . Orthostatic dizziness 05/12/2016  . Low back pain 05/12/2016  . Chronic constipation 12/17/2015  . Chronic insomnia 12/17/2015  . Left hip pain 12/16/2015  . Parkinson's disease ([place]) 09/11/2015  . HYPERTENSION 11/21/2008  . MURMUR 11/21/2008  . CHEST PAIN, ATYPICAL 11/21/2008   Deneise Lever, [place], CCC-SLP Speech-Language Pathologist  Aliene Altes 05/05/2018, 6:24 PM  [facility] [address] [place], [place], [address] Phone: [phone]   Fax:  [phone]   Name: Ruben Silva MRN: [id number] Date of Birth: 29-Sep-1970

## 2018-05-05 NOTE — Therapy (Signed)
[facility] [address] [place], [place], [address] Phone: [phone]   Fax:  [phone]  Occupational Therapy Evaluation  Patient Details  Name: Ruben Silva MRN: [id number] Date of Birth: [birth year] Referring Provider: Levert Feinstein   Encounter Date: 05/05/2018  OT End of Session - 05/05/18 1610    Visit Number  1    Number of Visits  17    Date for OT Re-Evaluation  07/05/18    Authorization Type  [employer]     Authorization - Visit Number  1    Authorization - Number of Visits  24    Activity Tolerance  Patient tolerated treatment well    Behavior During Therapy  [facility] for tasks assessed/performed       Past Medical History:  Diagnosis Date  . Bell's palsy   . Constipation   . Hypertension   . Murmur   . Parkinsons (HCC)   . Weakness generalized     Past Surgical History:  Procedure Laterality Date  . NO PAST SURGERIES      There were no vitals filed for this visit.  Subjective Assessment - 05/05/18 1452    Pertinent History  PD, heart murmur, HTN    Currently in Pain?  Yes    Pain Score  7     Pain Location  Knee    Pain Orientation  Right    Pain Descriptors / Indicators  Aching    Pain Type  Acute pain    Pain Radiating Towards  Posterior knee    Pain Onset  More than a month ago    Pain Frequency  Constant    Aggravating Factors   too much standing on it    Pain Relieving Factors  topical rub        [facility] OT Assessment - 05/05/18 0001      Assessment   Medical Diagnosis  Parkinson's Disease    Referring Provider  Levert Feinstein    Onset Date/Surgical Date  --   PD diagnosed June 2016   Hand Dominance  Right    Prior Therapy  Just finished outpatient neuro PT, currently seeing [facility]      Precautions   Precautions  Fall      Restrictions   Weight Bearing Restrictions  No      Balance Screen   Has the patient fallen in the past 6 months  No    Has the patient had a decrease  in activity level because of a fear of falling?   No    Is the patient reluctant to leave their home because of a fear of falling?   No      Home  Environment   Additional Comments  Pt lives in 2 story home w/ 2 steps to enter. Bedroom on 2nd floor    Lives With  Alone   Son lives there but always gone     Prior Function   Level of Independence  Independent    Vocation  Full time employment    Vocation Requirements  project manager--lots of sitting    Leisure  boxing at [employer]; cycling (does this on his own, not the class)      ADL   Eating/Feeding  Modified independent   difficulty cutting food   Grooming  --   difficulty shaving and beginning difficulty brushing teeth   Upper Body Bathing  Modified independent   increased time   Lower  Body Bathing  Increased time    Upper Body Dressing  Increased time   difficulty tucking in shirt   Lower Body Dressing  Independent    Toilet Transfer  Independent    Toileting -  Hygiene  Increase time   difficulty after bowel movement   Tub/Shower Transfer  Independent      IADL   Shopping  Takes care of all shopping needs independently    Light Housekeeping  Performs light daily tasks such as dishwashing, bed making;Does personal laundry completely    Meal Prep  Plans, prepares and serves adequate meals independently   Eats out a lot however   Education officer, environmental own vehicle    Medication Management  Is responsible for taking medication in correct dosages at correct time   excepts sometimes forgets on weekends   Physicist, medical financial matters independently (budgets, writes checks, pays rent, bills goes to bank), collects and keeps track of income      Mobility   Mobility Status  Independent      Written Expression   Dominant Hand  Right    Handwriting  Severe micrographia;90% legible      Vision - History   Additional Comments  denies change      Cognition   Overall Cognitive Status  Cognition  to be further assessed in functional context PRN      Observation/Other Assessments   Observations  low voice, pt reports symptoms worse in the afternoons, taking senimet 5x/day    Other Surveys   Select    Physical Performance Test    Yes    Simulated Eating Time (seconds)  9.66 sec    Donning Doffing Jacket Time (seconds)  10.81 sec    Donning Doffing Jacket Comments  3 button/unbutton test = 31.29 sec      Posture/Postural Control   Posture/Postural Control  Postural limitations    Postural Limitations  Rounded Shoulders;Forward head      Sensation   Additional Comments  denies change      Coordination   9 Hole Peg Test  Right;Left    Right 9 Hole Peg Test  22.90 sec    Left 9 Hole Peg Test  32.37 sec    Tremors  reports rarely only when nervous      AROM   Overall AROM Comments  BUE AROM WFL's but extra effort for elbow extension and wrist extension                        OT Short Term Goals - 05/05/18 1631      OT SHORT TERM GOAL #1   Title  Independent with PD specific HEP - 06/04/18    Time  4    Period  Weeks    Status  New      OT SHORT TERM GOAL #2   Title  Pt will verbalize understanding of ways to prevent future complications and verbalize understanding of appropriate community resources related to PD    Time  4    Period  Weeks    Status  New      OT SHORT TERM GOAL #3   Title  Pt will improve LUE function as evidenced by increasing Box & Blocks to 48 blocks    Baseline  45    Time  4    Period  Weeks    Status  New      OT SHORT  TERM GOAL #4   Title  Pt will verbalize understanding with writing strategies to help maintain size and legibility    Time  4    Period  Weeks    Status  New        OT Long Term Goals - 05/05/18 1635      OT LONG TERM GOAL #1   Title  Pt will verbalize understanding of adaptive strategies for ADLS/IADLS - 07/05/18    Time  8    Period  Weeks    Status  New      OT LONG TERM GOAL #2   Title  Pt  will improve Lt hand function as evidenced by performing 9 hole peg test in 28 sec. or under    Baseline  eval = 32.37 sec (Rt = 22.90 sec)    Time  8    Period  Weeks    Status  New      OT LONG TERM GOAL #3   Title  Pt will button/unbutton 3 buttons in 28 sec. or less    Baseline  eval = 31.29 sec    Time  8    Period  Weeks    Status  New      OT LONG TERM GOAL #4   Title  Pt will demo ability to write 3 sentences with 90% legibility and no significant decrease in letter size    Time  8    Period  Weeks    Status  New      OT LONG TERM GOAL #5   Title  Pt reports greater ease with shaving and brushing teeth RUE    Time  8    Period  Weeks    Status  New            Plan - 05/05/18 1611    Clinical Impression Statement  Pt is a 47 y.o. male who presents to outpatient O.T. for evaluation. Pt diagnosed with Parkinson's disease in 2016 and presents today with decreased coordination, increased bradykinesia and mild rigidity as well as severe micrographia (pt reports he always wrote small, but worse w/ PD). Pt would benefit from O.T. to address these deficits, develop PD specific HEP, increase ease w/ ADLS and functional tasks, and prevent future complications for improved quality of life.     Occupational Profile and client history currently impacting functional performance  PD since 2016, HTN, heart murmur    Occupational performance deficits (Please refer to evaluation for details):  ADL's;IADL's;Work;Leisure;Social Participation    Rehab Potential  Good    OT Frequency  2x / week    OT Duration  8 weeks   anticipate only 4-6 weeks needed   OT Treatment/Interventions  Self-care/ADL training;DME and/or AE instruction;Therapeutic activities;Therapeutic exercise;Cognitive remediation/compensation;Coping strategies training;Neuromuscular education;Passive range of motion;Visual/perceptual remediation/compensation;Manual Therapy;Patient/family education    Plan  PWR! Hands, review  PWR! Positions already provided from P.T. (following session: coordination HEP, and writing strategies)    Clinical Decision Making  Several treatment options, min-mod task modification necessary    Consulted and Agree with Plan of Care  Patient       Patient will benefit from skilled therapeutic intervention in order to improve the following deficits and impairments:  Decreased coordination, Decreased range of motion, Impaired flexibility, Impaired tone, Improper spinal/pelvic alignment, Impaired UE functional use, Decreased knowledge of use of DME, Decreased mobility, Decreased strength  Visit Diagnosis: Other symptoms and signs involving the musculoskeletal system - Plan: Ot  plan of care cert/re-cert  Other symptoms and signs involving the nervous system - Plan: Ot plan of care cert/re-cert  Other lack of coordination - Plan: Ot plan of care cert/re-cert  Abnormal posture - Plan: Ot plan of care cert/re-cert    Problem List Patient Active Problem List   Diagnosis Date Noted  . Right lumbar radiculopathy 12/27/2017  . Gastroparesis 07/06/2017  . Orthostatic dizziness 05/12/2016  . Low back pain 05/12/2016  . Chronic constipation 12/17/2015  . Chronic insomnia 12/17/2015  . Left hip pain 12/16/2015  . Parkinson's disease (HCC) 09/11/2015  . HYPERTENSION 11/21/2008  . MURMUR 11/21/2008  . CHEST PAIN, ATYPICAL 11/21/2008    Kelli Churn, OTR/L 05/05/2018, 4:41 PM  [facility] [address] [place], [place], [address] Phone: [phone]   Fax:  [phone]  Name: LEWIE DEMAN MRN: [id number] Date of Birth: 1970-09-13

## 2018-05-16 ENCOUNTER — Encounter: Payer: 59 | Admitting: Occupational Therapy

## 2018-05-16 ENCOUNTER — Ambulatory Visit: Payer: 59 | Admitting: Speech Pathology

## 2018-05-16 DIAGNOSIS — R471 Dysarthria and anarthria: Secondary | ICD-10-CM

## 2018-05-16 DIAGNOSIS — R29898 Other symptoms and signs involving the musculoskeletal system: Secondary | ICD-10-CM | POA: Diagnosis not present

## 2018-05-16 NOTE — Therapy (Signed)
[facility] [address] [place], [place], [address] Phone: [phone]   Fax:  [phone]  Speech Language Pathology Treatment  Patient Details  Name: Ruben Silva MRN: [id number] Date of Birth: Sep 25, 1970 Referring Provider: Dolores LoryYan, Yinjun   Encounter Date: 05/16/2018  End of Session - 05/16/18 1711    Visit Number  5    Number of Visits  17    Date for SLP Re-Evaluation  07/01/18    SLP Start Time  1532    SLP Stop Time   1616    SLP Time Calculation (min)  44 min    Activity Tolerance  Patient tolerated treatment well       Past Medical History:  Diagnosis Date  . Bell's palsy   . Constipation   . Hypertension   . Murmur   . Parkinsons (HCC)   . Weakness generalized     Past Surgical History:  Procedure Laterality Date  . NO PAST SURGERIES      There were no vitals filed for this visit.  Subjective Assessment - 05/16/18 1537    Subjective  "I'm pretty loud aren't I?"    Currently in Pain?  No/denies            ADULT SLP TREATMENT - 05/16/18 1534      General Information   Behavior/Cognition  Alert;Cooperative;Pleasant mood      Treatment Provided   Treatment provided  Cognitive-Linquistic      Cognitive-Linquistic Treatment   Treatment focused on  Dysarthria    Skilled Treatment  Pt greeted SLP using a loud voice with noted usage of strategies. In treatment room, SLP recalibrated pt's loudness in conversation using loud /a/, which pt completed with average in low-mid 90s dB at 30 cm. Sentence level responses averaging low to mid 70s with min A to maintain volume in side comments. Progressing to conversation pt required rare cues to increase intensity with side comments. Pt maintained volume within functional limits with description tasks with moderate cognitive load in a noisy environment. Pt accepted a phone call from his son, with notable decrease in vocal intensity. Despite visual  cues Pt did not make adjustment to volume.      Assessment / Recommendations / Plan   Plan  Continue with current plan of care      Progression Toward Goals   Progression toward goals  Progressing toward goals         SLP Short Term Goals - 05/16/18 1540      SLP SHORT TERM GOAL #1   Title  Pt will generate loud /a/ with average of low 90s dB over 4 sessions    Baseline  04-29-18, 05-05-18, 05/16/18    Time  2    Period  Weeks    Status  On-going      SLP SHORT TERM GOAL #2   Title  Pt will use speech volume average low 70sdB when responding with sentences 18/20 over 3 sessions    Baseline  04-29-18, 05-05-18, 05/16/18    Time  2    Period  Weeks    Status  Achieved      SLP SHORT TERM GOAL #3   Title  pt will use abdominal breathing at rest 80% of the time over two sessions    Time  2    Period  Weeks    Status  On-going      SLP SHORT TERM GOAL #4   Title  in 5-7 minutes simple conversation pt will generate speech volume of low 70s over 3 sessions    Baseline  04-29-18, 05-05-18, 05/16/18    Time  2    Period  Weeks    Status  On-going       SLP Long Term Goals - 05/16/18 1554      SLP LONG TERM GOAL #1   Title  Pt will generate loud /a/ with average of low 90s dB over 6 total sessions    Baseline  04-29-18, 05-05-18    Time  6    Period  Weeks    Status  On-going      SLP LONG TERM GOAL #2   Title  pt will use abdominal breathing 75% of the time in 8 minutes mod complex conversation over 3 sessions    Time  6    Period  Weeks    Status  On-going      SLP LONG TERM GOAL #3   Title  in 8 minutes mod-max complex conversation pt will generate speech volume of low 70s over 3 sessions    Time  6    Period  Weeks    Status  On-going       Plan - 05/16/18 1712    Clinical Impression Statement  Pt presents today with sub-WNL vocal intensity prior to loud /a/. Carryover was seen today to short conversation with rare nonverbal cues. PT remains highly motivated for change.  Pt would benefit from cont'd skilled ST focusing on pt's goal of improving communicative ability with family and in the community.    Speech Therapy Frequency  2x / week    Duration  --   8 weeks or 17 visits   Treatment/Interventions  SLP instruction and feedback;Compensatory strategies;Patient/family education;Internal/external aids;Compensatory techniques;Environmental controls;Cueing hierarchy;Functional tasks    Potential to Achieve Goals  Good    SLP Home Exercise Plan  loud /a/ and phrase/sentence level tasks     Consulted and Agree with Plan of Care  Patient       Patient will benefit from skilled therapeutic intervention in order to improve the following deficits and impairments:   Dysarthria and anarthria    Problem List Patient Active Problem List   Diagnosis Date Noted  . Right lumbar radiculopathy 12/27/2017  . Gastroparesis 07/06/2017  . Orthostatic dizziness 05/12/2016  . Low back pain 05/12/2016  . Chronic constipation 12/17/2015  . Chronic insomnia 12/17/2015  . Left hip pain 12/16/2015  . Parkinson's disease (HCC) 09/11/2015  . HYPERTENSION 11/21/2008  . MURMUR 11/21/2008  . CHEST PAIN, ATYPICAL 11/21/2008   Rondel Baton, MS, CCC-SLP Speech-Language Pathologist  Arlana Lindau 05/16/2018, 5:16 PM  [facility] [address] [place], [place], [address] Phone: [phone]   Fax:  [phone]   Name: Ruben Silva MRN: [id number] Date of Birth: 08-12-1971

## 2018-05-19 ENCOUNTER — Ambulatory Visit: Payer: 59 | Admitting: Speech Pathology

## 2018-05-19 ENCOUNTER — Ambulatory Visit: Payer: 59 | Admitting: Occupational Therapy

## 2018-05-24 ENCOUNTER — Ambulatory Visit: Payer: 59

## 2018-05-24 ENCOUNTER — Ambulatory Visit: Payer: 59 | Admitting: Occupational Therapy

## 2018-05-26 ENCOUNTER — Ambulatory Visit: Payer: 59 | Admitting: Occupational Therapy

## 2018-05-26 ENCOUNTER — Encounter: Payer: 59 | Admitting: Occupational Therapy

## 2018-05-26 ENCOUNTER — Ambulatory Visit: Payer: 59

## 2018-05-30 ENCOUNTER — Encounter: Payer: 59 | Admitting: Occupational Therapy

## 2018-05-30 ENCOUNTER — Ambulatory Visit: Payer: 59

## 2018-05-30 DIAGNOSIS — R471 Dysarthria and anarthria: Secondary | ICD-10-CM

## 2018-05-30 DIAGNOSIS — R29898 Other symptoms and signs involving the musculoskeletal system: Secondary | ICD-10-CM | POA: Diagnosis not present

## 2018-05-30 NOTE — Patient Instructions (Signed)
  Please complete the assigned speech therapy homework prior to your next session and return it to the speech therapist at your next visit.  

## 2018-05-30 NOTE — Therapy (Signed)
[facility] [address] [place], [place], [address] Phone: [phone]   Fax:  [phone]  Speech Language Pathology Treatment  Patient Details  Name: Ruben Silva MRN: [id number] Date of Birth: [birth year] Referring Provider (SLP): Dolores Lory   Encounter Date: 05/30/2018  End of Session - 05/30/18 1634    Visit Number  6    Number of Visits  17    Date for SLP Re-Evaluation  07/01/18    SLP Start Time  1535    SLP Stop Time   1621    SLP Time Calculation (min)  46 min    Activity Tolerance  Patient tolerated treatment well       Past Medical History:  Diagnosis Date  . Bell's palsy   . Constipation   . Hypertension   . Murmur   . Parkinsons (HCC)   . Weakness generalized     Past Surgical History:  Procedure Laterality Date  . NO PAST SURGERIES      There were no vitals filed for this visit.  Subjective Assessment - 05/30/18 1539    Subjective  "I need some water so I can be loud."    Currently in Pain?  No/denies            ADULT SLP TREATMENT - 05/30/18 1550      General Information   Behavior/Cognition  Alert;Cooperative;Pleasant mood      Treatment Provided   Treatment provided  Cognitive-Linquistic      Cognitive-Linquistic Treatment   Treatment focused on  Dysarthria    Skilled Treatment  SLP used loud /a/ to recalibrate pt's conversational speech volume. Pt consistently began with mid-upper 80s dB and within 3-4 seconds would fade to low 80s dB. With visual assistance SLP provided pt cues for staying consistently loud over the entirety of the /a/. This improved pt's production slightly and he would not decay until 5-6 seconds. SLP provided further visual cues and pt had better success with maintaining loudness over time. SLP and pt played a game requiring pt to keep WNL folume over 1-2 sentences and pt's loudness immediately improved to WNL over 8 turns back and forth. Comments  between turns were sub-WNL. Pt then answered "wh" questions and pt remained at WNL volume throughout this task. SLP encouraged pt to use this level of effort whenever talking.       Assessment / Recommendations / Plan   Plan  Continue with current plan of care      Progression Toward Goals   Progression toward goals  Progressing toward goals       SLP Education - 05/30/18 1634    Education Details  speech heirarchy for mastery of louder speech    Person(s) Educated  Patient    Methods  Explanation    Comprehension  Verbalized understanding       SLP Short Term Goals - 05/30/18 1635      SLP SHORT TERM GOAL #1   Title  Pt will generate loud /a/ with average of low 90s dB over 4 sessions    Baseline  04-29-18, 05-05-18, 05/16/18    Time  1    Period  Weeks    Status  On-going      SLP SHORT TERM GOAL #2   Title  Pt will use speech volume average low 70sdB when responding with sentences 18/20 over 3 sessions    Status  Achieved      SLP SHORT TERM  GOAL #3   Title  pt will use abdominal breathing at rest 80% of the time over two sessions    Time  1    Period  Weeks    Status  On-going      SLP SHORT TERM GOAL #4   Title  in 5-7 minutes simple conversation pt will generate speech volume of low 70s over 3 sessions    Status  Achieved       SLP Long Term Goals - 05/30/18 1636      SLP LONG TERM GOAL #1   Title  Pt will generate loud /a/ with average of low 90s dB over 6 total sessions    Baseline  04-29-18, 05-05-18    Time  5    Period  Weeks    Status  On-going      SLP LONG TERM GOAL #2   Title  pt will use abdominal breathing 75% of the time in 8 minutes mod complex conversation over 3 sessions    Time  5    Period  Weeks    Status  On-going      SLP LONG TERM GOAL #3   Title  in 8 minutes mod-max complex conversation pt will generate speech volume of low 70s over 3 sessions    Time  5    Period  Weeks    Status  On-going       Plan - 05/30/18 1634    Clinical  Impression Statement  Pt presented again today with sub-WNL vocal intensity prior to loud /a/. Carryover was seen today to short conversation only after practice at sentence level for 15-17 minutes. Pt remains highly motivated for change and would cont to benefit from cont'd skilled ST focusing on pt's goal of improving communicative ability with family and in the community.    Speech Therapy Frequency  2x / week    Duration  --   8 weeks or 17 visits   Treatment/Interventions  SLP instruction and feedback;Compensatory strategies;Patient/family education;Internal/external aids;Compensatory techniques;Environmental controls;Cueing hierarchy;Functional tasks    Potential to Achieve Goals  Good    SLP Home Exercise Plan  loud /a/ and phrase/sentence level tasks     Consulted and Agree with Plan of Care  Patient       Patient will benefit from skilled therapeutic intervention in order to improve the following deficits and impairments:   Dysarthria and anarthria    Problem List Patient Active Problem List   Diagnosis Date Noted  . Right lumbar radiculopathy 12/27/2017  . Gastroparesis 07/06/2017  . Orthostatic dizziness 05/12/2016  . Low back pain 05/12/2016  . Chronic constipation 12/17/2015  . Chronic insomnia 12/17/2015  . Left hip pain 12/16/2015  . Parkinson's disease (HCC) 09/11/2015  . HYPERTENSION 11/21/2008  . MURMUR 11/21/2008  . CHEST PAIN, ATYPICAL 11/21/2008    [facility] ,MS, CCC-SLP.  05/30/2018, 4:37 PM  [facility] [address] [place], [place], [address] Phone: [phone]   Fax:  [phone]   Name: Ruben Silva MRN: [id number] Date of Birth: [birth year]

## 2018-06-01 ENCOUNTER — Ambulatory Visit: Payer: 59 | Admitting: Occupational Therapy

## 2018-06-01 ENCOUNTER — Ambulatory Visit: Payer: 59 | Attending: Family Medicine

## 2018-06-01 DIAGNOSIS — R29818 Other symptoms and signs involving the nervous system: Secondary | ICD-10-CM

## 2018-06-01 DIAGNOSIS — R278 Other lack of coordination: Secondary | ICD-10-CM

## 2018-06-01 DIAGNOSIS — R293 Abnormal posture: Secondary | ICD-10-CM | POA: Insufficient documentation

## 2018-06-01 DIAGNOSIS — R29898 Other symptoms and signs involving the musculoskeletal system: Secondary | ICD-10-CM | POA: Insufficient documentation

## 2018-06-01 DIAGNOSIS — R471 Dysarthria and anarthria: Secondary | ICD-10-CM | POA: Diagnosis not present

## 2018-06-01 NOTE — Therapy (Signed)
[facility] [address] [place], [place], [address] Phone: [phone]   Fax:  [phone]  Speech Language Pathology Treatment  Patient Details  Name: Ruben Silva MRN: [id number] Date of Birth: 10-30-1970 Referring Provider (SLP): Jamey Ripa   Encounter Date: 06/01/2018  End of Session - 06/01/18 1638    Visit Number  7    Number of Visits  17    Date for SLP Re-Evaluation  07/01/18    SLP Start Time  1533    SLP Stop Time   1615    SLP Time Calculation (min)  42 min    Activity Tolerance  Patient tolerated treatment well       Past Medical History:  Diagnosis Date  . Bell's palsy   . Constipation   . Hypertension   . Murmur   . Parkinsons ([place])   . Weakness generalized     Past Surgical History:  Procedure Laterality Date  . NO PAST SURGERIES      There were no vitals filed for this visit.  Subjective Assessment - 06/01/18 1537    Subjective  "I had 8 appointments this week. Can we go down to once a week."    Currently in Pain?  Yes    Pain Score  2     Pain Location  Knee    Pain Orientation  Right    Pain Descriptors / Indicators  Aching    Pain Type  Acute pain    Pain Onset  More than a month ago    Pain Frequency  Intermittent    Aggravating Factors   walking    Pain Relieving Factors  knee brace            ADULT SLP TREATMENT - 06/01/18 1539      General Information   Behavior/Cognition  Alert;Cooperative;Pleasant mood      Treatment Provided   Treatment provided  Cognitive-Linquistic      Cognitive-Linquistic Treatment   Treatment focused on  Dysarthria    Skilled Treatment  Pt with decr'd working memory noted with tasks today in 5 item oddman out exercise. Pt spontaneously used paper/pencil for compensating. Loud /a/ at average low 90s dB. With phrase responses, pt responded with volume in low 70s dB, however when cognitive load incr'd his volume was lower in average,  more near 70dB. Pt often impulsive with providing answers - ? impulsivity vs. personality.      Assessment / Recommendations / Plan   Plan  Continue with current plan of care      Progression Toward Goals   Progression toward goals  Progressing toward goals         SLP Short Term Goals - 06/01/18 1641      SLP SHORT TERM GOAL #1   Title  Pt will generate loud /a/ with average of low 90s dB over 4 sessions    Period  Weeks    Status  Achieved      SLP SHORT TERM GOAL #2   Title  Pt will use speech volume average low 70sdB when responding with sentences 18/20 over 3 sessions    Status  Achieved      SLP SHORT TERM GOAL #3   Title  pt will use abdominal breathing at rest 80% of the time over two sessions    Status  Partially Met   one session     SLP SHORT TERM GOAL #4   Title  in 5-7 minutes simple conversation pt will generate speech volume of low 70s over 3 sessions    Status  Achieved       SLP Long Term Goals - 06/01/18 1642      SLP LONG TERM GOAL #1   Title  Pt will generate loud /a/ with average of low 90s dB over 6 total sessions    Baseline  04-29-18, 05-05-18, 06-01-18    Time  5    Period  Weeks    Status  On-going      SLP LONG TERM GOAL #2   Title  pt will use abdominal breathing 75% of the time in 8 minutes mod complex conversation over 3 sessions    Time  5    Period  Weeks    Status  On-going      SLP LONG TERM GOAL #3   Title  in 8 minutes mod-max complex conversation pt will generate speech volume of low 70s over 3 sessions    Time  5    Period  Weeks    Status  On-going      SLP LONG TERM GOAL #4   Title  pt will tell SLP two websites to visit to learn more information about Parkinson's disease    Time  5    Status  New       Plan - 06/01/18 1639    Clinical Impression Statement  Pt presented today with louder vocal intensity than yesterday, prior to loud /a/. Carryover was seen today in short conversation bfore and after strucutred tasks.  SLP ?s impulsivity ansewring questions vs. personality, and ? decr'd insight into deficits vs. decr'd education about Parkinson's. Will add goal for PD education. Pt remains highly motivated for change and would cont to benefit from cont'd skilled ST focusing on pt's goal of improving communicative ability with family and in the community.    Speech Therapy Frequency  2x / week    Duration  --   8 weeks or 17 visits   Treatment/Interventions  SLP instruction and feedback;Compensatory strategies;Patient/family education;Internal/external aids;Compensatory techniques;Environmental controls;Cueing hierarchy;Functional tasks    Potential to Achieve Goals  Good    SLP Home Exercise Plan  loud /a/ and phrase/sentence level tasks     Consulted and Agree with Plan of Care  Patient       Patient will benefit from skilled therapeutic intervention in order to improve the following deficits and impairments:   Dysarthria and anarthria    Problem List Patient Active Problem List   Diagnosis Date Noted  . Right lumbar radiculopathy 12/27/2017  . Gastroparesis 07/06/2017  . Orthostatic dizziness 05/12/2016  . Low back pain 05/12/2016  . Chronic constipation 12/17/2015  . Chronic insomnia 12/17/2015  . Left hip pain 12/16/2015  . Parkinson's disease ([place]) 09/11/2015  . HYPERTENSION 11/21/2008  . MURMUR 11/21/2008  . CHEST PAIN, ATYPICAL 11/21/2008    [facility] ,MS, CCC-SLP  06/01/2018, 4:43 PM  [facility] [address] [place], [place], [address] Phone: [phone]   Fax:  [phone]   Name: KLINT LEZCANO MRN: [id number] Date of Birth: August 31, 1971

## 2018-06-02 ENCOUNTER — Ambulatory Visit: Payer: 59 | Admitting: Neurology

## 2018-06-02 ENCOUNTER — Encounter: Payer: Self-pay | Admitting: Neurology

## 2018-06-02 VITALS — BP 146/93 | HR 57 | Ht 68.5 in | Wt 205.0 lb

## 2018-06-02 DIAGNOSIS — G20 Parkinson's disease: Secondary | ICD-10-CM | POA: Diagnosis not present

## 2018-06-02 NOTE — Therapy (Signed)
[facility] [address] [place], [place], [address] Phone: [phone]   Fax:  [phone]  Occupational Therapy Treatment  Patient Details  Name: CARMINO OCAIN MRN: [id number] Date of Birth: [birth year] No data recorded  Encounter Date: 06/01/2018  OT End of Session - 06/02/18 1245    Visit Number  2    Number of Visits  17    Date for OT Re-Evaluation  07/05/18    Authorization Type  [employer]     Authorization - Visit Number  2    Authorization - Number of Visits  24    Activity Tolerance  Patient tolerated treatment well    Behavior During Therapy  [facility] for tasks assessed/performed       Past Medical History:  Diagnosis Date  . Bell's palsy   . Constipation   . Hypertension   . Murmur   . Parkinsons ([facility])   . Weakness generalized     Past Surgical History:  Procedure Laterality Date  . NO PAST SURGERIES      There were no vitals filed for this visit.  Subjective Assessment - 06/01/18 1451    Currently in Pain?  Yes    Pain Score  2     Pain Location  Knee    Pain Orientation  Right    Pain Descriptors / Indicators  Aching    Pain Type  Acute pain    Pain Onset  More than a month ago    Pain Frequency  Intermittent    Aggravating Factors   walking    Pain Relieving Factors  knee brace                           OT Education - 06/02/18 1252    Education Details  PWR! supine 10-20 reps each, min v.c for techniques, initated eduction regarding safety to avoid future PD related complications and role of occupational theray in tx of PD disease, importance of consistent attendence to therapy, pt reports lifting heavy weight and pt was was cautioned aginst this due to risk for injury therapy,    Person(s) Educated  Patient    Methods  Explanation;Demonstration;Verbal cues    Comprehension  Verbalized understanding;Returned demonstration;Verbal cues required       OT Short Term  Goals - 05/05/18 1631      OT SHORT TERM GOAL #1   Title  Independent with PD specific HEP - 06/04/18    Time  4    Period  Weeks    Status  New      OT SHORT TERM GOAL #2   Title  Pt will verbalize understanding of ways to prevent future complications and verbalize understanding of appropriate community resources related to PD    Time  4    Period  Weeks    Status  New      OT SHORT TERM GOAL #3   Title  Pt will improve LUE function as evidenced by increasing Box & Blocks to 48 blocks    Baseline  45    Time  4    Period  Weeks    Status  New      OT SHORT TERM GOAL #4   Title  Pt will verbalize understanding with writing strategies to help maintain size and legibility    Time  4    Period  Weeks    Status  New  OT Long Term Goals - 05/05/18 1635      OT LONG TERM GOAL #1   Title  Pt will verbalize understanding of adaptive strategies for ADLS/IADLS - 07/05/18    Time  8    Period  Weeks    Status  New      OT LONG TERM GOAL #2   Title  Pt will improve Lt hand function as evidenced by performing 9 hole peg test in 28 sec. or under    Baseline  eval = 32.37 sec (Rt = 22.90 sec)    Time  8    Period  Weeks    Status  New      OT LONG TERM GOAL #3   Title  Pt will button/unbutton 3 buttons in 28 sec. or less    Baseline  eval = 31.29 sec    Time  8    Period  Weeks    Status  New      OT LONG TERM GOAL #4   Title  Pt will demo ability to write 3 sentences with 90% legibility and no significant decrease in letter size    Time  8    Period  Weeks    Status  New      OT LONG TERM GOAL #5   Title  Pt reports greater ease with shaving and brushing teeth RUE    Time  8    Period  Weeks    Status  New            Plan - 06/02/18 1246    Clinical Impression Statement  Pt is progressing towards goals. He demonstrates good performance of supine PWR! exercises, however pt is at risk for injuries/ falls due to implusive nature and cogntive deficits.     Occupational Profile and client history currently impacting functional performance  PD since 2016, HTN, heart murmur    Occupational performance deficits (Please refer to evaluation for details):  ADL's;IADL's;Work;Leisure;Social Participation    Rehab Potential  Good    OT Frequency  2x / week    OT Duration  8 weeks    OT Treatment/Interventions  Self-care/ADL training;DME and/or AE instruction;Therapeutic activities;Therapeutic exercise;Cognitive remediation/compensation;Coping strategies training;Neuromuscular education;Passive range of motion;Visual/perceptual remediation/compensation;Manual Therapy;Patient/family education    Plan  PWR! Hands, (following session: coordination HEP, and writing strategies)    Consulted and Agree with Plan of Care  Patient       Patient will benefit from skilled therapeutic intervention in order to improve the following deficits and impairments:  Decreased coordination, Decreased range of motion, Impaired flexibility, Impaired tone, Improper spinal/pelvic alignment, Impaired UE functional use, Decreased knowledge of use of DME, Decreased mobility, Decreased strength  Visit Diagnosis: Other symptoms and signs involving the musculoskeletal system  Other symptoms and signs involving the nervous system  Other lack of coordination  Abnormal posture    Problem List Patient Active Problem List   Diagnosis Date Noted  . Right lumbar radiculopathy 12/27/2017  . Gastroparesis 07/06/2017  . Orthostatic dizziness 05/12/2016  . Low back pain 05/12/2016  . Chronic constipation 12/17/2015  . Chronic insomnia 12/17/2015  . Left hip pain 12/16/2015  . Parkinson's disease ([facility]) 09/11/2015  . HYPERTENSION 11/21/2008  . MURMUR 11/21/2008  . CHEST PAIN, ATYPICAL 11/21/2008    RINE,KATHRYN 06/02/2018, 12:58 PM  [facility] [address] [place], [place], [address] Phone: [phone]   Fax:   [phone]  Name: LAZLO TUNNEY MRN:  [id number] Date of Birth: December 14, 1970

## 2018-06-02 NOTE — Patient Instructions (Signed)
Dr. Letta Median

## 2018-06-02 NOTE — Progress Notes (Signed)
Chief Complaint  Patient presents with  . Follow-up       PATIENT: Ruben Silva DOB: 04-Dec-1970  Chief Complaint  Patient presents with  . Follow-up    HISTORICAL  Ruben Silva 47 yo right-handed male, with early-onset idiopathic Parkinson's disease, his primary care physician Dr. Iona Beard, I saw him initially in June 2016  Since October 2015,he was noticed by the family to move at the slower pace, shuffling his gait,gradually getting worse over the past few months, he was also noticed to have right hand shaking, slow to response, decreased facial expression, to the point of needing help to pull up his pants sometimes, he also complains of orthostatic dizziness, difficulty sleeping at nighttime, fatigue, chronic constipation, impotence. 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, he has mild difficulty writing, complains of dizziness, especially with sudden positional change  MRI brain in June 2016 was normal, MRI of cervical spine, mild degenerative disc disease, no significant canal or foraminal stenosis CAT scan of the chest was normal  Laboratory in 2016 showed normal or Negative TSH, ESR, C-reactive protein RPR HIV, B12,CMP CBC, protein electrophoresis, more extensive laboratory evaluations, negative or normal, paraneoplastic panel, ceruloplasmin, copper level, vitamin B1, B12, vitamin D, CA 220, RPR, HIV, folic acid, C-reactive protein, CPK, ESR, protein electrophoresis, Lyme titer, CBC, CMP  He was seen by [facility] movement specialist Dr. Linus Mako in September 2016, concurred with the diagnosis of early-onset idiopathic Parkinson's disease  He started sinemet since June 2016, gradually titrating dose, which did help his symptoms, he notice wearing off despite quick titrating dose of Sinemet 25/100 mg, 2 tablets 4 times a day, wearing off dyskinesia, he was started on Stalevo 200 mg  since February 27 2016 at 6am, 10am, 4pm, 7pm, He goes to bed at 10pm.  But could not tolerate dopamine agonist or Azilect, Azilect-upset stomach, worsening dizziness, Requip-significant GI side effect, dizziness,  He walks regularly 2-4 miles each day.  he was taken to the emergency room in October 2016 after fall, dizziness, repeat CAT scan of the brain in October 2016 was normal  He now complains of constant left shoulder pain  UPDATE May 12 2016: Parkinson's disease: he was started on Stalevo 200 mg since February 27 2016 at 6am, 10am, 4pm, 7pm, He goes to bed at 10pm. He is very happy about current medications, denies significant bradykinesia, able to keep up with his working schedule, complains of orange color discoloration of the urine  Chronic Constipation: Once or twice each week, he took Dulcolax on the weekends,  Insomnia:  He takes clonazepam 1 mg every night works well   Orthostatic dizziness: Worsening with [place] agonist, and Azilect, improved after he stopped medications,  Update June 26 2016: He came here as outlined scheduled urgent visit, woke up in the morning noticed right lateral leg pain, increased gait difficulty because of the pain, continue have chronic constipation despite Linzess '145mg'$  daily treatment  UPDATE Aug 12 2016: He noticed wearing off of his levodopa before the next dose, he is taking Stalevo 50/200/200 at 6 AM,11am, 4pm, 8pm, "hit a brick wall around 11am every morning".   UPDATE November 10 2016: He complains of worsening slow movement, is now taking Stalevo 50-200-200 5 times a day, he denies significant side effect from medications,  He complains worsening constipation despite Linzess 290 mcg daily,  He also complains  of significant headaches, has been taking Excedrin Migraine once a day for 2 weeks,  he also complains of orthostatic dizziness, lightheaded when getting up quickly, there was one passing out episodes when he first had up from  prolonged sitting.  He sleeps well with clonazepam 2 mg every night.   UPDATE February 11 2017: Difficulty sleeping: He has difficulty sleeping, sometimes 3-4 night in a roll, seroqueal 25 mg the prescription was given in July 2017, if he take 25 mg he can sleep better, but has next morning hang over, he is also taking   clonazepam 1 mg 2 tablets at that time  Constipation, even with Linzess '290mg'$  daily  I am able to review CT abdomen scan on December 25 2016 marked gastric distention with no underlying cause identified, this could be due to gastroparesis,  I was able to review repeat CT abdomen on next a December 26 2016 from [facility] system, there was food filled distended stomach and proximal duodenum, EGD is pending on March 02 2017.  His tremor is more prominent, mild peak dose dyskinesia  He is now taking Stalevo 200 at 6, 10, 15, 18, 20, most difficult time is late morning,  UPDATE Jul 06 2017: He is in sublingual apomorphine clinical trial, which has helped his symptoms greatly,  He is now taking Stalevo 200 5 times a day,   He still has chronic constipations, taking  Linzess  290 mg daily. Mestinon '60mg'$  tid, Latulose as needed.  Chronic insomnia, he is taking clonazepam '1mg'$  2 tabs qhs.  UPDATE Sep 16 2017: He took Stalevo at 66, 25, 1400, 1730, 2100,  He took night dose, so he can move function in the morning time.  He complains of right leg pain, no low back pain, around right knee, was seen by orthopedic in the past, no significant abnormality found.   He has chronic cough, sore throat, no fever, feel tired.  He had mold in his house, is going to have his house inspected.   UPDATE December 27 2017: He has mild blurry vision, he has chronic low back pain, going done to his right leg, lateral thigh, to his right foot, he was seen by chiropractor,  He is taking stalevo 5 times a day.  Also involved in research trial at [facility] with Dr. Deboraha Sprang takes sublingual Apomorphine, but  only take it few times every few month  UPDATE March 31 2017: He continue have slow worsening of his Parkinson symptoms over the past few months, despite taking Stalevo 205 times a day, and also clonazepam 2 mg at nighttime for insomnia, today's examination is taking about 40 minutes after his last dose of levodopa, he was noted to have significant rigidity, bradykinesia  UPDATE Jun 02 2018: He is overall stable with current dose of Stalevo 50-200-200 five times a day, he has continue with his physical regularly, he wants to have second opinion with [name] movement specialist Dr. Yetta Flock  He still has intermittent constipation, insomnia, taking clonazepam 1 mg 2 tablets every night, sometimes mixed with Tylenol PM  REVIEW OF SYSTEMS: Full 14 system review of systems performed and notable only for as above.   ALLERGIES: No Known Allergies  HOME MEDICATIONS: Current Outpatient Prescriptions  Medication Sig Dispense Refill  . docusate sodium (COLACE) 100 MG capsule   3  . ibuprofen (ADVIL,MOTRIN) 200 MG tablet Take 200 mg by mouth every 6 (six) hours as needed.    . polyethylene glycol powder (GLYCOLAX/MIRALAX) powder     .  valsartan-hydrochlorothiazide (DIOVAN-HCT) 160-12.5 MG per tablet        PAST MEDICAL HISTORY: Past Medical History  Diagnosis Date  . Murmur   . Hypertension   . Bell's palsy   . Constipation   . Weakness generalized     PAST SURGICAL HISTORY: Past Surgical History:  Procedure Laterality Date  . NO PAST SURGERIES      FAMILY HISTORY: Family History  Problem Relation Age of Onset  . Hypertension Mother   . Liver disease Mother   . Hypercholesterolemia Mother   . Stroke Father   . Hypertension Father   . Diabetes Father     SOCIAL HISTORY:  History   Social History  . Marital Status: Married    Spouse Name: N/A  . Number of Children: 1  . Years of Education: College   Occupational History  . Government social research officer    Social History Main  Topics  . Smoking status: Never Smoker   . Smokeless tobacco: Not on file  . Alcohol Use: No  . Drug Use: No  . Sexual Activity: Not on file   Other Topics Concern  . Not on file   Social History Narrative   Lives at home with wife and son.   Right-hand.   Occasional use of caffeine.     PHYSICAL EXAM   Vitals:   06/02/18 1424  BP: (!) 146/93  Pulse: (!) 57  Weight: 205 lb (93 kg)  Height: 5' 8.5" (1.74 m)    Not recorded      Body mass index is 30.72 kg/m.  PHYSICAL EXAMNIATION: This exam is performed 4 hours after last dose of Stalevo 200  Gen: NAD, conversant, well nourised, obese, well groomed                     Cardiovascular: Regular rate rhythm, no peripheral edema, warm, nontender. Eyes: Conjunctivae clear without exudates or hemorrhage Neck: Supple, no carotid bruise. Pulmonary: Clear to auscultation bilaterally   NEUROLOGICAL EXAM:  MENTAL STATUS: tired looking middle aged male, mild masked face, Speech:    Speech is normal; fluent and spontaneous with normal comprehension.  Cognition:    The patient is oriented to person, place, and time;     recent and remote memory intact;     language fluent;     normal attention, concentration,     fund of knowledge.  CRANIAL NERVES: CN II: Visual fields are full to confrontation. Fundoscopic exam is normal with sharp discs and no vascular changes. Pupil equal round reactive to light CN III, IV, VI: extraocular movement are normal. No ptosis. CN V: Facial sensation is intact to pinprick in all 3 divisions bilaterally. Corneal responses are intact.  CN VII: Face is symmetric with normal eye closure and smile. CN VIII: Hearing is normal to rubbing fingers CN IX, X: Palate elevates symmetrically. Phonation is normal. CN XI: Head turning and shoulder shrug are intact CN XII: Tongue is midline with normal movements and no atrophy.  MOTOR:  He has mild to moderate bilateral upper and lower extremity, nuchal  rigidity, moderate right worse than the left limb bradykinesia.   REFLEXES: Reflexes are 2+ and symmetric at the biceps, triceps, knees, and ankles. Plantar responses are flexor.  SENSORY: Light touch, pinprick, position sense, and vibration sense are intact in fingers and toes.  COORDINATION: Rapid alternating movements and fine finger movements are intact. There is no dysmetria on finger-to-nose and heel-knee-shin.   GAIT/STANCE: He has mildly  decreased bilateral arm swing, decreased stride, mild enblock turning. Difficulty clear his feet from floor   DIAGNOSTIC DATA (LABS, IMAGING, TESTING) - I reviewed patient records, labs, notes, testing and imaging myself where available.  ASSESSMENT AND PLAN  Ruben Silva is a 47 y.o. male    Early onset idiopathic Parkinson's disease  Keep Stalevo to 50/200/200, 5 times a day every 3 hours.  Could not tolerate azilect, dopamine agonist Requip, due to GI side effect, increased orthostatic dizziness   Will refer him to [facility] movement specialist Dr. Brown Human for second opinion   Orthostatic dizziness  Encouraging moderate exercise, increase water intake  Chronic Constipation:  Continue moderate exercise, increase water intake  Chronic insomnia  Keep clonazepam 22mlligrams 2 tabs every night      YMarcial Pacas M.D. Ph.D.  [facility] [address] [place] [address]Ph: [phone]: [phone]

## 2018-06-06 ENCOUNTER — Encounter: Payer: 59 | Admitting: Occupational Therapy

## 2018-06-06 ENCOUNTER — Encounter: Payer: 59 | Admitting: Speech Pathology

## 2018-06-07 ENCOUNTER — Ambulatory Visit: Payer: 59 | Admitting: Occupational Therapy

## 2018-06-07 ENCOUNTER — Ambulatory Visit: Payer: 59 | Admitting: Speech Pathology

## 2018-06-07 DIAGNOSIS — R293 Abnormal posture: Secondary | ICD-10-CM

## 2018-06-07 DIAGNOSIS — R29898 Other symptoms and signs involving the musculoskeletal system: Secondary | ICD-10-CM

## 2018-06-07 DIAGNOSIS — R29818 Other symptoms and signs involving the nervous system: Secondary | ICD-10-CM

## 2018-06-07 DIAGNOSIS — R471 Dysarthria and anarthria: Secondary | ICD-10-CM | POA: Diagnosis not present

## 2018-06-07 DIAGNOSIS — R278 Other lack of coordination: Secondary | ICD-10-CM

## 2018-06-07 NOTE — Therapy (Signed)
[facility] [address] [place], [place], [address] Phone: [phone]   Fax:  [phone]  Speech Language Pathology Treatment  Patient Details  Name: Ruben Silva MRN: [id number] Date of Birth: 22-Aug-1971 Referring Provider (SLP): Jamey Ripa   Encounter Date: 06/07/2018  End of Session - 06/07/18 1710    Visit Number  8    Number of Visits  17    Date for SLP Re-Evaluation  07/01/18    SLP Start Time  1615    SLP Stop Time   1657    SLP Time Calculation (min)  42 min    Activity Tolerance  Patient tolerated treatment well       Past Medical History:  Diagnosis Date  . Bell's palsy   . Constipation   . Hypertension   . Murmur   . Parkinsons ([facility])   . Weakness generalized     Past Surgical History:  Procedure Laterality Date  . NO PAST SURGERIES      There were no vitals filed for this visit.  Subjective Assessment - 06/07/18 1621    Subjective  "This week my body is telling me rest."    Patient is accompained by:  Family member    Currently in Pain?  Yes    Pain Score  2     Pain Location  Leg    Pain Orientation  Right    Pain Descriptors / Indicators  Aching            ADULT SLP TREATMENT - 06/07/18 1615      General Information   Behavior/Cognition  Alert;Cooperative;Pleasant mood      Treatment Provided   Treatment provided  Cognitive-Linquistic      Pain Assessment   Pain Assessment  No/denies pain      Cognitive-Linquistic Treatment   Treatment focused on  Dysarthria    Skilled Treatment  SLP noted pt repetitive in conversation with OT when discussing scheduling today. Pt asked later in the session about whether "half of my brain cells aren't working," and states he does feel his thinking is "slower" than it used to be. SLP educated pt re: cognition vs intelligence and cognitive changes that can occur with Parkinson's disease. Pt asked SLP, "Do you think I really have  Parkinson's disease?" and SLP encouraged pt to discuss this with MD if he had questions about his diagnosis. In initial conversation, pt vocal intensity was sub-WNL (upper 60s). Loud /a/ at average 95 dB, rare min A x1 for abdominal vs chest effort. Sentence level tasks with cognitive load in low 70s with occasional min A to avoid loudness fade at end of sentences. Progressed to simple conversation; occasional min-mod (verbal and non-verbal) cues to maintain average ~70dB+.       Assessment / Recommendations / Plan   Plan  Continue with current plan of care      Progression Toward Goals   Progression toward goals  Progressing toward goals       SLP Education - 06/07/18 1709    Education Details  cognitive changes with Parkinson's disease    Person(s) Educated  Patient    Methods  Explanation    Comprehension  Verbalized understanding       SLP Short Term Goals - 06/07/18 1619      SLP SHORT TERM GOAL #1   Title  Pt will generate loud /a/ with average of low 90s dB over 4 sessions    Status  Achieved      SLP SHORT TERM GOAL #2   Title  Pt will use speech volume average low 70sdB when responding with sentences 18/20 over 3 sessions    Status  Achieved      SLP SHORT TERM GOAL #3   Title  pt will use abdominal breathing at rest 80% of the time over two sessions    Status  Partially Met      SLP SHORT TERM GOAL #4   Title  in 5-7 minutes simple conversation pt will generate speech volume of low 70s over 3 sessions    Status  Achieved       SLP Long Term Goals - 06/07/18 1620      SLP LONG TERM GOAL #1   Title  Pt will generate loud /a/ with average of low 90s dB over 6 total sessions    Baseline  04-29-18, 05-05-18, 06-01-18    Time  4    Period  Weeks    Status  On-going      SLP LONG TERM GOAL #2   Title  pt will use abdominal breathing 75% of the time in 8 minutes mod complex conversation over 3 sessions    Time  4    Period  Weeks    Status  On-going      SLP LONG  TERM GOAL #3   Title  in 8 minutes mod-max complex conversation pt will generate speech volume of low 70s over 3 sessions    Time  4    Period  Weeks    Status  On-going      SLP LONG TERM GOAL #4   Title  pt will tell SLP two websites to visit to learn more information about Parkinson's disease    Time  4    Period  Weeks    Status  On-going       Plan - 06/07/18 1710    Clinical Impression Statement  Pt presented today with sub WNL conversational volume when talking with OT prior to session. Carryover was seen today in short conversation after strucutred tasks. SLP ?s impulsivity answering questions vs. personality, and ? decr'd insight into deficits vs. decr'd education about Parkinson's. Pt remains highly motivated for change and would cont to benefit from cont'd skilled ST focusing on pt's goal of improving communicative ability with family and in the community.    Speech Therapy Frequency  2x / week    Treatment/Interventions  SLP instruction and feedback;Compensatory strategies;Patient/family education;Internal/external aids;Compensatory techniques;Environmental controls;Cueing hierarchy;Functional tasks    Potential to Achieve Goals  Good       Patient will benefit from skilled therapeutic intervention in order to improve the following deficits and impairments:   Dysarthria and anarthria    Problem List Patient Active Problem List   Diagnosis Date Noted  . Right lumbar radiculopathy 12/27/2017  . Gastroparesis 07/06/2017  . Orthostatic dizziness 05/12/2016  . Low back pain 05/12/2016  . Chronic constipation 12/17/2015  . Chronic insomnia 12/17/2015  . Left hip pain 12/16/2015  . Parkinson's disease ([facility]) 09/11/2015  . HYPERTENSION 11/21/2008  . MURMUR 11/21/2008  . CHEST PAIN, ATYPICAL 11/21/2008   Deneise Lever, [place], [place] Speech-Language Pathologist  Aliene Altes 06/07/2018, 5:12 PM  [facility] [address] [place], [place], [address] Phone: [phone]   Fax:  [phone]   Name: Ruben Silva MRN: [id number] Date of Birth: Apr 17, 1971

## 2018-06-07 NOTE — Patient Instructions (Signed)
PWR! Hands  With arms stretched out in front of you (elbows straight), perform the following:  PWR! Rock: Move wrists up and down [employer]! Twist: Twist palms up and down BIG  Then, start with elbows bent and hands closed.  PWR! Step: Touch index finger to thumb while keeping other fingers straight. Flick fingers out BIG (thumb out/straighten fingers). Repeat with other fingers. (Step your thumb to each finger).  PWR! Hands: Push hands out BIG. Elbows straight, wrists up, fingers open and spread apart BIG. (Can also perform by pushing down on table, chair, knees. Push above head, out to the side, behind you, in front of you.)   ** Make each movement big and deliberate so that you feel the movement.  Perform at least 10 repetitions 1x/day, but perform PWR! hands throughout the day when you are having trouble using your hands (picking up/manipulating small objects, writing, eating, typing, sewing, buttoning, etc.).     Coordination Exercises  Perform the following exercises for 20 minutes 1 times per day. Perform with both hand(s). Perform using big movements.   Flipping Cards: Place deck of cards on the table. Flip cards over by opening your hand big to grasp and then turn your palm up big.  Deal cards: Hold 1/2 or whole deck in your hand. Use thumb to push card off top of deck with one big push.  Pick up coins and stack one at a time: Pick up with big, intentional movements. Do not drag coin to the edge. (5-10 in a stack)  Pick up 5-10 coins one at a time and hold in palm. Then, move coins from palm to fingertips one at a time to stack.  Practice writing: Slow down, write big, and focus on forming each letter.  Perform "Flicks"/hand stretches (PWR! Hands): Close hands then flick out your fingers with focus on opening hands, pulling wrists back, and extending elbows like you are pushing.    Rotate a small ball in your fingertips

## 2018-06-08 NOTE — Therapy (Signed)
[facility] [address] [place], [place], [address] Phone: [phone]   Fax:  [phone]  Occupational Therapy Treatment  Patient Details  Name: Ruben Silva MRN: [id number] Date of Birth: [birth year] No data recorded  Encounter Date: 06/07/2018  OT End of Session - 06/08/18 1737    Visit Number  3    Number of Visits  17    Date for OT Re-Evaluation  07/05/18    Authorization Type  [employer]     Authorization - Visit Number  3    Authorization - Number of Visits  24    Activity Tolerance  Patient tolerated treatment well    Behavior During Therapy  [facility] for tasks assessed/performed       Past Medical History:  Diagnosis Date  . Bell's palsy   . Constipation   . Hypertension   . Murmur   . Parkinsons ([facility])   . Weakness generalized     Past Surgical History:  Procedure Laterality Date  . NO PAST SURGERIES      There were no vitals filed for this visit.  Subjective Assessment - 06/08/18 1740    Currently in Pain?  Yes    Pain Score  2     Pain Location  Leg    Pain Orientation  Right    Pain Descriptors / Indicators  Aching    Pain Type  Acute pain    Pain Onset  More than a month ago    Pain Frequency  Intermittent    Aggravating Factors   walking    Pain Relieving Factors  knee brace    Multiple Pain Sites  No                           OT Education - 06/08/18 1736    Education Details  PWR! hands, basic 4 10-15 reps each min v.c., coordination HEP- see pt instructions , increased time required for correct performance with big movements   Person(s) Educated  Patient    Methods  Explanation;Demonstration;Verbal cues    Comprehension  Verbalized understanding;Returned demonstration;Verbal cues required       OT Short Term Goals - 05/05/18 1631      OT SHORT TERM GOAL #1   Title  Independent with PD specific HEP - 06/04/18    Time  4    Period  Weeks    Status  New      OT SHORT TERM GOAL #2   Title  Pt will verbalize understanding of ways to prevent future complications and verbalize understanding of appropriate community resources related to PD    Time  4    Period  Weeks    Status  New      OT SHORT TERM GOAL #3   Title  Pt will improve LUE function as evidenced by increasing Box & Blocks to 48 blocks    Baseline  45    Time  4    Period  Weeks    Status  New      OT SHORT TERM GOAL #4   Title  Pt will verbalize understanding with writing strategies to help maintain size and legibility    Time  4    Period  Weeks    Status  New        OT Long Term Goals - 05/05/18 1635      OT LONG TERM GOAL #1  Title  Pt will verbalize understanding of adaptive strategies for ADLS/IADLS - 07/05/18    Time  8    Period  Weeks    Status  New      OT LONG TERM GOAL #2   Title  Pt will improve Lt hand function as evidenced by performing 9 hole peg test in 28 sec. or under    Baseline  eval = 32.37 sec (Rt = 22.90 sec)    Time  8    Period  Weeks    Status  New      OT LONG TERM GOAL #3   Title  Pt will button/unbutton 3 buttons in 28 sec. or less    Baseline  eval = 31.29 sec    Time  8    Period  Weeks    Status  New      OT LONG TERM GOAL #4   Title  Pt will demo ability to write 3 sentences with 90% legibility and no significant decrease in letter size    Time  8    Period  Weeks    Status  New      OT LONG TERM GOAL #5   Title  Pt reports greater ease with shaving and brushing teeth RUE    Time  8    Period  Weeks    Status  New            Plan - 06/08/18 1737    Clinical Impression Statement  Pt demonstrates understanding of coordination HEP.    Occupational performance deficits (Please refer to evaluation for details):  ADL's;IADL's;Work;Leisure;Social Participation    Rehab Potential  Good    OT Frequency  2x / week    OT Duration  8 weeks    OT Treatment/Interventions  Self-care/ADL training;DME and/or AE  instruction;Therapeutic activities;Therapeutic exercise;Cognitive remediation/compensation;Coping strategies training;Neuromuscular education;Passive range of motion;Visual/perceptual remediation/compensation;Manual Therapy;Patient/family education    Plan  big movements with ADLS, multi directional stepping, handwriting strategies    OT Home Exercise Plan  PWR! hands, coordination HEP, supine and quadraped PWR!    Consulted and Agree with Plan of Care  Patient       Patient will benefit from skilled therapeutic intervention in order to improve the following deficits and impairments:  Decreased coordination, Decreased range of motion, Impaired flexibility, Impaired tone, Improper spinal/pelvic alignment, Impaired UE functional use, Decreased knowledge of use of DME, Decreased mobility, Decreased strength  Visit Diagnosis: Other lack of coordination  Other symptoms and signs involving the musculoskeletal system  Other symptoms and signs involving the nervous system  Abnormal posture    Problem List Patient Active Problem List   Diagnosis Date Noted  . Right lumbar radiculopathy 12/27/2017  . Gastroparesis 07/06/2017  . Orthostatic dizziness 05/12/2016  . Low back pain 05/12/2016  . Chronic constipation 12/17/2015  . Chronic insomnia 12/17/2015  . Left hip pain 12/16/2015  . Parkinson's disease ([facility]) 09/11/2015  . HYPERTENSION 11/21/2008  . MURMUR 11/21/2008  . CHEST PAIN, ATYPICAL 11/21/2008    , 06/08/2018, 5:40 PM  [facility] [address] [place], [place], [address] Phone: [phone]   Fax:  [phone]  Name: Ruben Silva MRN: [id number] Date of Birth: 01-Jul-1971

## 2018-06-13 ENCOUNTER — Ambulatory Visit: Payer: 59

## 2018-06-13 DIAGNOSIS — R471 Dysarthria and anarthria: Secondary | ICD-10-CM | POA: Diagnosis not present

## 2018-06-13 NOTE — Therapy (Signed)
[facility] [address] [place], [place], [address] Phone: [phone]   Fax:  [phone]  Speech Language Pathology Treatment  Patient Details  Name: Ruben Silva MRN: [id number] Date of Birth: 02/02/1971 Referring Provider (SLP): Jamey Ripa   Encounter Date: 06/13/2018  End of Session - 06/13/18 1630    Visit Number  9    Number of Visits  17    Date for SLP Re-Evaluation  07/01/18    SLP Start Time  1536   pt checked in 1535   SLP Stop Time   8756    SLP Time Calculation (min)  39 min    Activity Tolerance  Patient tolerated treatment well       Past Medical History:  Diagnosis Date  . Bell's palsy   . Constipation   . Hypertension   . Murmur   . Parkinsons ([place])   . Weakness generalized     Past Surgical History:  Procedure Laterality Date  . NO PAST SURGERIES      There were no vitals filed for this visit.  Subjective Assessment - 06/13/18 1543    Subjective  "I get 5 per day. I gotta learn to do more per day." Pt shuffled into ST room.    Currently in Pain?  Yes    Pain Score  3     Pain Location  Back    Pain Orientation  Right    Pain Descriptors / Indicators  Aching    Pain Type  Acute pain   moved [place] over the weekend   Pain Onset  In the past 7 days    Pain Frequency  Intermittent            ADULT SLP TREATMENT - 06/13/18 1544      General Information   Behavior/Cognition  Alert;Cooperative;Pleasant mood      Treatment Provided   Treatment provided  Cognitive-Linquistic      Cognitive-Linquistic Treatment   Treatment focused on  Dysarthria    Skilled Treatment  Pt with loud /a/ to recalibrate conversational speech to more WNL. Pt produced average low 90's dB. He told SLP that he was completing loud /a/ reps once per day and indicated that he needed to be better at getting a second set in - SLP suggested while driving. Pt agreed this would be a good time to  complete. With sentence responses pt req'd occasional min-mod cues for loudness fade. With incr'd cognitive load pt productions consistently decr'd. SLP reiterated to pt that is why he needs to practice incr'd volume speech in ST. Pt read his fantasy league lineup for SLP and pt req'd usual visual and verbal cues for loudness.  Pt was told he  needed to schedule 3 more weeks ST and OT and SLP took pt to office rep to do so. Pt explained his desire for a movement disorders specialist in Parkinson's - SLP provided info about local movement disorders specialists for pt.      Assessment / Recommendations / Plan   Plan  Continue with current plan of care      Progression Toward Goals   Progression toward goals  Progressing toward goals       SLP Education - 06/13/18 1630    Education Details  local movement disorders specialists    Person(s) Educated  Patient    Methods  Explanation    Comprehension  Verbalized understanding       SLP Short Term  Goals - 06/13/18 1634      SLP SHORT TERM GOAL #1   Title  Pt will generate loud /a/ with average of low 90s dB over 4 sessions    Status  Achieved      SLP SHORT TERM GOAL #2   Title  Pt will use speech volume average low 70sdB when responding with sentences 18/20 over 3 sessions    Status  Achieved      SLP SHORT TERM GOAL #3   Title  pt will use abdominal breathing at rest 80% of the time over two sessions    Status  Partially Met      SLP SHORT TERM GOAL #4   Title  in 5-7 minutes simple conversation pt will generate speech volume of low 70s over 3 sessions    Status  Achieved       SLP Long Term Goals - 06/13/18 1634      SLP LONG TERM GOAL #1   Title  Pt will generate loud /a/ with average of low 90s dB over 6 total sessions    Baseline  04-29-18, 05-05-18, 06-01-18, 06-13-18    Time  3    Period  Weeks    Status  On-going      SLP LONG TERM GOAL #2   Title  pt will use abdominal breathing 75% of the time in 8 minutes mod complex  conversation over 3 sessions    Time  3    Period  Weeks    Status  On-going      SLP LONG TERM GOAL #3   Title  in 8 minutes mod-max complex conversation pt will generate speech volume of low 70s over 3 sessions    Time  3    Period  Weeks    Status  On-going      SLP LONG TERM GOAL #4   Title  pt will tell SLP two websites to visit to learn more information about Parkinson's disease    Time  3    Period  Weeks    Status  On-going       Plan - 06/13/18 1630    Clinical Impression Statement  Pt presented today with sub WNL conversational volume when walking with SLP back to [place] room. Pt shuffled from lobby to [place] room. "Because my back hurts, Alexes Lamarque." Pt success today varied in all levels of speech heirarchy (sentence to conversation). Pt remains motivated for change and would cont to benefit from cont'd skilled ST focusing on pt's goal of improving communicative ability with family and in the community.    Speech Therapy Frequency  2x / week    Duration  --   8 weeks or 17 visits   Treatment/Interventions  SLP instruction and feedback;Compensatory strategies;Patient/family education;Internal/external aids;Compensatory techniques;Environmental controls;Cueing hierarchy;Functional tasks    Potential to Achieve Goals  Good       Patient will benefit from skilled therapeutic intervention in order to improve the following deficits and impairments:   Dysarthria and anarthria    Problem List Patient Active Problem List   Diagnosis Date Noted  . Right lumbar radiculopathy 12/27/2017  . Gastroparesis 07/06/2017  . Orthostatic dizziness 05/12/2016  . Low back pain 05/12/2016  . Chronic constipation 12/17/2015  . Chronic insomnia 12/17/2015  . Left hip pain 12/16/2015  . Parkinson's disease ([place]) 09/11/2015  . HYPERTENSION 11/21/2008  . MURMUR 11/21/2008  . CHEST PAIN, ATYPICAL 11/21/2008    [place] ,MS, CCC-SLP  06/13/2018,  4:35 PM  [facility] [address] [place], [place], [address] Phone: [phone]   Fax:  [phone]   Name: Ruben Silva MRN: [id number] Date of Birth: 1970-09-11

## 2018-06-15 ENCOUNTER — Encounter: Payer: Self-pay | Admitting: Neurology

## 2018-06-15 ENCOUNTER — Other Ambulatory Visit: Payer: Self-pay | Admitting: Neurology

## 2018-06-15 ENCOUNTER — Ambulatory Visit: Payer: 59 | Admitting: Occupational Therapy

## 2018-06-16 ENCOUNTER — Encounter: Payer: Self-pay | Admitting: Neurology

## 2018-06-16 ENCOUNTER — Encounter: Payer: Self-pay | Admitting: Psychology

## 2018-06-16 ENCOUNTER — Ambulatory Visit: Payer: 59 | Admitting: Speech Pathology

## 2018-06-16 ENCOUNTER — Ambulatory Visit: Payer: 59 | Admitting: Occupational Therapy

## 2018-06-16 ENCOUNTER — Ambulatory Visit: Payer: 59 | Admitting: Neurology

## 2018-06-16 VITALS — BP 140/92 | HR 58 | Ht 68.75 in | Wt 203.0 lb

## 2018-06-16 DIAGNOSIS — F331 Major depressive disorder, recurrent, moderate: Secondary | ICD-10-CM | POA: Diagnosis not present

## 2018-06-16 DIAGNOSIS — R471 Dysarthria and anarthria: Secondary | ICD-10-CM | POA: Diagnosis not present

## 2018-06-16 DIAGNOSIS — F5104 Psychophysiologic insomnia: Secondary | ICD-10-CM

## 2018-06-16 DIAGNOSIS — R278 Other lack of coordination: Secondary | ICD-10-CM

## 2018-06-16 DIAGNOSIS — G20 Parkinson's disease: Secondary | ICD-10-CM | POA: Diagnosis not present

## 2018-06-16 DIAGNOSIS — R29818 Other symptoms and signs involving the nervous system: Secondary | ICD-10-CM

## 2018-06-16 DIAGNOSIS — R29898 Other symptoms and signs involving the musculoskeletal system: Secondary | ICD-10-CM

## 2018-06-16 MED ORDER — ROTIGOTINE 4 MG/24HR TD PT24: 1.0000 | MEDICATED_PATCH | Freq: Every day | TRANSDERMAL | 2 refills | Status: DC

## 2018-06-16 MED ORDER — ROTIGOTINE 2 MG/24HR TD PT24: 1.0000 | MEDICATED_PATCH | Freq: Every day | TRANSDERMAL | 0 refills | Status: DC

## 2018-06-16 MED ORDER — ROTIGOTINE 4 MG/24HR TD PT24: 1.0000 | MEDICATED_PATCH | Freq: Every day | TRANSDERMAL | 0 refills | Status: DC

## 2018-06-16 NOTE — Progress Notes (Signed)
I met with the patient today while he was in the clinic.  I have met with him previously at the Parkinson's education symposium and have talked with him most recently after his mother passed away and he was unable to meet the young Parkinson's group at a scheduled event.  He reports a lot of sadness and feelings of loss and grief related to the passing of his mother.  He reports that his mother was a big part of his emotional support system and he talked with her every morning on his way to work.  Her passing has made him feel more alone. He reports feeling depressed more frequently lately.   He has an adult son who is 57 years old and lives with him.  His son is not very active in the home and is often gone at his girlfriend's house.  His wife left him and they have been separated for roughly 7 months.  He stated that he was thinking that they were getting back together and he had recently purchased a ring for her.  However today, he was very disappointed to learn that she would not come to his doctor's appointment with him.  The patient would highly benefit from counseling services to help with his adjustment to loss and grief.  In addition, he could use counseling as he is navigating through the emotional challenges of living with Parkinson's and the reality of his relationship with his estranged wife and finding ways to build a emotional support system.  He gave me permission to contact [facility] and make a referral for him.  In addition, I asked for him to call Lannette Donath at the [facility] to ensure that he goes to the November 9 thrive group.  Given that he has young onset Parkinson's disease it is imperative that he is connected and feels less isolated.  This group would offer him the opportunity to be with peers in a healthy environment.

## 2018-06-16 NOTE — Patient Instructions (Signed)
1. Reduce Pyridostigmine to 1 tablet twice daily for one week,  Then 1 tablet daily for one week, then stop.   2. In one month start Neupro patch. Samples given. You will take 2 mg patches for one week, then increase to 4 mg patches. Prescription sent to your pharmacy.

## 2018-06-16 NOTE — Patient Instructions (Addendum)
  Make sure to follow-up with the counselor Dr. Arbutus Leas recommended.  If you are having thoughts of suicide, please go to the emergency room or call 911.

## 2018-06-16 NOTE — Therapy (Signed)
[facility] [address] [place], [place], [address] Phone: [phone]   Fax:  [phone]  Occupational Therapy Treatment  Patient Details  Name: Ruben Silva MRN: [id number] Date of Birth: 1971-04-20 No data recorded  Encounter Date: 06/16/2018  OT End of Session - 06/16/18 1638    Visit Number  4    Number of Visits  17    Date for OT Re-Evaluation  07/05/18    Authorization Type  [employer]     Authorization - Visit Number  4    Authorization - Number of Visits  24    OT Start Time  1530    OT Stop Time  1620    OT Time Calculation (min)  50 min    Activity Tolerance  Patient tolerated treatment well    Behavior During Therapy  [facility] for tasks assessed/performed       Past Medical History:  Diagnosis Date  . Bell's palsy   . Constipation   . Erectile dysfunction   . History of stomach ulcers   . Hypertension   . Insomnia   . Knee pain   . Murmur   . Parkinson disease (HCC)   . Parkinsons (HCC)   . Weakness generalized     Past Surgical History:  Procedure Laterality Date  . NO PAST SURGERIES      There were no vitals filed for this visit.  Subjective Assessment - 06/16/18 1537    Subjective   I think I have more ulcers d/t stress. My mother died in 04-May-2023, and my wife and I are seperated. I also have a lot of projects at work    Pertinent History  PD, heart murmur, HTN    Currently in Pain?  Yes    Pain Score  7     Pain Location  Abdomen    Pain Orientation  Right    Pain Descriptors / Indicators  Pressure    Pain Type  Acute pain    Pain Onset  1 to 4 weeks ago    Pain Frequency  Constant       Neuro re-education: Reviewed PWR! Hand HEP - pt return demo of each Therapeutic Activity: Reviewed coordination activities with focus on large movements Self care: Issued and reviewed big movement strategies with ADLS. Pt also practiced cutting food, donning/doffing jacket, and opening jar  using large movement strategies. Also discussed opening hand big to grasp/hold items, and holding knife in middle vs. end before cutting food. Reviewed general health including importance of daily exercise, getting proper sleep, and eating healthy (drinking plenty of water and high fiber foods). Pt reports he has been stressed lately and admits to depression and not getting much sleep. At end of session, pt expressed thoughts of suicide. Pt was seen by movement disorder specialist (Dr. Arbutus Leas) today and by case manager who already sent info to [facility] with patient's permission, and instructed pt what to do if he was suicidal. Pt was also given resources on counselor and [facility] (for early onset PD patients). This therapist also gave speech therapist suicide risk screen who was immediately seeing him after O.T. Session.                     OT Education - 06/16/18 1637    Education Details  big movement strategies w/ ADLS, handwriting strategies    Person(s) Educated  Patient    Methods  Explanation;Demonstration;Handout  Comprehension  Verbalized understanding;Returned demonstration   return demo of UE dressing, cutting food, opening jars/containers      OT Short Term Goals - 05/05/18 1631      OT SHORT TERM GOAL #1   Title  Independent with PD specific HEP - 06/04/18    Time  4    Period  Weeks    Status  New      OT SHORT TERM GOAL #2   Title  Pt will verbalize understanding of ways to prevent future complications and verbalize understanding of appropriate community resources related to PD    Time  4    Period  Weeks    Status  New      OT SHORT TERM GOAL #3   Title  Pt will improve LUE function as evidenced by increasing Box & Blocks to 48 blocks    Baseline  45    Time  4    Period  Weeks    Status  New      OT SHORT TERM GOAL #4   Title  Pt will verbalize understanding with writing strategies to help maintain size and legibility    Time  4     Period  Weeks    Status  New        OT Long Term Goals - 05/05/18 1635      OT LONG TERM GOAL #1   Title  Pt will verbalize understanding of adaptive strategies for ADLS/IADLS - 07/05/18    Time  8    Period  Weeks    Status  New      OT LONG TERM GOAL #2   Title  Pt will improve Lt hand function as evidenced by performing 9 hole peg test in 28 sec. or under    Baseline  eval = 32.37 sec (Rt = 22.90 sec)    Time  8    Period  Weeks    Status  New      OT LONG TERM GOAL #3   Title  Pt will button/unbutton 3 buttons in 28 sec. or less    Baseline  eval = 31.29 sec    Time  8    Period  Weeks    Status  New      OT LONG TERM GOAL #4   Title  Pt will demo ability to write 3 sentences with 90% legibility and no significant decrease in letter size    Time  8    Period  Weeks    Status  New      OT LONG TERM GOAL #5   Title  Pt reports greater ease with shaving and brushing teeth RUE    Time  8    Period  Weeks    Status  New            Plan - 06/16/18 1638    Clinical Impression Statement  Pt progressing slowly towards STG's. Pt limited by stress, depression and consequent decreased attendance since initial evaluation    Occupational Profile and client history currently impacting functional performance  PD since 2016, HTN, heart murmur    Occupational performance deficits (Please refer to evaluation for details):  ADL's;IADL's;Work;Leisure;Social Participation    Rehab Potential  Good    OT Frequency  2x / week    OT Duration  8 weeks    OT Treatment/Interventions  Self-care/ADL training;DME and/or AE instruction;Therapeutic activities;Therapeutic exercise;Cognitive remediation/compensation;Coping strategies training;Neuromuscular education;Passive range of motion;Visual/perceptual remediation/compensation;Manual  Therapy;Patient/family education    Plan  multi directional stepping, develop ex chart for patient    OT Home Exercise Plan  PWR! hands, coordination HEP,  supine and quadraped PWR!, handwriting strategies, big movement strategies w/ ADLS    Consulted and Agree with Plan of Care  Patient       Patient will benefit from skilled therapeutic intervention in order to improve the following deficits and impairments:  Decreased coordination, Decreased range of motion, Impaired flexibility, Impaired tone, Improper spinal/pelvic alignment, Impaired UE functional use, Decreased knowledge of use of DME, Decreased mobility, Decreased strength  Visit Diagnosis: Other symptoms and signs involving the nervous system  Other symptoms and signs involving the musculoskeletal system  Other lack of coordination    Problem List Patient Active Problem List   Diagnosis Date Noted  . Right lumbar radiculopathy 12/27/2017  . Gastroparesis 07/06/2017  . Orthostatic dizziness 05/12/2016  . Low back pain 05/12/2016  . Chronic constipation 12/17/2015  . Chronic insomnia 12/17/2015  . Left hip pain 12/16/2015  . Parkinson's disease (HCC) 09/11/2015  . HYPERTENSION 11/21/2008  . MURMUR 11/21/2008  . CHEST PAIN, ATYPICAL 11/21/2008    Kelli Churn, OTR/L 06/16/2018, 4:41 PM  [facility] [address] [place], [place], [address] Phone: [phone]   Fax:  [phone]  Name: Daimen Shovlin MRN: [id number] Date of Birth: 1971-06-27

## 2018-06-16 NOTE — Progress Notes (Signed)
Ruben Silva was seen today in the movement disorders clinic for neurologic consultation at the request of Juanda Chance.  The consultation is for the evaluation of Parkinson's disease.  Prior neurology records are reviewed.  Patient has been under the care of Dr. Krista Blue.  Patient symptoms started in October, 2015 with gait change.  He presented to Dr. Krista Blue in June, 2016 with slow, shuffling gait and tremors.  His initial starting dose of levodopa was "up to 2 tablets 3 times per day."  Levodopa did help.  He was referred to Dr. Linus Mako in September, 2016.  He has been following both at [facility] as well as [facility].  At that time, Dr. Linus Mako felt a levodopa sparing strategy was best, but did not change his levodopa.  He did recommend Neupro, but did not start it.  Patient was already on clonazepam and this was increased by Dr. Linus Mako to 1 mg nightly.  He did try ropinirole in early 2017 but was not able to tolerate it because of dizziness and GI side effects.  At some point this is reintroduced (mid 2017) but seems to cause GI upset again.  Azilect was initiated but ultimately discontinued because of GI issues.  In June, 2017 he was changed from carbidopa/levodopa 25/100 to Stalevo.  He is currently on Stalevo 200, 1 tablet 5 times per day.  He is also on clonazepam, 2 mg at night.  He is on Mestinon, 60 mg 3 times per day.  He was last seen by Dr. Krista Blue on June 02, 2018 at which point he was referred to Dr. Anna Genre.  He does have an appointment with [name], but this was with Dr. Alroy Dust on August 15, 2018  He last saw Dr. Deboraha Sprang in September, 2018.  However, he has been participating in a sublingual apomorphine study.  He has an appointment with Dr. Linus Mako next month.   Specific Symptoms:  Tremor: No. Family hx of similar:  No. Voice: gotten weaker.  Currently doing voice therapy Sleep: trouble sleeping and taking klonopin.  He takes it 3-4 times per week as "I don't want to be  addicted."  Vivid Dreams:  No.  Acting out dreams:  No. Wet Pillows: No. Postural symptoms:  Yes.    Falls?  No. Bradykinesia symptoms: shuffling gait Loss of smell:  No. Loss of taste:  No. Urinary Incontinence:  No.  Difficulty Swallowing:  No. Handwriting, micrographia: Yes.   Trouble with ADL's:  No.  Trouble buttoning clothing: No. Depression:  Yes.   (separated from wife x 7-8 months; "its taken a toll.").  26y/o son lives with him and pt pays car insurance, etc.  Son just started working Memory changes:  No. Hallucinations:  No.  visual distortions: No. N/V:  No. Lightheaded:  No.  Syncope: No. Diplopia:  Rarely if starting at the computer too long Dyskinesia:  No. Prior exposure to reglan/antipsychotics: No.  MRI of the brain was completed in June, 2016.  I have reviewed this and it was normal.  He also had an MRI of the cervical spine in 2016 which was essentially unremarkable.  PREVIOUS MEDICATIONS: Requip and Azilect (both with GI side effects and dizziness)  ALLERGIES:  No Known Allergies  CURRENT MEDICATIONS:  Outpatient Encounter Medications as of 06/16/2018  Medication Sig  . albuterol (PROVENTIL) (2.5 MG/3ML) 0.083% nebulizer solution Take 2.5 mg by nebulization every 6 (six) hours as needed for wheezing or shortness of breath.  . carbidopa-levodopa-entacapone (STALEVO) 50-200-200  MG tablet Take 1 tablet by mouth 5 (five) times daily. Every 3 hours  . clonazePAM (KLONOPIN) 1 MG tablet Take 2 tablets (2 mg total) by mouth at bedtime. Do not refill in less than 30 days  . diclofenac sodium (VOLTAREN) 1 % GEL Apply topically 4 (four) times daily.  Marland Kitchen losartan (COZAAR) 50 MG tablet Take 50 mg by mouth daily.  . meloxicam (MOBIC) 15 MG tablet Take 15 mg by mouth daily.  Marland Kitchen neomycin-polymyxin b-dexamethasone (MAXITROL) 2.3-76283-1.5 OINT 1 application.  . propranolol (INDERAL) 40 MG tablet TAKE 2 TABLETS TWICE A DAY  . pyridostigmine (MESTINON) 60 MG tablet Take 1  tablet (60 mg total) by mouth 3 (three) times daily.  . sildenafil (REVATIO) 20 MG tablet Take 1 tablet (20 mg total) by mouth 3 (three) times daily. (Patient not taking: Reported on 06/16/2018)  . tadalafil (ADCIRCA/CIALIS) 20 MG tablet Take 20 mg by mouth daily as needed for erectile dysfunction.  . [DISCONTINUED] carbidopa-levodopa-entacapone (STALEVO) 50-200-200 MG tablet Take 1 tablet by mouth 5 (five) times daily.  . [DISCONTINUED] clonazePAM (KLONOPIN) 1 MG tablet Take 2 mg by mouth at bedtime.  . [DISCONTINUED] propranolol (INDERAL) 40 MG tablet Take 40 mg by mouth 3 (three) times daily.  . [DISCONTINUED] pyridostigmine (MESTINON) 60 MG tablet Take 60 mg by mouth 3 (three) times daily.   No facility-administered encounter medications on file as of 06/16/2018.     PAST MEDICAL HISTORY:   Past Medical History:  Diagnosis Date  . Bell's palsy   . Constipation   . Erectile dysfunction   . Hypertension   . Insomnia   . Knee pain   . Murmur   . Parkinson disease ([place])   . Parkinsons ([place])   . Weakness generalized     PAST SURGICAL HISTORY:   Past Surgical History:  Procedure Laterality Date  . NO PAST SURGERIES      SOCIAL HISTORY:   Social History   Socioeconomic History  . Marital status: Married    Spouse name: Not on file  . Number of children: 1  . Years of education: College  . Highest education level: Not on file  Occupational History  . Occupation: Government social research officer  Social Needs  . Financial resource strain: Not on file  . Food insecurity:    Worry: Not on file    Inability: Not on file  . Transportation needs:    Medical: Not on file    Non-medical: Not on file  Tobacco Use  . Smoking status: Never Smoker  . Smokeless tobacco: Never Used  Substance and Sexual Activity  . Alcohol use: Never    Frequency: Never  . Drug use: Never  . Sexual activity: Not on file  Lifestyle  . Physical activity:    Days per week: Not on file    Minutes per session:  Not on file  . Stress: Not on file  Relationships  . Social connections:    Talks on phone: Not on file    Gets together: Not on file    Attends religious service: Not on file    Active member of club or organization: Not on file    Attends meetings of clubs or organizations: Not on file    Relationship status: Not on file  . Intimate partner violence:    Fear of current or ex partner: Not on file    Emotionally abused: Not on file    Physically abused: Not on file    Forced  sexual activity: Not on file  Other Topics Concern  . Not on file  Social History Narrative   ** Merged History Encounter **       Lives at home with wife and son. Right-hand. Occasional use of caffeine.    FAMILY HISTORY:   Family Status  Relation Name Status  . Mother  Deceased  . Father  Alive  . Sister 2 Alive  . Brother 2 Deceased  . Son  Alive  . Daughter  Alive    ROS:  Review of Systems  Constitutional: Negative.   HENT: Negative.   Eyes: Negative.   Respiratory: Positive for shortness of breath (had EKG and told normal.  being w/u by PCP).   Cardiovascular: Negative.   Gastrointestinal: Positive for constipation.  Musculoskeletal: Positive for joint pain (knee).  Skin: Negative.   Endo/Heme/Allergies: Negative.   Psychiatric/Behavioral: The patient is nervous/anxious.     PHYSICAL EXAMINATION:    VITALS:   Vitals:   06/16/18 0849  BP: (!) 140/92  Pulse: (!) 58  SpO2: 97%  Weight: 203 lb (92.1 kg)  Height: 5' 8.75" (1.746 m)    GEN:  The patient appears stated age and is in NAD. HEENT:  Normocephalic, atraumatic.  The mucous membranes are moist. The superficial temporal arteries are without ropiness or tenderness. CV:  RRR Lungs:  CTAB Neck/HEME:  There are no carotid bruits bilaterally.  Neurological examination:  Orientation: The patient is alert and oriented x3. Fund of knowledge is appropriate.  Recent and remote memory are intact.  Attention and concentration are  normal.    Able to name objects and repeat phrases. Cranial nerves: There is good facial symmetry. There is facial hypomimia.  Pupils are equal round and reactive to light bilaterally. Fundoscopic exam reveals clear margins bilaterally. Extraocular muscles are intact. The visual fields are full to confrontational testing. The speech is fluent and clear. he is hypophonic.  Soft palate rises symmetrically and there is no tongue deviation. Hearing is intact to conversational tone. Sensation: Sensation is intact to light and pinprick throughout (facial, trunk, extremities). Vibration is intact at the bilateral big toe. There is no extinction with double simultaneous stimulation. There is no sensory dermatomal level identified. Motor: Strength is 5/5 in the bilateral upper and lower extremities.   Shoulder shrug is equal and symmetric.  There is no pronator drift. Deep tendon reflexes: Deep tendon reflexes are 2/4 at the bilateral biceps, triceps, brachioradialis, 2+-3/4 at the bilateral patella and achilles. Plantar responses are downgoing bilaterally.  Movement examination: Tone: There is mild increased tone in the bilateral upper extremities.  The tone in the lower extremities is normal.  Abnormal movements: none Coordination:  There is decremation with RAM's, with any form of RAMS, including alternating supination and pronation of the forearm, hand opening and closing, finger taps, heel taps and toe taps bilateral. Gait and Station: The patient has no difficulty arising out of a deep-seated chair without the use of the hands. The patient's stride length is just slightly decreased but he does not shuffle.  The patient has a negative pull test.      ASSESSMENT/PLAN:  1.  Idiopathic Parkinson's disease, diagnosed in 2016 with symptoms to 2015  -At initial diagnosis, patient was started on a fairly large dose of levodopa (carbidopa/levodopa 25/100, 2 tablets 3 times per day).  He tried Requip for a very  short period of time and this caused dizziness.  He is currently on Stalevo 200, 1 tablet 5  times per day (1000 mg total per day of levodopa)  -I would recommend starting a different dopamine agonist, perhaps rotigotine given the slow release over 24 hours.  Patient was agreeable.  We will start rotigotine and work to 4 mg daily.  Discussed risk, benefits, and side effects.  -He and I talked about the importance of continuity of care.  He has consistently been seeing Dr. Krista Blue, but also has been seeing Dr. Deboraha Sprang.  He was just referred to [facility] and has appointments with Dr. Deboraha Sprang next week and [name] in December.  He and I talked about the fact that he really only needs one movement specialist and certainly not 3.  Talked about fact that everyone he sees is good, but probably only needs 1 movement specialist.  -he is in amorphine study for sublingual Apomorphine.  He uses this every few days.  He does find it beneficial.  He will continue to follow with [facility] for that.  -met with social worker  2.  he is on [place] presumably for [facility]  -reviewed records and he was started on it for gastroparesis.  However, now he has had to go on antihypertensives.  I am going to slowly d/c this.  He also has a lot of GI upset which could be from this medication.  Pt to monitor BP.  3.  Depression  -wife and pt just separated and mother died in 04/16/2023.  Talked about counseling.  Resources were given.  -was going to start remeron, 15 mg nightly, but started other meds today.  Will consider.  Goal is to decrease klonopin.  He is on a high dosage and pt doesn't wish to be on this high dose either.  -He has had suicidal thoughts in the past.  He had a Passenger transport manager.  Will call 911/go to hospital should he have a plan.  4.  If the patient chooses to stay here, I will plan on seeing him in December since I am changing medications.  If not, he has appointments next week at [facility] and in December with [name].  Much greater  than 50% of this visit was spent in counseling and coordinating care.  Total face to face time:  60 min.  This did not include the 40 min of record review which was detailed above, which was non face to face time.   Cc:  Mindi Curling, PA-C

## 2018-06-16 NOTE — Addendum Note (Signed)
Addended by: Nada Maclachlan on: 06/16/2018 02:31 PM   Modules accepted: Orders

## 2018-06-16 NOTE — Patient Instructions (Signed)
Performing Daily Activities with Big Movements  Pick at least one activity a day and perform with BIG, DELIBERATE movements/effort. This can make the activity easier and turn daily activities into exercise!  If you are standing during the activity, make sure to keep feet apart and stand with good/big/PWR! UP posture.  Examples: Dressing - Push arms in sleeves, twist when putting on jacket, push foot into pants, open hands to pull down shirt/put on socks/pull up pants Bathing - Wash/dry with long strokes Brushing your teeth - Big, slow movements Cutting food - Long deliberate cuts Opening jar/bottle - Move as much as you can with each turn Picking up a cup/bottle - Open hand up big and get object all the way in palm Hanging up clothes/getting clothes down from closet - Reach with big effort Putting away groceries/dishes - Reach with big effort Wiping counter/table - Move in big, long strokes Stirring while cooking - Exaggerate movement Cleaning windows - Move in big, long strokes Sweeping - Move arms in big, long strokes Vacuuming - Push with big movement Folding clothes - Exaggerate arm movements Washing car - Move in big, long strokes Changing light bulb - Move as much as you can with each turn Using a screwdriver - Move as much as you can with each turn Walking into a store/restaurant - Walk with big steps, swing arms if able Standing up from a chair/recliner/sofa - Scoot forward, lean forward, and stand with big effort 

## 2018-06-16 NOTE — Addendum Note (Signed)
Addended bySilvio Pate on: 06/16/2018 10:31 AM   Modules accepted: Orders

## 2018-06-16 NOTE — Therapy (Signed)
Ruben Silva [address] [place], [place], [address] Phone: [phone]   Fax:  [phone]  Speech Language Pathology Treatment  Patient Details  Name: Ruben Silva MRN: [id number] Date of Birth: [birth year] Referring Provider (SLP): Jamey Ripa   Encounter Date: 06/16/2018  End of Session - 06/16/18 1718    Visit Number  10    Number of Visits  17    Date for SLP Re-Evaluation  07/01/18    SLP Start Time  1619   pt came late from OT   SLP Stop Time   1700    SLP Time Calculation (min)  41 min    Activity Tolerance  Patient tolerated treatment well       Past Medical History:  Diagnosis Date  . Bell's palsy   . Constipation   . Erectile dysfunction   . History of stomach ulcers   . Hypertension   . Insomnia   . Knee pain   . Murmur   . Parkinson disease ([place])   . Parkinsons ([place])   . Weakness generalized     Past Surgical History:  Procedure Laterality Date  . NO PAST SURGERIES      There were no vitals filed for this visit.  Subjective Assessment - 06/16/18 1707    Subjective  "Today was a hard day."    Patient is accompained by:  Family member   son and son's girlfriend   Currently in Pain?  No/denies        Screening for Suicide  1.Over the past two weeks, have you felt down, depressed or hopeless? Yes   2.Within the past two weeks, have you felt little interest or pleasure in life? Yes  If YES to either #1 or #2, then ask #3  3.Have you had thoughts that this life is not worth living or that you might be better off dead? Yes  If answer is NO and suspicion is low, then end.  4.Over this past week, have you had any thoughts about hurting or even killing yourself? Yes  If NO, then end.  Patient in no immediate danger  5.If so, do you believe that you intend to or will harm yourself? No, screen ended  If NO, then end.  Patient in no immediate danger  6.Do you have a  plan as to how you would hurt yourself?  7.Over this past week, have you actually done anything to hurt yourself?  If YES answers to either #4, #5, #6, or #7, then patient is AT RISK for suicide   Actions Taken:  -Screening negative; no further action required  -Screening positive; no immediate danger and patient already in treatment with a mental health provider.  Advise patient to speak to their mental health provider.  X  Screening positive; no immediate danger.  Patient advised to contact a mental health provider for further assessment.  -Screening positive; in immediate danger as patient states intentions of killing self, has plan and a sense of imminence.  Do not leave patient alone.  Seek permission from patient to contact a family member to inform them.  Direct patient to go to ED.      ADULT SLP TREATMENT - 06/16/18 1421      General Information   Behavior/Cognition  Alert;Cooperative   flat affect   Patient Positioning  Upright in chair      Treatment Provided   Treatment provided  Cognitive-Linquistic      Cognitive-Linquistic Treatment  Treatment focused on  Dysarthria;Patient/family/caregiver education    Skilled Treatment  Pt entered treatment room with flat affect, reporting feeling down. He saw Dr. Carles Collet earlier today. Pt reported thinking at times "It would be better if I just didn't have to deal with it," and admitted having suicidal thoughts occasionally. SLP completed suidicide screen (see below), screening positive with no immediate danger. Per chart review, this was also discussed with Dr. Carles Collet today who has provided pt with information re: mental health provider. Pt reports he plans to seek counseling. Pt's son joined session and SLP encouraged them to follow-up with counselor and to call 911 or visit emergency room should pt have a plan to hurt himself. Pt's voice low intensity during initial conversation. SLP recalibrated using loud /a/, which pt reports he is  now completing 2 sets of 5 daily. Average was mid 90s dB at 30cm. In subsequent sentence-level tasks with cognitive load, initial min A to use similar effort to loud /a/; average subsequently in low-mid 70s dB. Carryover to spontaneous 2-3 sentence responses with occasional non-verbal cues. Simple conversation (5 minutes) with occasional verbal cues required for carryover of increased vocal intensity. Between tasks, pt's comments to his son were barely audible, although pt correcting 80% of the time with non-verbal cues by end of session.       Assessment / Recommendations / Plan   Plan  Continue with current plan of care      Progression Toward Goals   Progression toward goals  Progressing toward goals        SLP Education - 06/16/18 1717    Education Details  follow up with mental health provider, call 911 or go to ED if plans to harm himself    Person(s) Educated  Patient;Child(ren)    Methods  Explanation;Handout    Comprehension  Verbalized understanding       SLP Short Term Goals - 06/16/18 1721      SLP SHORT TERM GOAL #1   Title  Pt will generate loud /a/ with average of low 90s dB over 4 sessions    Status  Achieved      SLP SHORT TERM GOAL #2   Title  Pt will use speech volume average low 70sdB when responding with sentences 18/20 over 3 sessions    Status  Achieved      SLP SHORT TERM GOAL #3   Title  pt will use abdominal breathing at rest 80% of the time over two sessions    Status  Partially Met      SLP SHORT TERM GOAL #4   Title  in 5-7 minutes simple conversation pt will generate speech volume of low 70s over 3 sessions    Status  Achieved       SLP Long Term Goals - 06/16/18 1721      SLP LONG TERM GOAL #1   Title  Pt will generate loud /a/ with average of low 90s dB over 6 total sessions    Baseline  04-29-18, 05-05-18, 06-01-18, 06-13-18, 06/16/18    Time  3    Period  Weeks    Status  On-going      SLP LONG TERM GOAL #2   Title  pt will use abdominal  breathing 75% of the time in 8 minutes mod complex conversation over 3 sessions    Time  3    Period  Weeks    Status  On-going      SLP LONG TERM  GOAL #3   Title  in 8 minutes mod-max complex conversation pt will generate speech volume of low 70s over 3 sessions    Time  3    Period  Weeks    Status  On-going      SLP LONG TERM GOAL #4   Title  pt will tell SLP two websites to visit to learn more information about Parkinson's disease    Time  3    Period  Weeks    Status  On-going       Plan - 06/16/18 1718    Clinical Impression Statement  Pt presented today with sub WNL conversational volume when entering treatment room. Due to pt endorsing thoughts of suicide, SLP administered suicide screen, which was positive but pt in no immediate danger. Please see above. Reinforced follow-up with mental health provider who was recommended to pt by Dr. Carles Collet today. Pt success today varied in all levels of speech heirarchy (sentence to conversation). Pt remains motivated for change and would cont to benefit from cont'd skilled ST focusing on pt's goal of improving communicative ability with family and in the community.    Speech Therapy Frequency  2x / week    Duration  --   8 weeks or 17 visits   Treatment/Interventions  SLP instruction and feedback;Compensatory strategies;Patient/family education;Internal/external aids;Compensatory techniques;Environmental controls;Cueing hierarchy;Functional tasks    Potential to Achieve Goals  Good       Patient will benefit from skilled therapeutic intervention in order to improve the following deficits and impairments:   Dysarthria and anarthria    Problem List Patient Active Problem List   Diagnosis Date Noted  . Right lumbar radiculopathy 12/27/2017  . Gastroparesis 07/06/2017  . Orthostatic dizziness 05/12/2016  . Low back pain 05/12/2016  . Chronic constipation 12/17/2015  . Chronic insomnia 12/17/2015  . Left hip pain 12/16/2015  .  Parkinson's disease ([place]) 09/11/2015  . HYPERTENSION 11/21/2008  . MURMUR 11/21/2008  . CHEST PAIN, ATYPICAL 11/21/2008   Deneise Lever, [place], [place] 06/16/2018, [facility] [address] [place], [place], [address] Phone: [phone]   Fax:  [phone]   Name: Nicandro Perrault MRN: [id number] Date of Birth: December 11, 1970

## 2018-06-16 NOTE — Progress Notes (Signed)
Referral made to [facility]

## 2018-06-24 ENCOUNTER — Telehealth: Payer: Self-pay | Admitting: *Deleted

## 2018-06-24 ENCOUNTER — Ambulatory Visit (INDEPENDENT_AMBULATORY_CARE_PROVIDER_SITE_OTHER): Payer: 59 | Admitting: Psychology

## 2018-06-24 ENCOUNTER — Ambulatory Visit: Payer: 59

## 2018-06-24 ENCOUNTER — Ambulatory Visit: Payer: 59 | Admitting: Psychology

## 2018-06-24 DIAGNOSIS — F4321 Adjustment disorder with depressed mood: Secondary | ICD-10-CM | POA: Diagnosis not present

## 2018-06-24 DIAGNOSIS — R471 Dysarthria and anarthria: Secondary | ICD-10-CM | POA: Diagnosis not present

## 2018-06-24 NOTE — Telephone Encounter (Signed)
Pt fmla form on Michelle desk  

## 2018-06-24 NOTE — Therapy (Signed)
[facility] [address] [place], [place], [address] Phone: [phone]   Fax:  [phone]  Speech Language Pathology Treatment  Patient Details  Name: Ruben Silva MRN: [id number] Date of Birth: February 13, 1971 Referring Provider (SLP): Jamey Ripa   Encounter Date: 06/24/2018  End of Session - 06/24/18 0849    Visit Number  11    Number of Visits  17    Date for SLP Re-Evaluation  07/01/18    SLP Start Time  0809    SLP Stop Time   0845    SLP Time Calculation (min)  36 min    Activity Tolerance  Patient tolerated treatment well       Past Medical History:  Diagnosis Date  . Bell's palsy   . Constipation   . Erectile dysfunction   . History of stomach ulcers   . Hypertension   . Insomnia   . Knee pain   . Murmur   . Parkinson disease ([place])   . Parkinsons ([place])   . Weakness generalized     Past Surgical History:  Procedure Laterality Date  . NO PAST SURGERIES      There were no vitals filed for this visit.  Subjective Assessment - 06/24/18 0815    Subjective  "I was here but had to go next door to get FMLA paperwork filled out."    Currently in Pain?  No/denies            ADULT SLP TREATMENT - 06/24/18 0816      General Information   Behavior/Cognition  Alert;Cooperative;Pleasant mood      Treatment Provided   Treatment provided  Cognitive-Linquistic      Cognitive-Linquistic Treatment   Treatment focused on  Dysarthria;Patient/family/caregiver education    Skilled Treatment  "I went to see Dr. Carles Collet the other day and she set me up to talk with somebody." Loud /a/ was used to recalibrate pt conversational loudness to WNL. Average upper 80s/low 90s. SLP req'd to provide min cues usually for counteracting loudness decay.  Conversation between stimuli was intermittently >70dB. Awareness of sub 70dB lacked at times and req'd rare min A for awareness. In dual meaning words pt maintained  loudness >70dB 90% of the time. SLP educated pt on PD websites Boykin Reaper, [place], [place] Vera).      Assessment / Recommendations / Plan   Plan  Continue with current plan of care      Progression Toward Goals   Progression toward goals  Progressing toward goals       SLP Education - 06/24/18 0849    Education Details  websites for people with Parkinson's    Person(s) Educated  Patient    Methods  Explanation;Handout;Demonstration    Comprehension  Verbalized understanding       SLP Short Term Goals - 06/16/18 1721      SLP SHORT TERM GOAL #1   Title  Pt will generate loud /a/ with average of low 90s dB over 4 sessions    Status  Achieved      SLP SHORT TERM GOAL #2   Title  Pt will use speech volume average low 70sdB when responding with sentences 18/20 over 3 sessions    Status  Achieved      SLP SHORT TERM GOAL #3   Title  pt will use abdominal breathing at rest 80% of the time over two sessions  Status  Partially Met      SLP SHORT TERM GOAL #4   Title  in 5-7 minutes simple conversation pt will generate speech volume of low 70s over 3 sessions    Status  Achieved       SLP Long Term Goals - 06/24/18 0851      SLP LONG TERM GOAL #1   Title  Pt will generate loud /a/ with average of low 90s dB over 6 total sessions    Status  Achieved      SLP LONG TERM GOAL #2   Title  pt will use abdominal breathing 75% of the time in 8 minutes mod complex conversation over 3 sessions    Time  3    Period  Weeks    Status  On-going      SLP LONG TERM GOAL #3   Title  in 8 minutes mod-max complex conversation pt will generate speech volume of low 70s over 3 sessions    Time  3    Period  Weeks    Status  On-going      SLP LONG TERM GOAL #4   Title  pt will tell SLP two websites to visit to learn more information about Parkinson's disease    Status  Achieved       Plan - 06/24/18 0849    Clinical  Impression Statement  Pt presented today with mostly-WNL conversational volume when entering treatment room. Pt on his way today to meet with mental health provider who was recommended to pt by Dr. Carles Collet. See "skiled interviention" for further details of session. Pt success today varied in all levels of speech heirarchy (sentence to conversation). Pt remains motivated for change and would cont to benefit from cont'd skilled ST focusing on pt's goal of improving communicative ability with family and in the community.    Speech Therapy Frequency  2x / week    Duration  --   8 weeks or 17 visits   Treatment/Interventions  SLP instruction and feedback;Compensatory strategies;Patient/family education;Internal/external aids;Compensatory techniques;Environmental controls;Cueing hierarchy;Functional tasks    Potential to Achieve Goals  Good       Patient will benefit from skilled therapeutic intervention in order to improve the following deficits and impairments:   Dysarthria and anarthria    Problem List Patient Active Problem List   Diagnosis Date Noted  . Right lumbar radiculopathy 12/27/2017  . Gastroparesis 07/06/2017  . Orthostatic dizziness 05/12/2016  . Low back pain 05/12/2016  . Chronic constipation 12/17/2015  . Chronic insomnia 12/17/2015  . Left hip pain 12/16/2015  . Parkinson's disease ([place]) 09/11/2015  . HYPERTENSION 11/21/2008  . MURMUR 11/21/2008  . CHEST PAIN, ATYPICAL 11/21/2008    [facility] ,MS, CCC-SLP  06/24/2018, 8:52 AM  [facility] [address] [place], [place], [address] Phone: [phone]   Fax:  [phone]   Name: Geovanie Winnett MRN: [id number] Date of Birth: March 16, 1971

## 2018-06-24 NOTE — Patient Instructions (Addendum)
  Parkinson's Websites  [email or web address]  Parkinsonassociation.Trey Sailors [employer]  Earlene Plater phinney foundation  =================================== Please complete the assigned speech therapy homework prior to your next session and return it to the speech therapist at your next visit.

## 2018-06-27 DIAGNOSIS — Z0289 Encounter for other administrative examinations: Secondary | ICD-10-CM

## 2018-06-27 NOTE — Telephone Encounter (Signed)
Completed and returned to medical records. 

## 2018-06-28 ENCOUNTER — Telehealth: Payer: Self-pay | Admitting: Neurology

## 2018-06-28 NOTE — Telephone Encounter (Signed)
Received after hours message stating "Caller states was given patches to try to wean off an RX. They are expensive $40 after insurance. Dr. Charline Bills there could be other options." He wanted to know about coupons or a different medication that would be cheaper. He was given a coupon with the titration pack. (NEUPRO) If this is expensive long term I am not sure that this medication will work for him. He also complained about a deep cough and was instructed to call PCP.   Please advise on Neupro.

## 2018-06-28 NOTE — Telephone Encounter (Signed)
Patient came by needing to see if we had samples for the medication he has started taking. He was advised that the nurse would call him once she talks with Dr. Arbutus Leas. Thanks

## 2018-06-28 NOTE — Telephone Encounter (Signed)
Note sent to Dr. Arbutus Leas. Awaiting response.

## 2018-06-29 NOTE — Telephone Encounter (Signed)
Please call.

## 2018-06-29 NOTE — Telephone Encounter (Signed)
Pt fmla form ready for p/u @ the front desk.  

## 2018-06-29 NOTE — Telephone Encounter (Signed)
Patient did not realize that the coupon was in the Neupro pack. He will try that and see if he can get it cheaper.

## 2018-06-29 NOTE — Telephone Encounter (Signed)
Re: cough.  Needs to f/u with PCP.  Re: $40 per month for neupro.  Did he use the coupon we gave him?  This may be the cost with the coupon.  If so, can he not afford that.  $40 is pretty reasonable for this med, which costs many patients several hundred dollars a month.  If he can afford, I would recommend we stick with this one for a while

## 2018-07-01 ENCOUNTER — Ambulatory Visit: Payer: 59 | Attending: Family Medicine | Admitting: Occupational Therapy

## 2018-07-01 ENCOUNTER — Ambulatory Visit: Payer: 59 | Admitting: Psychology

## 2018-07-01 ENCOUNTER — Ambulatory Visit: Payer: 59

## 2018-07-01 DIAGNOSIS — R293 Abnormal posture: Secondary | ICD-10-CM | POA: Diagnosis present

## 2018-07-01 DIAGNOSIS — R471 Dysarthria and anarthria: Secondary | ICD-10-CM

## 2018-07-01 DIAGNOSIS — F4321 Adjustment disorder with depressed mood: Secondary | ICD-10-CM

## 2018-07-01 DIAGNOSIS — R29898 Other symptoms and signs involving the musculoskeletal system: Secondary | ICD-10-CM | POA: Diagnosis present

## 2018-07-01 DIAGNOSIS — R278 Other lack of coordination: Secondary | ICD-10-CM | POA: Diagnosis present

## 2018-07-01 DIAGNOSIS — R29818 Other symptoms and signs involving the nervous system: Secondary | ICD-10-CM | POA: Diagnosis present
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
## 2018-07-01 NOTE — Therapy (Signed)
[facility] [address] [place], [place], [address] Phone: [phone]   Fax:  [phone]  Occupational Therapy Treatment  Patient Details  Name: Ruben Silva MRN: [id number] Date of Birth: [birth year] No data recorded  Encounter Date: 07/01/2018  OT End of Session - 07/01/18 1127    Visit Number  5    Number of Visits  17    Date for OT Re-Evaluation  07/05/18    Authorization Type  UHC     Authorization - Visit Number  5    Authorization - Number of Visits  24    OT Start Time  1104    OT Stop Time  1145    OT Time Calculation (min)  41 min    Activity Tolerance  Patient tolerated treatment well    Behavior During Therapy  [facility] for tasks assessed/performed       Past Medical History:  Diagnosis Date  . Bell's palsy   . Constipation   . Erectile dysfunction   . History of stomach ulcers   . Hypertension   . Insomnia   . Knee pain   . Murmur   . Parkinson disease (HCC)   . Parkinsons (HCC)   . Weakness generalized     Past Surgical History:  Procedure Laterality Date  . NO PAST SURGERIES      There were no vitals filed for this visit.  Subjective Assessment - 07/01/18 1211    Subjective   pt reports he is going to counseling today    Pertinent History  PD, heart murmur, HTN    Currently in Pain?  Yes    Pain Score  5     Pain Location  Knee    Pain Orientation  Right    Pain Descriptors / Indicators  Aching    Pain Type  Acute pain    Pain Onset  More than a month ago    Pain Frequency  Intermittent    Aggravating Factors   walking    Pain Relieving Factors  knee brace                 Treatment: Pt practiced fastening buttons and writing using adapted strategies, big movments.          OT Education - 07/01/18 1214    Education Details  PWR1 hands basic 4, reinfroced handwriting strategies, multi directional stepping to follow v.c, strategies for fastening  buttons, foam grip for razor and toothbrush    Person(s) Educated  Patient    Methods  Explanation;Demonstration;Handout    Comprehension  Verbalized understanding;Returned demonstration       OT Short Term Goals - 07/01/18 1213      OT SHORT TERM GOAL #1   Title  Independent with PD specific HEP - 06/04/18    Status  On-going      OT SHORT TERM GOAL #2   Title  Pt will verbalize understanding of ways to prevent future complications and verbalize understanding of appropriate community resources related to PD    Status  On-going      OT SHORT TERM GOAL #3   Status  On-going      OT SHORT TERM GOAL #4   Title  Pt will verbalize understanding with writing strategies to help maintain size and legibility    Status  Achieved        OT Long Term Goals - 07/01/18 1213      OT LONG  TERM GOAL #1   Title  Pt will verbalize understanding of adaptive strategies for ADLS/IADLS - 07/05/18    Status  On-going      OT LONG TERM GOAL #2   Title  Pt will improve Lt hand function as evidenced by performing 9 hole peg test in 28 sec. or under    Status  On-going      OT LONG TERM GOAL #3   Title  Pt will button/unbutton 3 buttons in 28 sec. or less    Status  Achieved      OT LONG TERM GOAL #4   Title  Pt will demo ability to write 3 sentences with 90% legibility and no significant decrease in letter size    Status  On-going      OT LONG TERM GOAL #5   Title  Pt reports greater ease with shaving and brushing teeth RUE    Status  On-going            Plan - 07/01/18 1216    Clinical Impression Statement  Pt is progressing towards goals. He demonstrates improved ability to fasten buttons today. Pt demonstrates improved letter size with reinforcement of handwriting strategies.    Occupational Profile and client history currently impacting functional performance  PD since 2016, HTN, heart murmur    Occupational performance deficits (Please refer to evaluation for details):   ADL's;IADL's;Work;Leisure;Social Participation    Rehab Potential  Good    OT Frequency  2x / week    OT Duration  8 weeks    OT Treatment/Interventions  Self-care/ADL training;DME and/or AE instruction;Therapeutic activities;Therapeutic exercise;Cognitive remediation/compensation;Coping strategies training;Neuromuscular education;Passive range of motion;Visual/perceptual remediation/compensation;Manual Therapy;Patient/family education    Plan  check on how shaving is going, and brushing teeth, develop ex chart for patient    OT Home Exercise Plan  PWR! hands, coordination HEP, supine and quadraped PWR!, handwriting strategies, big movement strategies w/ ADLS       Patient will benefit from skilled therapeutic intervention in order to improve the following deficits and impairments:  Decreased coordination, Decreased range of motion, Impaired flexibility, Impaired tone, Improper spinal/pelvic alignment, Impaired UE functional use, Decreased knowledge of use of DME, Decreased mobility, Decreased strength  Visit Diagnosis: Other symptoms and signs involving the nervous system  Other symptoms and signs involving the musculoskeletal system  Other lack of coordination  Abnormal posture    Problem List Patient Active Problem List   Diagnosis Date Noted  . Right lumbar radiculopathy 12/27/2017  . Gastroparesis 07/06/2017  . Orthostatic dizziness 05/12/2016  . Low back pain 05/12/2016  . Chronic constipation 12/17/2015  . Chronic insomnia 12/17/2015  . Left hip pain 12/16/2015  . Parkinson's disease (HCC) 09/11/2015  . HYPERTENSION 11/21/2008  . MURMUR 11/21/2008  . CHEST PAIN, ATYPICAL 11/21/2008    RINE,KATHRYN 07/01/2018, 12:19 PM  [facility] [address] [place], [place], [address] Phone: [phone]   Fax:  [phone]  Name: Ruben Silva MRN: [id number] Date of Birth: 09-08-1970

## 2018-07-05 ENCOUNTER — Encounter: Payer: Self-pay | Admitting: Occupational Therapy

## 2018-07-05 ENCOUNTER — Ambulatory Visit: Payer: 59 | Admitting: Occupational Therapy

## 2018-07-05 ENCOUNTER — Ambulatory Visit: Payer: 59 | Admitting: Speech Pathology

## 2018-07-05 DIAGNOSIS — R293 Abnormal posture: Secondary | ICD-10-CM

## 2018-07-05 DIAGNOSIS — R29818 Other symptoms and signs involving the nervous system: Secondary | ICD-10-CM

## 2018-07-05 DIAGNOSIS — R29898 Other symptoms and signs involving the musculoskeletal system: Secondary | ICD-10-CM

## 2018-07-05 DIAGNOSIS — R471 Dysarthria and anarthria: Secondary | ICD-10-CM

## 2018-07-05 DIAGNOSIS — R278 Other lack of coordination: Secondary | ICD-10-CM

## 2018-07-05 NOTE — Therapy (Signed)
[facility] [address] [place], [place], [address] Phone: [phone]   Fax:  [phone]  Occupational Therapy Treatment  Patient Details  Name: Ruben Silva MRN: [id number] Date of Birth: [birth year] No data recorded  Encounter Date: 07/05/2018  OT End of Session - 07/05/18 1542    Visit Number  6    Number of Visits  17    Date for OT Re-Evaluation  07/05/18    Authorization Type  UHC     Authorization - Visit Number  6    Authorization - Number of Visits  24    OT Start Time  1536    OT Stop Time  1615    OT Time Calculation (min)  39 min    Activity Tolerance  Patient tolerated treatment well    Behavior During Therapy  [facility] for tasks assessed/performed       Past Medical History:  Diagnosis Date  . Bell's palsy   . Constipation   . Erectile dysfunction   . History of stomach ulcers   . Hypertension   . Insomnia   . Knee pain   . Murmur   . Parkinson disease (HCC)   . Parkinsons (HCC)   . Weakness generalized     Past Surgical History:  Procedure Laterality Date  . NO PAST SURGERIES      There were no vitals filed for this visit.  Subjective Assessment - 07/05/18 1639    Subjective   Pt reports knee is still hurting    Pertinent History  PD, heart murmur, HTN    Currently in Pain?  Yes    Pain Score  5     Pain Location  Knee    Pain Orientation  Right    Pain Descriptors / Indicators  Aching    Pain Type  Acute pain    Pain Onset  More than a month ago    Pain Frequency  Intermittent    Aggravating Factors   walking    Pain Relieving Factors  knee brace           Treatment: dynamic step and reach to copy small peg design with alternating UE's, mod v.c for technique, large amplitude movements and correct design. Ambulating while tossing scarf for dual tasking and large amplitude movements, min-mod v.c  Tossing scarves to target with dynamic step and reach , mod v.c for  large amplitude movements andsafety. Reviewed coordination HEP, min v.c then pt returned demonstration.                   OT Short Term Goals - 07/05/18 1541      OT SHORT TERM GOAL #1   Title  Independent with PD specific HEP - 06/04/18    Status  On-going      OT SHORT TERM GOAL #2   Title  Pt will verbalize understanding of ways to prevent future complications and verbalize understanding of appropriate community resources related to PD    Status  On-going      OT SHORT TERM GOAL #3   Title  Pt will improve LUE function as evidenced by increasing Box & Blocks to 48 blocks    Status  On-going      OT SHORT TERM GOAL #4   Title  Pt will verbalize understanding with writing strategies to help maintain size and legibility    Status  Achieved        OT Long Term  Goals - 07/01/18 1213      OT LONG TERM GOAL #1   Title  Pt will verbalize understanding of adaptive strategies for ADLS/IADLS - 07/05/18    Status  On-going      OT LONG TERM GOAL #2   Title  Pt will improve Lt hand function as evidenced by performing 9 hole peg test in 28 sec. or under    Status  On-going      OT LONG TERM GOAL #3   Title  Pt will button/unbutton 3 buttons in 28 sec. or less    Status  Achieved      OT LONG TERM GOAL #4   Title  Pt will demo ability to write 3 sentences with 90% legibility and no significant decrease in letter size    Status  On-going      OT LONG TERM GOAL #5   Title  Pt reports greater ease with shaving and brushing teeth RUE    Status  On-going            Plan - 07/05/18 1545    Clinical Impression Statement  Pt is progressing towards goals. He demonstrates improving bilateral fine motor coordination and understanding of HEP.    Occupational performance deficits (Please refer to evaluation for details):  ADL's;IADL's;Work;Leisure;Social Participation    Rehab Potential  Good    OT Frequency  2x / week    OT Duration  8 weeks    OT  Treatment/Interventions  Self-care/ADL training;DME and/or AE instruction;Therapeutic activities;Therapeutic exercise;Cognitive remediation/compensation;Coping strategies training;Neuromuscular education;Passive range of motion;Visual/perceptual remediation/compensation;Manual Therapy;Patient/family education    Plan  develop exercise chart    Consulted and Agree with Plan of Care  Patient       Patient will benefit from skilled therapeutic intervention in order to improve the following deficits and impairments:  Decreased coordination, Decreased range of motion, Impaired flexibility, Impaired tone, Improper spinal/pelvic alignment, Impaired UE functional use, Decreased knowledge of use of DME, Decreased mobility, Decreased strength  Visit Diagnosis: Other symptoms and signs involving the nervous system  Other symptoms and signs involving the musculoskeletal system  Other lack of coordination  Abnormal posture    Problem List Patient Active Problem List   Diagnosis Date Noted  . Right lumbar radiculopathy 12/27/2017  . Gastroparesis 07/06/2017  . Orthostatic dizziness 05/12/2016  . Low back pain 05/12/2016  . Chronic constipation 12/17/2015  . Chronic insomnia 12/17/2015  . Left hip pain 12/16/2015  . Parkinson's disease (HCC) 09/11/2015  . HYPERTENSION 11/21/2008  . MURMUR 11/21/2008  . CHEST PAIN, ATYPICAL 11/21/2008    RINE,KATHRYN 07/05/2018, 4:40 PM   [facility] [address] [place], [place], [address] Phone: [phone]   Fax:  [phone]  Name: Ruben Silva MRN: [id number] Date of Birth: 06-06-1971

## 2018-07-06 NOTE — Therapy (Signed)
[facility] [address] [place], [place], [address] Phone: [phone]   Fax:  [phone]  Speech Language Pathology Treatment  Patient Details  Name: Ruben Silva MRN: [id number] Date of Birth: 06/20/1971 Referring Provider (SLP): Jamey Ripa   Encounter Date: 07/05/2018  End of Session - 07/05/18 1617    Visit Number  13    Number of Visits  17    Date for SLP Re-Evaluation  07/01/18    SLP Start Time  2951    SLP Stop Time   1700    SLP Time Calculation (min)  45 min       Past Medical History:  Diagnosis Date  . Bell's palsy   . Constipation   . Erectile dysfunction   . History of stomach ulcers   . Hypertension   . Insomnia   . Knee pain   . Murmur   . Parkinson disease ([place])   . Parkinsons ([place])   . Weakness generalized     Past Surgical History:  Procedure Laterality Date  . NO PAST SURGERIES      There were no vitals filed for this visit.  Subjective Assessment - 07/05/18 1617    Subjective  "It seem like some days I'm doing good and some days I'm going backwards."    Currently in Pain?  Yes    Pain Score  6     Pain Location  Knee    Pain Orientation  Right    Pain Descriptors / Indicators  Aching    Pain Type  Acute pain            ADULT SLP TREATMENT - 07/05/18 1615      General Information   Behavior/Cognition  Alert;Cooperative;Pleasant mood      Treatment Provided   Treatment provided  Cognitive-Linquistic      Cognitive-Linquistic Treatment   Treatment focused on  Dysarthria    Skilled Treatment  Initial conversation was sub-WNL; SLP used loud /a/ to recalibrate loudness and effort in conversation. In 2/6 attempts pt required cues to reduce glottal attack, use abdominal breathing. Average 95 dB at 30 cm. SLP worked with pt on abdominal breathing; at rest pt required cues for coordinating inspiration with expansion. Worked with pt until accuracy ~90%.  Progressing to word/phrase level tasks, usual mod A required initially, fading to rare min A. In mod complex conversations (8-10 minutes in length) abdominal effort, vocal intensity was variable, though average remained in low 70s dB.       Assessment / Recommendations / Plan   Plan  Continue with current plan of care      Progression Toward Goals   Progression toward goals  Progressing toward goals         SLP Short Term Goals - 07/05/18 1621      SLP SHORT TERM GOAL #1   Title  Pt will generate loud /a/ with average of low 90s dB over 4 sessions    Status  Achieved      SLP SHORT TERM GOAL #2   Title  Pt will use speech volume average low 70sdB when responding with sentences 18/20 over 3 sessions    Status  Achieved      SLP SHORT TERM GOAL #3   Title  pt will use abdominal breathing at rest 80% of the time over two sessions    Status  Partially Met      SLP SHORT TERM GOAL #4  Title  in 5-7 minutes simple conversation pt will generate speech volume of low 70s over 3 sessions    Status  Achieved       SLP Long Term Goals - 07/06/18 2025      SLP LONG TERM GOAL #1   Title  Pt will generate loud /a/ with average of low 90s dB over 6 total sessions    Status  Achieved      SLP LONG TERM GOAL #2   Title  pt will use abdominal breathing 75% of the time in 8 minutes mod complex conversation over 3 sessions    Time  2    Period  Weeks    Status  On-going      SLP LONG TERM GOAL #3   Title  in 8 minutes mod-max complex conversation pt will generate speech volume of low 70s over 3 sessions    Time  2    Period  Weeks    Status  On-going      SLP LONG TERM GOAL #4   Title  pt will tell SLP two websites to visit to learn more information about Parkinson's disease    Status  Achieved       Plan - 07/06/18 0730    Clinical Impression Statement  Pt presented today with less than WNL conversational volume and hypoarticulation when entering treatment room. See "skilled  intervention" for further details of session. While pt remains motivated for positive outcomes, SLP suspects pt's awareness of need to employ compensations impacting generalization of effort and volume in conversation outside [place] room. Pt would cont to benefit from cont'd skilled ST focusing on pt's goal of improving communicative ability with family and in the community.    Speech Therapy Frequency  2x / week    Duration  --   8 weeks or 17 visits   Treatment/Interventions  SLP instruction and feedback;Compensatory strategies;Patient/family education;Internal/external aids;Compensatory techniques;Environmental controls;Cueing hierarchy;Functional tasks    Potential to Achieve Goals  Good       Patient will benefit from skilled therapeutic intervention in order to improve the following deficits and impairments:   Dysarthria and anarthria    Problem List Patient Active Problem List   Diagnosis Date Noted  . Right lumbar radiculopathy 12/27/2017  . Gastroparesis 07/06/2017  . Orthostatic dizziness 05/12/2016  . Low back pain 05/12/2016  . Chronic constipation 12/17/2015  . Chronic insomnia 12/17/2015  . Left hip pain 12/16/2015  . Parkinson's disease ([place]) 09/11/2015  . HYPERTENSION 11/21/2008  . MURMUR 11/21/2008  . CHEST PAIN, ATYPICAL 11/21/2008   Deneise Lever, [place], [place] Speech-Language Pathologist  Aliene Altes 07/06/2018, 7:34 AM  [facility] [address] [place], [place], [address] Phone: [phone]   Fax:  [phone]   Name: Ruben Silva MRN: [id number] Date of Birth: 05/16/1971

## 2018-07-08 ENCOUNTER — Ambulatory Visit: Payer: 59

## 2018-07-08 ENCOUNTER — Ambulatory Visit: Payer: 59 | Admitting: Psychology

## 2018-07-08 ENCOUNTER — Ambulatory Visit: Payer: 59 | Admitting: Occupational Therapy

## 2018-07-08 DIAGNOSIS — R29818 Other symptoms and signs involving the nervous system: Secondary | ICD-10-CM | POA: Diagnosis not present

## 2018-07-08 DIAGNOSIS — R278 Other lack of coordination: Secondary | ICD-10-CM

## 2018-07-08 DIAGNOSIS — F4321 Adjustment disorder with depressed mood: Secondary | ICD-10-CM

## 2018-07-08 DIAGNOSIS — R471 Dysarthria and anarthria: Secondary | ICD-10-CM

## 2018-07-08 DIAGNOSIS — R29898 Other symptoms and signs involving the musculoskeletal system: Secondary | ICD-10-CM

## 2018-07-08 DIAGNOSIS — R293 Abnormal posture: Secondary | ICD-10-CM

## 2018-07-08 NOTE — Patient Instructions (Signed)
  Please complete the assigned speech therapy homework prior to your next session and return it to the speech therapist at your next visit.  

## 2018-07-08 NOTE — Therapy (Signed)
[facility] [address] [place], [place], [address] Phone: [phone]   Fax:  [phone]  Occupational Therapy Treatment  Patient Details  Name: Ruben Silva MRN: [id number] Date of Birth: [birth year] No data recorded  Encounter Date: 07/08/2018  OT End of Session - 07/08/18 1511    Visit Number  7    Number of Visits  17    Date for OT Re-Evaluation  07/05/18    Authorization Type  UHC     Authorization - Visit Number  7    Authorization - Number of Visits  24    OT Start Time  [id number]    OT Stop Time  0845    OT Time Calculation (min)  39 min       Past Medical History:  Diagnosis Date  . Bell's palsy   . Constipation   . Erectile dysfunction   . History of stomach ulcers   . Hypertension   . Insomnia   . Knee pain   . Murmur   . Parkinson disease (HCC)   . Parkinsons (HCC)   . Weakness generalized     Past Surgical History:  Procedure Laterality Date  . NO PAST SURGERIES      There were no vitals filed for this visit.  Subjective Assessment - 07/08/18 1510    Currently in Pain?  Yes    Pain Score  3     Pain Location  Knee    Pain Orientation  Right    Pain Descriptors / Indicators  Aching    Pain Type  Acute pain;Chronic pain    Pain Onset  More than a month ago    Pain Frequency  Intermittent    Aggravating Factors   walking    Pain Relieving Factors  knee brace                  Treatment: Reviewed HEP and added prone PWR! Exercises.         OT Education - 07/08/18 1512    Education Details  PWR! basic 4 in supine, prone, seated and standing, 10 reps each, min v.c for amplitude and techniques, therapist organized pt exercises for him in a notebook   Person(s) Educated  Patient    Methods  Explanation;Demonstration;Handout    Comprehension  Verbalized understanding;Returned demonstration;Verbal cues required       OT Short Term Goals - 07/08/18 0824       OT SHORT TERM GOAL #1   Title  Independent with PD specific HEP - 06/04/18    Status  Achieved      OT SHORT TERM GOAL #2   Title  Pt will verbalize understanding of ways to prevent future complications and verbalize understanding of appropriate community resources related to PD    Status  Achieved      OT SHORT TERM GOAL #3   Title  Pt will improve LUE function as evidenced by increasing Box & Blocks to 48 blocks    Status  On-going      OT SHORT TERM GOAL #4   Title  Pt will verbalize understanding with writing strategies to help maintain size and legibility    Status  Achieved        OT Long Term Goals - 07/08/18 0825      OT LONG TERM GOAL #1   Title  Pt will verbalize understanding of adaptive strategies for ADLS/IADLS - 07/05/18    Status  On-going      OT LONG TERM GOAL #2   Title  Pt will improve Lt hand function as evidenced by performing 9 hole peg test in 28 sec. or under    Status  On-going      OT LONG TERM GOAL #3   Title  Pt will button/unbutton 3 buttons in 28 sec. or less    Status  Achieved      OT LONG TERM GOAL #4   Title  Pt will demo ability to write 3 sentences with 90% legibility and no significant decrease in letter size    Status  On-going      OT LONG TERM GOAL #5   Title  Pt reports greater ease with shaving and brushing teeth RUE    Status  Achieved            Plan - 07/08/18 1513    Clinical Impression Statement  Pt is progressing towards goals. He demonstrates understanding of PD specific HEP.Anticipate d/c in the next several visits.    Occupational performance deficits (Please refer to evaluation for details):  ADL's;IADL's;Work;Leisure;Social Participation    Rehab Potential  Good    OT Frequency  2x / week    OT Duration  8 weeks    OT Treatment/Interventions  Self-care/ADL training;DME and/or AE instruction;Therapeutic activities;Therapeutic exercise;Cognitive remediation/compensation;Coping strategies training;Neuromuscular  education;Passive range of motion;Visual/perceptual remediation/compensation;Manual Therapy;Patient/family education    Plan  develop exercise chart, check goals, anticipate d/c in next several visits.    Consulted and Agree with Plan of Care  Patient       Patient will benefit from skilled therapeutic intervention in order to improve the following deficits and impairments:  Decreased coordination, Decreased range of motion, Impaired flexibility, Impaired tone, Improper spinal/pelvic alignment, Impaired UE functional use, Decreased knowledge of use of DME, Decreased mobility, Decreased strength  Visit Diagnosis: Other symptoms and signs involving the nervous system  Other symptoms and signs involving the musculoskeletal system  Other lack of coordination  Abnormal posture    Problem List Patient Active Problem List   Diagnosis Date Noted  . Right lumbar radiculopathy 12/27/2017  . Gastroparesis 07/06/2017  . Orthostatic dizziness 05/12/2016  . Low back pain 05/12/2016  . Chronic constipation 12/17/2015  . Chronic insomnia 12/17/2015  . Left hip pain 12/16/2015  . Parkinson's disease (HCC) 09/11/2015  . HYPERTENSION 11/21/2008  . MURMUR 11/21/2008  . CHEST PAIN, ATYPICAL 11/21/2008    RINE,KATHRYN 07/08/2018, 3:15 PM  [facility]ene Breath, OTR/L [phone] Phone: [phone] 3:16 PM 07/08/18 [facility] [address] [place], [place], [address] Phone: [phone]   Fax:  [phone]  Name: Ruben Silva MRN: [id number] Date of Birth: 1971-04-26

## 2018-07-08 NOTE — Therapy (Signed)
[facility] [address] [place], [place], [address] Phone: [phone]   Fax:  [phone]  Speech Language Pathology Treatment  Patient Details  Name: Ruben Silva MRN: [id number] Date of Birth: [birth year] Referring Provider (SLP): Jamey Ripa   Encounter Date: 07/08/2018  End of Session - 07/08/18 1530    Visit Number  14    Number of Visits  17    Date for SLP Re-Evaluation  07/01/18    SLP Start Time  0850    SLP Stop Time   0930    SLP Time Calculation (min)  40 min    Activity Tolerance  Patient tolerated treatment well       Past Medical History:  Diagnosis Date  . Bell's palsy   . Constipation   . Erectile dysfunction   . History of stomach ulcers   . Hypertension   . Insomnia   . Knee pain   . Murmur   . Parkinson disease ([place])   . Parkinsons ([place])   . Weakness generalized     Past Surgical History:  Procedure Laterality Date  . NO PAST SURGERIES      There were no vitals filed for this visit.  Subjective Assessment - 07/08/18 0902    Subjective  "It (the stuffiness) has me low but it's not me." "People are not asking me to repeat as much as before though."    Currently in Pain?  No/denies            ADULT SLP TREATMENT - 07/08/18 0905      General Information   Behavior/Cognition  Alert;Cooperative;Pleasant mood      Treatment Provided   Treatment provided  Cognitive-Linquistic      Cognitive-Linquistic Treatment   Treatment focused on  Dysarthria    Skilled Treatment  Initial conversation with SLP (8 minutes) was variable loudness with average upper 60s dB with min SLP cues. Loud /a/ average mid 90s dB "I'm gonna hit a hundred today, Elfrieda Espino." Pt's 2-sentence responses were average low 70s dB with usual min A for inhibiting loudness decay over the span of the utterance. Conversation between tasks today was average WNL with usual min SLP cues for loudness.       Assessment / Recommendations / Plan   Plan  Continue with current plan of care      Progression Toward Goals   Progression toward goals  Progressing toward goals         SLP Short Term Goals - 07/05/18 1621      SLP SHORT TERM GOAL #1   Title  Pt will generate loud /a/ with average of low 90s dB over 4 sessions    Status  Achieved      SLP SHORT TERM GOAL #2   Title  Pt will use speech volume average low 70sdB when responding with sentences 18/20 over 3 sessions    Status  Achieved      SLP SHORT TERM GOAL #3   Title  pt will use abdominal breathing at rest 80% of the time over two sessions    Status  Partially Met      SLP SHORT TERM GOAL #4   Title  in 5-7 minutes simple conversation pt will generate speech volume of low 70s over 3 sessions    Status  Achieved       SLP Long Term Goals - 07/08/18 1531      SLP LONG TERM GOAL #1  Title  Pt will generate loud /a/ with average of low 90s dB over 6 total sessions    Status  Achieved      SLP LONG TERM GOAL #2   Title  pt will use abdominal breathing 75% of the time in 8 minutes mod complex conversation over 3 sessions    Time  2    Period  Weeks    Status  On-going      SLP LONG TERM GOAL #3   Title  in 8 minutes mod-max complex conversation pt will generate speech volume of low 70s over 3 sessions    Time  2    Period  Weeks    Status  On-going      SLP LONG TERM GOAL #4   Title  pt will tell SLP two websites to visit to learn more information about Parkinson's disease    Status  Achieved       Plan - 07/08/18 1531    Clinical Impression Statement  Pt presented today with less than WNL conversational volume and hypoarticulation when entering treatment room. See "skilled intervention" for further details of session. While pt remains motivated for positive outcomes, SLP suspects pt's awareness of need to employ compensations impacting generalization of effort and volume in conversation outside [place] room. Pt would  cont to benefit from cont'd skilled ST focusing on pt's goal of improving communicative ability with family and in the community.    Speech Therapy Frequency  2x / week    Duration  --   8 weeks or 17 visits   Treatment/Interventions  SLP instruction and feedback;Compensatory strategies;Patient/family education;Internal/external aids;Compensatory techniques;Environmental controls;Cueing hierarchy;Functional tasks    Potential to Achieve Goals  Good       Patient will benefit from skilled therapeutic intervention in order to improve the following deficits and impairments:   Dysarthria and anarthria    Problem List Patient Active Problem List   Diagnosis Date Noted  . Right lumbar radiculopathy 12/27/2017  . Gastroparesis 07/06/2017  . Orthostatic dizziness 05/12/2016  . Low back pain 05/12/2016  . Chronic constipation 12/17/2015  . Chronic insomnia 12/17/2015  . Left hip pain 12/16/2015  . Parkinson's disease ([place]) 09/11/2015  . HYPERTENSION 11/21/2008  . MURMUR 11/21/2008  . CHEST PAIN, ATYPICAL 11/21/2008    [facility] ,MS, CCC-SLP  07/08/2018, 3:31 PM  [facility] [address] [place], [place], [address] Phone: [phone]   Fax:  [phone]   Name: Ruben Silva MRN: [id number] Date of Birth: 1971-05-19

## 2018-07-11 ENCOUNTER — Telehealth: Payer: Self-pay | Admitting: Neurology

## 2018-07-11 MED ORDER — FLUTICASONE PROPIONATE 50 MCG/ACT NA SUSP: NASAL | 1 refills | Status: DC

## 2018-07-11 NOTE — Telephone Encounter (Signed)
The flonase should take care of this

## 2018-07-11 NOTE — Telephone Encounter (Signed)
Spoke with patient.  He states the patch bothers him in the shower, he feels like his skin is really irritated. I advised to use his shower time to be the time to change the patches, and to wash the skin really well. I advised if skin is bothering him he could try flonase spray and he asked me to send this in which I will do.  He also states he read all the side effects of Neupro and wants to know if there is a different patch available for PD. He doesn't want anymore oral medication. He hasn't developed any side effects, but he didn't like what he read and wanted to switch. Made him aware this is the only patch available. He will remain on it and let us know if he develops any side effects.  Dr. Arbutus Leas Lorain Childes.

## 2018-07-11 NOTE — Telephone Encounter (Signed)
Patient states he would like to talk to someone about the patches that Dr Tat gave him, the side effects and also they are hurting his skin when he is in the shower. Please call

## 2018-07-13 ENCOUNTER — Ambulatory Visit: Payer: 59

## 2018-07-13 ENCOUNTER — Ambulatory Visit: Payer: 59 | Admitting: Occupational Therapy

## 2018-07-13 DIAGNOSIS — R29818 Other symptoms and signs involving the nervous system: Secondary | ICD-10-CM | POA: Diagnosis not present

## 2018-07-13 DIAGNOSIS — R29898 Other symptoms and signs involving the musculoskeletal system: Secondary | ICD-10-CM

## 2018-07-13 DIAGNOSIS — R471 Dysarthria and anarthria: Secondary | ICD-10-CM

## 2018-07-13 DIAGNOSIS — R278 Other lack of coordination: Secondary | ICD-10-CM

## 2018-07-13 DIAGNOSIS — R293 Abnormal posture: Secondary | ICD-10-CM

## 2018-07-13 NOTE — Patient Instructions (Signed)
  Please complete the assigned speech therapy homework prior to your next session and return it to the speech therapist at your next visit.  

## 2018-07-13 NOTE — Therapy (Signed)
[facility] [address] [place], [place], [address] Phone: [phone]   Fax:  [phone]  Occupational Therapy Treatment  Patient Details  Name: Ruben Silva MRN: [id number] Date of Birth: [birth year] No data recorded  Encounter Date: 07/13/2018  OT End of Session - 07/13/18 1601    Visit Number  8    Number of Visits  17    Date for OT Re-Evaluation  07/05/18    Authorization Type  [employer]     Authorization - Visit Number  8    Authorization - Number of Visits  24    OT Start Time  1779    OT Stop Time  1555    OT Time Calculation (min)  25 min    Activity Tolerance  Patient tolerated treatment well    Behavior During Therapy  [facility] for tasks assessed/performed       Past Medical History:  Diagnosis Date  . Bell's palsy   . Constipation   . Erectile dysfunction   . History of stomach ulcers   . Hypertension   . Insomnia   . Knee pain   . Murmur   . Parkinson disease ([place])   . Parkinsons ([place])   . Weakness generalized     Past Surgical History:  Procedure Laterality Date  . NO PAST SURGERIES      There were no vitals filed for this visit.  Subjective Assessment - 07/13/18 1532    Subjective   Pt reports knee is still hurting    Pertinent History  PD, heart murmur, HTN    Currently in Pain?  Yes    Pain Score  9     Pain Location  Knee    Pain Orientation  Right    Pain Descriptors / Indicators  Aching    Pain Type  Chronic pain    Pain Onset  More than a month ago    Pain Frequency  Intermittent    Aggravating Factors   walking , movement    Pain Relieving Factors  knee brace    Multiple Pain Sites  No                           OT Education - 07/13/18 1605    Education Details  PD aware and care kit info,reviewed importance of big movments with ADLs with patient, importance of avoiding activities that cause knee pain, therapist checked progress towards goals and  reviewed with patient    Person(s) Educated  Patient    Methods  Explanation;Demonstration;Handout    Comprehension  Verbalized understanding;Returned demonstration;Verbal cues required       OT Short Term Goals - 07/13/18 1545      OT SHORT TERM GOAL #1   Title  Independent with PD specific HEP - 06/04/18    Status  Achieved      OT SHORT TERM GOAL #2   Title  Pt will verbalize understanding of ways to prevent future complications and verbalize understanding of appropriate community resources related to PD    Status  Achieved      OT SHORT TERM GOAL #3   Title  Pt will improve LUE function as evidenced by increasing Box & Blocks to 48 blocks    Status  Achieved   54 blocks     OT SHORT TERM GOAL #4   Title  Pt will verbalize understanding with writing strategies to help maintain  size and legibility    Status  Achieved        OT Long Term Goals - 07/13/18 1548      OT LONG TERM GOAL #1   Title  Pt will verbalize understanding of adaptive strategies for ADLS/IADLS - 82/4/23    Status  Achieved      OT LONG TERM GOAL #2   Title  Pt will improve Lt hand function as evidenced by performing 9 hole peg test in 28 sec. or under    Status  Achieved   25.41 secs     OT LONG TERM GOAL #3   Title  Pt will button/unbutton 3 buttons in 28 sec. or less    Status  Achieved      OT LONG TERM GOAL #4   Title  Pt will demo ability to write 3 sentences with 90% legibility and no significant decrease in letter size    Status  Achieved      OT LONG TERM GOAL #5   Title  Pt reports greater ease with shaving and brushing teeth RUE    Status  Achieved            Plan - 07/13/18 1602    Clinical Impression Statement  Pt demonstrates good overall progress. Pt achieved all long and short term goals. He agrees with plans for d/c.    Occupational Profile and client history currently impacting functional performance  PD since 2016, HTN, heart murmur    Occupational performance deficits  (Please refer to evaluation for details):  ADL's;IADL's;Work;Leisure;Social Participation    Rehab Potential  Good    OT Frequency  2x / week    OT Duration  8 weeks    OT Treatment/Interventions  Self-care/ADL training;DME and/or AE instruction;Therapeutic activities;Therapeutic exercise;Cognitive remediation/compensation;Coping strategies training;Neuromuscular education;Passive range of motion;Visual/perceptual remediation/compensation;Manual Therapy;Patient/family education    Plan  develop exercise chart, check goals, anticipate d/c in next several visits.    OT Home Exercise Plan  PWR! hands, coordination HEP, supine and seated PWR!, handwriting strategies, big movement strategies w/ ADLS    Consulted and Agree with Plan of Care  Patient       Patient will benefit from skilled therapeutic intervention in order to improve the following deficits and impairments:  Decreased coordination, Decreased range of motion, Impaired flexibility, Impaired tone, Improper spinal/pelvic alignment, Impaired UE functional use, Decreased knowledge of use of DME, Decreased mobility, Decreased strength  Visit Diagnosis: Other symptoms and signs involving the nervous system  Other symptoms and signs involving the musculoskeletal system  Other lack of coordination  Abnormal posture   OCCUPATIONAL THERAPY DISCHARGE SUMMARY  Current functional level related to goals / functional outcomes: Pt achieved all goals   Remaining deficits: Bradykinesia, abnormal posture, decreased balance, decreased coordination   Education / Equipment: Pt was educated regarding: community resources ways to prevent future complications, HEP, and  adapted strategies for ADLS . Pt verbalized understanding of all education. Plan: Patient agrees to discharge.  Patient goals were met. Patient is being discharged due to meeting the stated rehab goals.  ?????      Problem List Patient Active Problem List   Diagnosis Date Noted   . Right lumbar radiculopathy 12/27/2017  . Gastroparesis 07/06/2017  . Orthostatic dizziness 05/12/2016  . Low back pain 05/12/2016  . Chronic constipation 12/17/2015  . Chronic insomnia 12/17/2015  . Left hip pain 12/16/2015  . Parkinson's disease ([place]) 09/11/2015  . HYPERTENSION 11/21/2008  . MURMUR 11/21/2008  .  CHEST PAIN, ATYPICAL 11/21/2008    Lakshmi Sundeen 07/13/2018, 4:08 PM Theone Murdoch, OTR/L [phone] Phone: [phone] 4:09 PM 07/13/18 [facility] [address] [place], [place], [address] Phone: [phone]   Fax:  [phone]  Name: Ruben Silva MRN: [id number] Date of Birth: 1971/01/25

## 2018-07-13 NOTE — Therapy (Signed)
[facility] [address] [place], [place], [address] Phone: [phone]   Fax:  [phone]  Speech Language Pathology Treatment  Patient Details  Name: Ruben Silva MRN: [id number] Date of Birth: 08-24-1971 Referring Provider (SLP): Jamey Ripa   Encounter Date: 07/13/2018  End of Session - 07/13/18 1555    Visit Number  15    Number of Visits  17    Date for SLP Re-Evaluation  07/01/18    SLP Start Time  1451   pt 4 minutes late   SLP Stop Time   1530    SLP Time Calculation (min)  39 min    Activity Tolerance  Patient tolerated treatment well       Past Medical History:  Diagnosis Date  . Bell's palsy   . Constipation   . Erectile dysfunction   . History of stomach ulcers   . Hypertension   . Insomnia   . Knee pain   . Murmur   . Parkinson disease ([place])   . Parkinsons ([place])   . Weakness generalized     Past Surgical History:  Procedure Laterality Date  . NO PAST SURGERIES      There were no vitals filed for this visit.  Subjective Assessment - 07/13/18 1457    Subjective  "My voice sounds pretty good, huh Lekia Nier?"    Currently in Pain?  Yes    Pain Score  9     Pain Location  Knee    Pain Orientation  Right    Pain Descriptors / Indicators  Aching    Pain Type  Chronic pain    Pain Onset  More than a month ago    Pain Frequency  Intermittent    Aggravating Factors   walking, movement    Pain Relieving Factors  knee brace            ADULT SLP TREATMENT - 07/13/18 1500      General Information   Behavior/Cognition  Alert;Cooperative;Pleasant mood      Treatment Provided   Treatment provided  Cognitive-Linquistic      Cognitive-Linquistic Treatment   Treatment focused on  Dysarthria    Skilled Treatment  Initial conversation with SLP with widely variable loudness, from mid 60s to mid 70s dB, average upper 60s dB over 3 minutes. SLP used loud /a/ for reclibration of  conversational loudness with average low-mid 90s dB. Loud /a/ was used to incr conversational speech volume to WNL with average in low 90s dB. In conversation afterwards pt cont'd less-widely variable but still averaged in upper 60s dB. SLP redirected pt to 3-sentence responses and told pt rationale for why he needed to use louder speech with "harder" responses. After this, pt able to maintain lower 70s dB in next 5 responses of 2-4 sentences. SLP provided pt homework for 2-4 sentences and enocuraged him to complete.       Assessment / Recommendations / Plan   Plan  Continue with current plan of care      Progression Toward Goals   Progression toward goals  Progressing toward goals         SLP Short Term Goals - 07/05/18 1621      SLP SHORT TERM GOAL #1   Title  Pt will generate loud /a/ with average of low 90s dB over 4 sessions    Status  Achieved      SLP SHORT TERM GOAL #2   Title  Pt  will use speech volume average low 70sdB when responding with sentences 18/20 over 3 sessions    Status  Achieved      SLP SHORT TERM GOAL #3   Title  pt will use abdominal breathing at rest 80% of the time over two sessions    Status  Partially Met      SLP SHORT TERM GOAL #4   Title  in 5-7 minutes simple conversation pt will generate speech volume of low 70s over 3 sessions    Status  Achieved       SLP Long Term Goals - 07/13/18 1600      SLP LONG TERM GOAL #1   Title  Pt will generate loud /a/ with average of low 90s dB over 6 total sessions    Status  Achieved      SLP LONG TERM GOAL #2   Title  pt will use abdominal breathing 65% of the time in 8 minutes conversation over 3 sessions    Time  2    Period  Weeks    Status  Revised      SLP LONG TERM GOAL #3   Title  in 8 minutes mod complex conversation pt will generate speech volume of low 70s over 3 sessions    Time  2    Period  Weeks    Status  Revised      SLP LONG TERM GOAL #4   Title  pt will tell SLP two websites to  visit to learn more information about Parkinson's disease    Status  Achieved       Plan - 07/13/18 1556    Clinical Impression Statement  Pt, again, presented today with less than WNL conversational volume and hypoarticulation when entering treatment room. See "skilled intervention" for further details of session. Pt was better with 2-4 sentence responses after SLP telling pt rationale for louder speech in "harder" responses. While pt remains motivated for positive outcomes, Pt would cont to benefit from cont'd skilled ST focusing on pt's goal of improving communicative ability with family and in the community.    Speech Therapy Frequency  2x / week    Duration  --   8 weeks or 17 visits   Treatment/Interventions  SLP instruction and feedback;Compensatory strategies;Patient/family education;Internal/external aids;Compensatory techniques;Environmental controls;Cueing hierarchy;Functional tasks    Potential to Achieve Goals  Good       Patient will benefit from skilled therapeutic intervention in order to improve the following deficits and impairments:   Dysarthria and anarthria    Problem List Patient Active Problem List   Diagnosis Date Noted  . Right lumbar radiculopathy 12/27/2017  . Gastroparesis 07/06/2017  . Orthostatic dizziness 05/12/2016  . Low back pain 05/12/2016  . Chronic constipation 12/17/2015  . Chronic insomnia 12/17/2015  . Left hip pain 12/16/2015  . Parkinson's disease ([place]) 09/11/2015  . HYPERTENSION 11/21/2008  . MURMUR 11/21/2008  . CHEST PAIN, ATYPICAL 11/21/2008    [facility] ,MS, CCC-SLP  07/13/2018, 4:01 PM  [facility] [address] [place], [place], [address] Phone: [phone]   Fax:  [phone]   Name: Ruben Silva MRN: [id number] Date of Birth: 13-May-1971

## 2018-07-20 ENCOUNTER — Encounter: Payer: 59 | Admitting: Occupational Therapy

## 2018-07-20 ENCOUNTER — Ambulatory Visit: Payer: 59

## 2018-07-20 DIAGNOSIS — R471 Dysarthria and anarthria: Secondary | ICD-10-CM

## 2018-07-20 DIAGNOSIS — R29818 Other symptoms and signs involving the nervous system: Secondary | ICD-10-CM | POA: Diagnosis not present

## 2018-07-20 NOTE — Therapy (Signed)
[facility] [address] [place], [place], [address] Phone: [phone]   Fax:  [phone]  Speech Language Pathology Treatment  Patient Details  Name: Ruben Silva MRN: [id number] Date of Birth: Jan 11, 1971 Referring Provider (SLP): Jamey Ripa   Encounter Date: 07/20/2018  End of Session - 07/20/18 1634    Visit Number  16    Number of Visits  17    Date for SLP Re-Evaluation  07/01/18    SLP Start Time  4259   pt in restroom at 1532   SLP Stop Time   5638    SLP Time Calculation (min)  39 min    Activity Tolerance  Patient tolerated treatment well       Past Medical History:  Diagnosis Date  . Bell's palsy   . Constipation   . Erectile dysfunction   . History of stomach ulcers   . Hypertension   . Insomnia   . Knee pain   . Murmur   . Parkinson disease ([place])   . Parkinsons ([place])   . Weakness generalized     Past Surgical History:  Procedure Laterality Date  . NO PAST SURGERIES      There were no vitals filed for this visit.  Subjective Assessment - 07/20/18 1546    Subjective  "I'm talking loud today aren't I Ruben Silva."    Currently in Pain?  Yes    Pain Score  2     Pain Location  Knee    Pain Orientation  Right    Pain Descriptors / Indicators  Aching    Pain Onset  More than a month ago    Pain Frequency  Intermittent            ADULT SLP TREATMENT - 07/20/18 1550      General Information   Behavior/Cognition  Alert;Cooperative;Pleasant mood      Treatment Provided   Treatment provided  Cognitive-Linquistic      Cognitive-Linquistic Treatment   Treatment focused on  Dysarthria    Skilled Treatment  Initial conversation (mod complex) with SLP for 15 minutes with average 70dB, wihtout the widely variable loudness as in previous session. Loud /a/ was then used to recalibrate pt's conversational speech to WNL, with average upper 90s - low 100s dB. With structured speech  tasks, WNL loudness (low 70s dB) was produced 95% of the time. Concersation out of doors was completed with 100% intelligibilty and adequate loudness. During conversation after SLP mentioned abdominal breathing, pt use increased to approx 65% of the time. Pt expressed he was greatly pleased with this session.       Assessment / Recommendations / Plan   Plan  Continue with current plan of care   possible d/c next session(?)     Progression Toward Goals   Progression toward goals  Progressing toward goals         SLP Short Term Goals - 07/05/18 1621      SLP SHORT TERM GOAL #1   Title  Pt will generate loud /a/ with average of low 90s dB over 4 sessions    Status  Achieved      SLP SHORT TERM GOAL #2   Title  Pt will use speech volume average low 70sdB when responding with sentences 18/20 over 3 sessions    Status  Achieved      SLP SHORT TERM GOAL #3   Title  pt will use abdominal breathing at rest 80%  of the time over two sessions    Status  Partially Met      SLP SHORT TERM GOAL #4   Title  in 5-7 minutes simple conversation pt will generate speech volume of low 70s over 3 sessions    Status  Achieved       SLP Long Term Goals - 07/20/18 1550      SLP LONG TERM GOAL #1   Title  Pt will generate loud /a/ with average of low 90s dB over 6 total sessions    Status  Achieved      SLP LONG TERM GOAL #2   Title  pt will use abdominal breathing 65% of the time in 8 minutes conversation over 3 sessions    Time  1    Period  Weeks    Status  Revised      SLP LONG TERM GOAL #3   Title  in 8 minutes mod complex conversation pt will generate speech volume of low 70s over 3 sessions    Baseline  07-20-18    Time  1    Period  Weeks    Status  Revised      SLP LONG TERM GOAL #4   Title  pt will tell SLP two websites to visit to learn more information about Parkinson's disease    Status  Achieved       Plan - 07/20/18 1634    Clinical Impression Statement  Pt, presented  today with WNL conversational volume when entering treatment room today, and out of doors. In structured tasks pt average loudness was low 70s dB. See "skilled intervention" for further details of session.  Pt would cont to benefit from cont'd skilled ST focusing on pt's goal of improving communicative ability with family and in the community. Discharge may occur next session.    Speech Therapy Frequency  2x / week    Duration  --   8 weeks or 17 visits   Treatment/Interventions  SLP instruction and feedback;Compensatory strategies;Patient/family education;Internal/external aids;Compensatory techniques;Environmental controls;Cueing hierarchy;Functional tasks    Potential to Achieve Goals  Good       Patient will benefit from skilled therapeutic intervention in order to improve the following deficits and impairments:   Dysarthria and anarthria    Problem List Patient Active Problem List   Diagnosis Date Noted  . Right lumbar radiculopathy 12/27/2017  . Gastroparesis 07/06/2017  . Orthostatic dizziness 05/12/2016  . Low back pain 05/12/2016  . Chronic constipation 12/17/2015  . Chronic insomnia 12/17/2015  . Left hip pain 12/16/2015  . Parkinson's disease ([place]) 09/11/2015  . HYPERTENSION 11/21/2008  . MURMUR 11/21/2008  . CHEST PAIN, ATYPICAL 11/21/2008    [facility] ,MS, CCC-SLP  07/20/2018, 4:38 PM  [facility] [address] [place], [place], [address] Phone: [phone]   Fax:  [phone]   Name: Ruben Silva MRN: [id number] Date of Birth: [birth year]

## 2018-07-22 ENCOUNTER — Ambulatory Visit: Payer: 59 | Admitting: Psychology

## 2018-07-22 ENCOUNTER — Ambulatory Visit: Payer: 59 | Admitting: Occupational Therapy

## 2018-07-22 ENCOUNTER — Ambulatory Visit: Payer: 59

## 2018-07-22 DIAGNOSIS — R29818 Other symptoms and signs involving the nervous system: Secondary | ICD-10-CM | POA: Diagnosis not present

## 2018-07-22 DIAGNOSIS — R471 Dysarthria and anarthria: Secondary | ICD-10-CM

## 2018-07-22 NOTE — Patient Instructions (Signed)
Continue with loud "ah" 5x, twice daily.  If your voice has pain after "ah" reduce your effort level to approx 8.5-9/10 instead of 9.5-10/10.

## 2018-07-22 NOTE — Therapy (Signed)
[facility] [address] [place], [place], [address] Phone: [phone]   Fax:  [phone]  Speech Language Pathology Treatment  Patient Details  Name: Ruben Silva MRN: [id number] Date of Birth: Apr 13, 1971 Referring Provider (SLP): Jamey Ripa   Encounter Date: 07/22/2018  End of Session - 07/22/18 1005    Visit Number  17    Number of Visits  17    Date for SLP Re-Evaluation  07/01/18    SLP Start Time  0850    SLP Stop Time   0930    SLP Time Calculation (min)  40 min    Activity Tolerance  Patient tolerated treatment well       Past Medical History:  Diagnosis Date  . Bell's palsy   . Constipation   . Erectile dysfunction   . History of stomach ulcers   . Hypertension   . Insomnia   . Knee pain   . Murmur   . Parkinson disease ([place])   . Parkinsons ([place])   . Weakness generalized     Past Surgical History:  Procedure Laterality Date  . NO PAST SURGERIES      There were no vitals filed for this visit.  Subjective Assessment - 07/22/18 0854    Subjective  Pt reports people at work no longer ask him to repeat and are not "leaning in" to hear him.     Currently in Pain?  No/denies            ADULT SLP TREATMENT - 07/22/18 0924      General Information   Behavior/Cognition  Alert;Cooperative;Pleasant mood      Treatment Provided   Treatment provided  Cognitive-Linquistic      Cognitive-Linquistic Treatment   Treatment focused on  Dysarthria    Skilled Treatment  Initial conversation (min-mod complex) with SLP for 10 minutes with average 70dB, wihtout widely variable loudness. Loud /a/ was then used to recalibrate pt's conversational speech to WNL, with average upper 90s dB. SLP cautioned pt ifhe should have vocal pain he should "click it down a notch or two with effort." With structured sentence/multi-sentence speech tasks, WNL loudness average was low 70s db. Conversation was  then generated again by pt with WNL loudness. Pt used abdominal breathing in this final conversation approx 60% of the time. Pt told SLP he is satisfied with current skill level and agrees with d/c today.       Assessment / Recommendations / Plan   Plan  Discharge SLP treatment due to (comment)   pt satisfied with current level     Progression Toward Goals   Progression toward goals  --   d/c day- see goals      SLP Education - 07/22/18 1005    Education Details  if vocal pain, then reduce loudness/effort    Person(s) Educated  Patient    Methods  Explanation    Comprehension  Verbalized understanding       SLP Short Term Goals - 07/05/18 1621      SLP SHORT TERM GOAL #1   Title  Pt will generate loud /a/ with average of low 90s dB over 4 sessions    Status  Achieved      SLP SHORT TERM GOAL #2   Title  Pt will use speech volume average low 70sdB when responding with sentences 18/20 over 3 sessions    Status  Achieved      SLP SHORT TERM GOAL #3  Title  pt will use abdominal breathing at rest 80% of the time over two sessions    Status  Partially Met      SLP SHORT TERM GOAL #4   Title  in 5-7 minutes simple conversation pt will generate speech volume of low 70s over 3 sessions    Status  Achieved       SLP Long Term Goals - 07/22/18 1008      SLP LONG TERM GOAL #1   Title  Pt will generate loud /a/ with average of low 90s dB over 6 total sessions    Status  Achieved      SLP LONG TERM GOAL #2   Title  pt will use abdominal breathing 65% of the time in 8 minutes conversation over 3 sessions    Status  Partially Met   60-70% in two sessions     SLP LONG TERM GOAL #3   Title  in 8 minutes mod complex conversation pt will generate speech volume of low 70s over 3 sessions    Status  Partially Met   2/3 sessions     SLP LONG TERM GOAL #4   Title  pt will tell SLP two websites to visit to learn more information about Parkinson's disease    Status  Achieved        Plan - 07/22/18 1006    Clinical Impression Statement  Pt, presented today with WNL conversational volume when entering treatment room today, and after loud /a/ and structured tasks. He appears to have remained consistent since last session. Pt again reports he is satisfied with current skill level and his pleasure in the therapy process thus far. He reports he doesn't have people at work ask him to repeat anymore. Pt should be scheduled for PD screen in approx 6 months to assess maintenance of WNL speech, and to monitor pt's swallowing status.     Treatment/Interventions  SLP instruction and feedback;Compensatory strategies;Patient/family education;Internal/external aids;Compensatory techniques;Environmental controls;Cueing hierarchy;Functional tasks    Potential to Achieve Goals  Good       Patient will benefit from skilled therapeutic intervention in order to improve the following deficits and impairments:   Dysarthria and anarthria   SPEECH THERAPY DISCHARGE SUMMARY  Visits from Start of Care: 17  Current functional level related to goals / functional outcomes: Pt partially met/met all LTGs. He reports people at work no longer ask him to repeat himself or "lean in" to understand him. He has reported in the past two sessions his satisfaction with his current progress.   Remaining deficits: Mild dysarthria - suspected moreso when pt fatigued. SLP has noted what could likely be some mild cognitive linguistic impairment with judgement and safety/insight into deficits versus pt'st personality.   Education / Equipment: Need to perform loud /a/ daily, may need to decr effort level if feels pain in voice after loud /a/   Plan: Patient agrees to discharge.  Patient goals were partially met. Patient is being discharged due to being pleased with the current functional level.  ?????Pt should return in approx 6 months for follow up PD screens to monitor maintenance of areas of ST  (voice/dysarthria, cognitive linguistics, and swallowing).       Problem List Patient Active Problem List   Diagnosis Date Noted  . Right lumbar radiculopathy 12/27/2017  . Gastroparesis 07/06/2017  . Orthostatic dizziness 05/12/2016  . Low back pain 05/12/2016  . Chronic constipation 12/17/2015  . Chronic insomnia 12/17/2015  . Left  hip pain 12/16/2015  . Parkinson's disease ([place]) 09/11/2015  . HYPERTENSION 11/21/2008  . MURMUR 11/21/2008  . CHEST PAIN, ATYPICAL 11/21/2008    [facility] ,MS, CCC-SLP  07/22/2018, 10:09 AM  [facility] [address] [place], [place], [address] Phone: [phone]   Fax:  [phone]   Name: Ruben Silva MRN: [id number] Date of Birth: 1971-03-26

## 2018-07-25 ENCOUNTER — Ambulatory Visit: Payer: 59 | Admitting: Speech Pathology

## 2018-08-04 ENCOUNTER — Other Ambulatory Visit: Payer: Self-pay | Admitting: Neurology

## 2018-08-04 ENCOUNTER — Other Ambulatory Visit: Payer: Self-pay

## 2018-08-04 MED ORDER — CLONAZEPAM 1 MG PO TABS: 1.0000 mg | ORAL_TABLET | Freq: Every day | ORAL | 0 refills | Status: DC

## 2018-08-04 NOTE — Telephone Encounter (Signed)
He was transferring care from Dr. Terrace ArabiaYan, who was RX.  Tell him I don't prescribe high dose klonopin.  I will give him 1 mg but not 2 mg.  1 mg is higher than I generally give.  Most of my PD patients, if they are on it, are on 0.25 mg-0.5 mg

## 2018-08-04 NOTE — Telephone Encounter (Signed)
Patient is okay with Clonazepam 1 mg at bedtime. He is aware you will not prescribe greater than this dosage. Please sign RX.

## 2018-08-04 NOTE — Telephone Encounter (Signed)
Patient would like a refill on the Clonazepam called into the [facility] on [address]. He has not slept all night and needs this medication to help him sleep

## 2018-08-04 NOTE — Telephone Encounter (Signed)
I don't believe you manage this medication. Please advise.

## 2018-08-05 ENCOUNTER — Ambulatory Visit: Payer: 59 | Admitting: Psychology

## 2018-08-05 DIAGNOSIS — F4321 Adjustment disorder with depressed mood: Secondary | ICD-10-CM

## 2018-08-05 MED ORDER — CLONAZEPAM 1 MG PO TABS: 1.0000 mg | ORAL_TABLET | Freq: Every day | ORAL | 0 refills | Status: DC

## 2018-08-15 NOTE — Progress Notes (Deleted)
Ruben Silva was seen today in the movement disorders clinic for neurologic consultation at the request of Ruben Silva.  The consultation is for the evaluation of Parkinson's disease.  Prior neurology records are reviewed.  Patient has been under the care of Ruben Silva.  Patient symptoms started in October, 2015 with gait change.  He presented to Ruben Silva in June, 2016 with slow, shuffling gait and tremors.  His initial starting dose of levodopa was "up to 2 tablets 3 times per day."  Levodopa did help.  He was referred to Ruben Silva in September, 2016.  He has been following both at [facility] as well as [facility].  At that time, Ruben Silva felt a levodopa sparing strategy was best, but did not change his levodopa.  He did recommend Neupro, but did not start it.  Patient was already on clonazepam and this was increased by Ruben Silva to 1 mg nightly.  He did try ropinirole in early 2017 but was not able to tolerate it because of dizziness and GI side effects.  At some point this is reintroduced (mid 2017) but seems to cause GI upset again.  Azilect was initiated but ultimately discontinued because of GI issues.  In June, 2017 he was changed from carbidopa/levodopa 25/100 to Stalevo.  He is currently on Stalevo 200, 1 tablet 5 times per day.  He is also on clonazepam, 2 mg at night.  He is on Mestinon, 60 mg 3 times per day.  He was last seen by Ruben Silva on June 02, 2018 at which point he was referred to Dr. Fidela Silva.  He does have an appointment with [name], but this was with Dr. Clovis Silva on August 15, 2018  He last saw Dr. Westley Silva in September, 2018.  However, he has been participating in a sublingual apomorphine study.  He has an appointment with Ruben Silva next month.   Specific Symptoms:  Tremor: No. Family hx of similar:  No. Voice: gotten weaker.  Currently doing voice therapy Sleep: trouble sleeping and taking klonopin.  He takes it 3-4 times per week as "I don't want to be  addicted."  Vivid Dreams:  No.  Acting out dreams:  No. Wet Pillows: No. Postural symptoms:  Yes.    Falls?  No. Bradykinesia symptoms: shuffling gait Loss of smell:  No. Loss of taste:  No. Urinary Incontinence:  No.  Difficulty Swallowing:  No. Handwriting, micrographia: Yes.   Trouble with ADL's:  No.  Trouble buttoning clothing: No. Depression:  Yes.   (separated from wife x 7-8 months; "its taken a toll.").  19y/o son lives with him and pt pays car insurance, etc.  Son just started working Memory changes:  No. Hallucinations:  No.  visual distortions: No. N/V:  No. Lightheaded:  No.  Syncope: No. Diplopia:  Rarely if starting at the computer too long Dyskinesia:  No. Prior exposure to reglan/antipsychotics: No.  MRI of the brain was completed in June, 2016.  I have reviewed this and it was normal.  He also had an MRI of the cervical spine in 2016 which was essentially unremarkable.  08/17/18 update: Patient is seen today in follow-up for Parkinson's disease.  I discontinued his Mestinon last visit, due to GI upset and the fact that he was placed on antihypertensives.  He reports that GI upset has been ***.  I started the patient on the Neupro patch and ***.  No compulsive behaviors.  No sleep attacks.  He is  also on Stalevo 200, 1 tablet 5 times per day.  No falls.  No lightheadedness or near syncope.  He did call here since last visit, needing a refill on his clonazepam which he was taking for insomnia.  He was on 2 mg daily.  We discussed that this was a higher dose than I felt comfortable with.  I did give him a refill, but only of 1 mg daily.  Last visit, the patient talked a lot about depression and we give him counseling resources.  He reports today that ***.  He has attended rehab therapy since our last visit and those records are reviewed.  He is still participating in a sublingual apomorphine research trial through [facility] and was last there on June 28, 2018.  PREVIOUS  MEDICATIONS: Requip and Azilect (both with GI side effects and dizziness)  ALLERGIES:  No Known Allergies  CURRENT MEDICATIONS:  Outpatient Encounter Medications as of 08/17/2018  Medication Sig  . albuterol (PROVENTIL) (2.5 MG/3ML) 0.083% nebulizer solution Take 2.5 mg by nebulization every 6 (six) hours as needed for wheezing or shortness of breath.  . carbidopa-levodopa-entacapone (STALEVO) 50-200-200 MG tablet Take 1 tablet by mouth 5 (five) times daily. Every 3 hours  . clonazePAM (KLONOPIN) 1 MG tablet Take 1 tablet (1 mg total) by mouth at bedtime.  . diclofenac sodium (VOLTAREN) 1 % GEL Apply topically 4 (four) times daily.  . fluticasone (FLONASE) 50 MCG/ACT nasal spray Use as directed  . losartan (COZAAR) 50 MG tablet Take 50 mg by mouth daily.  . meloxicam (MOBIC) 15 MG tablet Take 15 mg by mouth daily.  Marland Kitchen. neomycin-polymyxin b-dexamethasone (MAXITROL) 3.5-10000-0.1 OINT 1 application.  . propranolol (INDERAL) 40 MG tablet TAKE 2 TABLETS TWICE A DAY  . pyridostigmine (MESTINON) 60 MG tablet Take 1 tablet (60 mg total) by mouth 3 (three) times daily.  . rotigotine (NEUPRO) 2 MG/24HR Place 1 patch onto the skin daily.  . rotigotine (NEUPRO) 4 MG/24HR Place 1 patch onto the skin daily.  . rotigotine (NEUPRO) 4 MG/24HR Place 1 patch onto the skin daily.  . sildenafil (REVATIO) 20 MG tablet Take 1 tablet (20 mg total) by mouth 3 (three) times daily. (Patient not taking: Reported on 06/16/2018)  . tadalafil (ADCIRCA/CIALIS) 20 MG tablet Take 20 mg by mouth daily as needed for erectile dysfunction.   No facility-administered encounter medications on file as of 08/17/2018.     PAST MEDICAL HISTORY:   Past Medical History:  Diagnosis Date  . Bell's palsy   . Constipation   . Erectile dysfunction   . History of stomach ulcers   . Hypertension   . Insomnia   . Knee pain   . Murmur   . Parkinson disease ([facility])   . Parkinsons ([facility])   . Weakness generalized     PAST SURGICAL  HISTORY:   Past Surgical History:  Procedure Laterality Date  . NO PAST SURGERIES      SOCIAL HISTORY:   Social History   Socioeconomic History  . Marital status: Married    Spouse name: Not on file  . Number of children: 1  . Years of education: College  . Highest education level: Not on file  Occupational History  . Occupation: Emergency planning/management officerroject Manager  Social Needs  . Financial resource strain: Not on file  . Food insecurity:    Worry: Not on file    Inability: Not on file  . Transportation needs:    Medical: Not on file  Non-medical: Not on file  Tobacco Use  . Smoking status: Never Smoker  . Smokeless tobacco: Never Used  Substance and Sexual Activity  . Alcohol use: Never    Frequency: Never  . Drug use: Never  . Sexual activity: Not on file  Lifestyle  . Physical activity:    Days per week: Not on file    Minutes per session: Not on file  . Stress: Not on file  Relationships  . Social connections:    Talks on phone: Not on file    Gets together: Not on file    Attends religious service: Not on file    Active member of club or organization: Not on file    Attends meetings of clubs or organizations: Not on file    Relationship status: Not on file  . Intimate partner violence:    Fear of current or ex partner: Not on file    Emotionally abused: Not on file    Physically abused: Not on file    Forced sexual activity: Not on file  Other Topics Concern  . Not on file  Social History Narrative   ** Merged History Encounter **       Lives at home with wife and son. Right-hand. Occasional use of caffeine.    FAMILY HISTORY:   Family Status  Relation Name Status  . Mother  Deceased  . Father  Alive  . Sister 2 Alive  . Brother 2 Deceased  . Son  Alive  . Daughter  Alive    ROS:  ROS  PHYSICAL EXAMINATION:    VITALS:   There were no vitals filed for this visit.  GEN:  The patient appears stated age and is in NAD. HEENT:  Normocephalic, atraumatic.   The mucous membranes are moist. The superficial temporal arteries are without ropiness or tenderness. CV:  RRR Lungs:  CTAB Neck/HEME:  There are no carotid bruits bilaterally.  Neurological examination:  Orientation: The patient is alert and oriented x3. Fund of knowledge is appropriate.  Recent and remote memory are intact.  Attention and concentration are normal.    Able to name objects and repeat phrases. Cranial nerves: There is good facial symmetry. There is facial hypomimia.  Pupils are equal round and reactive to light bilaterally. Fundoscopic exam reveals clear margins bilaterally. Extraocular muscles are intact. The visual fields are full to confrontational testing. The speech is fluent and clear. he is hypophonic.  Soft palate rises symmetrically and there is no tongue deviation. Hearing is intact to conversational tone. Sensation: Sensation is intact to light and pinprick throughout (facial, trunk, extremities). Vibration is intact at the bilateral big toe. There is no extinction with double simultaneous stimulation. There is no sensory dermatomal level identified. Motor: Strength is 5/5 in the bilateral upper and lower extremities.   Shoulder shrug is equal and symmetric.  There is no pronator drift. Deep tendon reflexes: Deep tendon reflexes are 2/4 at the bilateral biceps, triceps, brachioradialis, 2+-3/4 at the bilateral patella and achilles. Plantar responses are downgoing bilaterally.  Movement examination: Tone: There is mild increased tone in the bilateral upper extremities.  The tone in the lower extremities is normal.  Abnormal movements: none Coordination:  There is decremation with RAM's, with any form of RAMS, including alternating supination and pronation of the forearm, hand opening and closing, finger taps, heel taps and toe taps bilateral. Gait and Station: The patient has no difficulty arising out of a deep-seated chair without the use of  the hands. The patient's stride  length is just slightly decreased but he does not shuffle.  The patient has a negative pull test.      ASSESSMENT/PLAN:  1.  Idiopathic Parkinson's disease, diagnosed in 2016 with symptoms to 2015  -At initial diagnosis, patient was started on a fairly large dose of levodopa (carbidopa/levodopa 25/100, 2 tablets 3 times per day).  He tried Requip for a very short period of time and this caused dizziness.  He is currently on Stalevo 200, 1 tablet 5 times per day (1000 mg total per day of levodopa)  -I would recommend starting a different dopamine agonist, perhaps rotigotine given the slow release over 24 hours.  Patient was agreeable.  We will start rotigotine and work to 4 mg daily.  Discussed risk, benefits, and side effects.  -He and I talked about the importance of continuity of care.  He has consistently been seeing Ruben Silva, but also has been seeing Dr. Westley Silva.  He was just referred to [facility] and has appointments with Dr. Westley Silva next week and [name] in December.  He and I talked about the fact that he really only needs one movement specialist and certainly not 3.  Talked about fact that everyone he sees is good, but probably only needs 1 movement specialist.  -he is in amorphine study for sublingual Apomorphine.  He uses this every few days.  He does find it beneficial.  He will continue to follow with [facility] for that.   2.  he is on mestinon presumably for [facility]  -reviewed records and he was started on it for gastroparesis.  However, now he has had to go on antihypertensives.  I am going to slowly d/c this.  He also has a lot of GI upset which could be from this medication.  Pt to monitor BP.  3.  Depression  -wife and pt just separated and mother died in 2023-05-01.  Talked about counseling.  Resources were given.  -was going to start remeron, 15 mg nightly, but started other meds today.  Will consider.  Goal is to decrease klonopin.  He is on a high dosage and pt doesn't wish to be on this high dose  either.  -He has had suicidal thoughts in the past.  He had a Engineer, manufacturing.  Will call 911/go to hospital should he have a plan.  4.  ***   Cc:  Jordan Hawks, PA-C

## 2018-08-17 ENCOUNTER — Ambulatory Visit: Payer: 59 | Admitting: Neurology

## 2018-08-25 ENCOUNTER — Ambulatory Visit: Payer: 59 | Admitting: Psychology

## 2018-09-01 ENCOUNTER — Telehealth: Payer: Self-pay | Admitting: Neurology

## 2018-09-01 NOTE — Telephone Encounter (Signed)
Spoke with patient. He states he has low energy, cough, pain in his chest, but these symptoms come and go. He states this has been going on for two weeks. He states the only thing that has changed has been the addition of the Neupro patch. He is concerned again about the side effects. I assured him these were not side effects of this medication and he has not reported any side effects from Neupro. It does show where he was seen at [facility] two weeks ago for upper respiratory infection and was advised to get a chest xray. He states he never did that. I encouraged him to do as instructed by his other providers and follow up with PCP re: these symptoms.  Dr. Arbutus Leas [place] Childes.

## 2018-09-01 NOTE — Telephone Encounter (Signed)
Patient is calling in stating that his body is feeling weird please call him back at [phone]. Thanks!

## 2018-09-01 NOTE — Telephone Encounter (Signed)
Agree.  If seen and PCP advised CXR, then needs to f/u.  And he has been on neupro a long time.  He missed his f/u appt here (believe he was 30 min late).  I did hear you tell him on phone to make sure that he shows up to next visit

## 2018-09-02 ENCOUNTER — Ambulatory Visit: Payer: 59 | Admitting: Psychology

## 2018-09-02 DIAGNOSIS — F4321 Adjustment disorder with depressed mood: Secondary | ICD-10-CM | POA: Diagnosis not present

## 2018-09-16 ENCOUNTER — Ambulatory Visit: Payer: 59 | Admitting: Psychology

## 2018-09-23 ENCOUNTER — Other Ambulatory Visit: Payer: Self-pay | Admitting: Neurology

## 2018-09-26 ENCOUNTER — Encounter: Payer: Self-pay | Admitting: Neurology

## 2018-09-26 ENCOUNTER — Telehealth: Payer: Self-pay | Admitting: Neurology

## 2018-09-26 ENCOUNTER — Ambulatory Visit: Payer: 59 | Admitting: Neurology

## 2018-09-26 VITALS — BP 135/96 | HR 66 | Ht 68.75 in | Wt 201.0 lb

## 2018-09-26 DIAGNOSIS — G20 Parkinson's disease: Secondary | ICD-10-CM | POA: Diagnosis not present

## 2018-09-26 NOTE — Telephone Encounter (Signed)
Patient aware he is still on cancellation list.   He states he needs a new card for Neupro, his no longer works for the lower copay. New card at the front for pick up.

## 2018-09-26 NOTE — Telephone Encounter (Signed)
Patient called needing to se about moving his appointment up due to not improving. He is on a wait list. He also called regarding a Neupro Patch that he was given that has ran out. He is asking for another. Please Call. Thanks

## 2018-09-26 NOTE — Progress Notes (Signed)
Chief Complaint  Patient presents with  . Parkinson's Disease    He is currently taking Stalevo 50/200/247m five times per day (every 3 hours).         PATIENT: Ruben GarfieldDOB: 804/05/1971 Chief Complaint  Patient presents with  . Parkinson's Disease    He is currently taking Stalevo 50/200/2028mfive times per day (every 3 hours).      HISTORICAL  Ruben [age]itten89o right-handed male, with early-onset idiopathic Parkinson's disease, his primary care physician Dr. GeIona BeardI saw him initially in June 2016  Since October 2015,he was noticed by the family to move at the slower pace, shuffling his gait,gradually getting worse over the past few months, he was also noticed to have right hand shaking, slow to response, decreased facial expression, to the point of needing help to pull up his pants sometimes, he also complains of orthostatic dizziness, difficulty sleeping at nighttime, fatigue, chronic constipation, impotence. 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, complains of dizziness, especially with sudden positional change  MRI brain in June 2016 was normal, MRI of cervical spine, mild degenerative disc disease, no significant canal or foraminal stenosis CAT scan of the chest was normal  Laboratory in 2016 showed normal or Negative TSH, ESR, C-reactive protein RPR HIV, B12,CMP CBC, protein electrophoresis, more extensive laboratory evaluations, negative or normal, paraneoplastic panel, ceruloplasmin, copper level, vitamin B1, B12, vitamin D, [place] [address]RPR, HIV, folic acid, C-reactive protein, CPK, ESR, protein electrophoresis, Lyme titer, CBC, CMP  [facility], concurred with the diagnosis of early-onset idiopathic Parkinson's disease  He started sinemet since June 2016, gradually  titrating dose, which did help his symptoms, he notice wearing off despite quick titrating dose of Sinemet 25/100 mg, 2 tablets 4 times a day, wearing off dyskinesia, he was started on Stalevo 200 mg since February 27 2016 at 6am, 10am, 4pm, 7pm, He goes to bed at 10pm.  But could not tolerate dopamine agonist or Azilect, Azilect-upset stomach, worsening dizziness, Requip-significant GI side effect, dizziness,  He walks regularly 2-4 miles each day.  he was taken to the emergency room in October 2016 after fall, dizziness, repeat CAT scan of the brain in October 2016 was normal  He now complains of constant left shoulder pain  UPDATE May 12 2016: Parkinson's disease: he was started on Stalevo 200 mg since February 27 2016 at 6am, 10am, 4pm, 7pm, He goes to bed at 10pm. He is very happy about current medications, denies significant bradykinesia, able to keep up with his working schedule, complains of orange color discoloration of the urine  Chronic Constipation: Once or twice each week, he took Dulcolax on the weekends,  Insomnia:  He takes clonazepam 1 mg every night works well   Orthostatic dizziness: Worsening with [place] agonist, and Azilect, improved after he stopped medications,  Update June 26 2016: He came here as outlined scheduled urgent visit, woke up in the morning noticed right lateral leg pain, increased gait difficulty because of the pain, continue have chronic constipation despite Linzess 1459maily treatment  UPDATE Aug 12 2016: He noticed wearing off of his levodopa before the next dose, he is taking Stalevo 50/200/200 at 6 AM,11am, 4pm, 8pm, "hit a brick wall around 11am every morning".   UPDATE November 10 2016: He complains of worsening slow movement, is now taking Stalevo 50-200-200 5 times a day, he denies significant side effect from medications,  He complains worsening constipation despite Linzess 290 mcg daily,  He also complains of significant headaches, has been  taking Excedrin Migraine once a day for 2 weeks,  he also complains of orthostatic dizziness, lightheaded when getting up quickly, there was one passing out episodes when he first had up from prolonged sitting.  He sleeps well with clonazepam 2 mg every night.   UPDATE February 11 2017: Difficulty sleeping: He has difficulty sleeping, sometimes 3-4 night in a roll, seroqueal 25 mg the prescription was given in July 2017, if he take 25 mg he can sleep better, but has next morning hang over, he is also taking   clonazepam 1 mg 2 tablets at that time  Constipation, even with Linzess 270m daily  I am able to review CT abdomen scan on December 25 2016 marked gastric distention with no underlying cause identified, this could be due to gastroparesis,  I was able to review [facility] there was food filled distended stomach and proximal duodenum, EGD is pending on March 02 2017.  His tremor is more prominent, mild peak dose dyskinesia  He is now taking Stalevo 200 at 6, 10, 15, 18, 20, most difficult time is late morning,  UPDATE Jul 06 2017: He is in sublingual apomorphine clinical trial, which has helped his symptoms greatly,  He is now taking Stalevo 200 5 times a day,   He still has chronic constipations, taking  Linzess  290 mg daily. Mestinon 674mtid, Latulose as needed.  Chronic insomnia, he is taking clonazepam 91m54m tabs qhs.  UPDATE Sep 16 2017: He took Stalevo at 6, 40,44400, 1730, 2100,  He took night dose, so he can move function in the morning time.  He complains of right leg pain, no low back pain, around right knee, was seen by orthopedic in the past, no significant abnormality found.   He has chronic cough, sore throat, no fever, feel tired.  He had mold in his house, is going to have his house inspected.   UPDATE December 27 2017: He has mild blurry vision, he has chronic low back pain, going done to his right leg, lateral thigh,  to his right foot, he was seen by chiropractor,  He is taking stalevo 5 times a day.  [facility] Sid[facility]aha Sprangkes sublingual Apomorphine, but only take it few times every few month  UPDATE March 31 2017: He continue have slow worsening of his Parkinson symptoms over the past few months, despite taking Stalevo 205 times a day, and also clonazepam 2 mg at nighttime for insomnia, today's examination is taking about 40 minutes after his last dose of levodopa, he was noted to have significant rigidity, bradykinesia  UPDATE Jun 02 2018: He is overall stable with current dose of Stalevo 50-200-200 five times a day, he has continue with his physical regularly, he wants to have [facility] JefYetta Flocke still has intermittent constipation, insomnia, taking clonazepam 1 mg 2 tablets every night, sometimes mixed with Tylenol PM  UPDATE Sep 26 2018:  He was seen by Dr. TatCarles Collet October 2019, just 2 weeks aft previous visit with me, he was started on  Neupro patch 4 mg every day,  concerning about side effect, but still using it, reported some improvement, he is also taking Stalevo 200 mg 5 times a day,at 6, 10, 14, 17, 21,   He reported a lot of stress, separate from his wife, lives with his son, was recently treated for worsening constipation by GI specialist at [facility],  He was seen by different specialist recently, orthopedic for knee pain, eye doctor for left stye   REVIEW OF SYSTEMS: Full 14 system review of systems performed and notable only for as above. All rest review of the system was  ALLERGIES: No Known Allergies  HOME MEDICATIONS: Current Outpatient Prescriptions  Medication Sig Dispense Refill  . docusate sodium (COLACE) 100 MG capsule   3  . ibuprofen (ADVIL,MOTRIN) 200 MG tablet Take 200 mg by mouth every 6 (six) hours as needed.    . polyethylene glycol powder (GLYCOLAX/MIRALAX) powder       . valsartan-hydrochlorothiazide (DIOVAN-HCT) 160-12.5 MG per tablet        PAST MEDICAL HISTORY: Past Medical History  Diagnosis Date  . Murmur   . Hypertension   . Bell's palsy   . Constipation   . Weakness generalized     PAST SURGICAL HISTORY: Past Surgical History:  Procedure Laterality Date  . NO PAST SURGERIES      FAMILY HISTORY: Family History  Problem Relation Age of Onset  . Hypertension Mother 47  . Liver disease Mother   . Hypercholesterolemia Mother   . Heart attack Mother   . Stroke Father   . Hypertension Father   . Diabetes Father   . Dementia Father   . Diabetes Sister     SOCIAL HISTORY:  History   Social History  . Marital Status: Married    Spouse Name: N/A  . Number of Children: 1  . Years of Education: College   Occupational History  . Government social research officer    Social History Main Topics  . Smoking status: Never Smoker   . Smokeless tobacco: Not on file  . Alcohol Use: No  . Drug Use: No  . Sexual Activity: Not on file   Other Topics Concern  . Not on file   Social History Narrative   Lives at home with wife and son.   Right-hand.   Occasional use of caffeine.     PHYSICAL EXAM   Vitals:   09/26/18 1551  Height: 5' 8.75" (1.746 m)    Not recorded      Body mass index is 30.2 kg/m.  PHYSICAL EXAMNIATION: This exam is performed 2 hours after last dose of Stalevo 200  At 1400 Gen: NAD, conversant, well nourised, obese, well groomed                     Cardiovascular: Regular rate rhythm, no peripheral edema, warm, nontender. Eyes: Conjunctivae clear without exudates or hemorrhage Neck: Supple, no carotid bruise. Pulmonary: Clear to auscultation bilaterally   NEUROLOGICAL EXAM:  MENTAL STATUS: tired looking middle aged male, mild masked face, Speech:    Speech is normal; fluent and spontaneous with normal comprehension.  Cognition:    The patient is oriented to person, place, and time;     recent and remote memory  intact;     language fluent;     normal attention, concentration,     fund of knowledge.  CRANIAL NERVES: CN II: Visual fields are full to confrontation.   Pupil equal round reactive to light CN III, IV, VI: extraocular movement are  normal. No ptosis. CN V: Facial sensation is intact to pinprick in all 3 divisions bilaterally. Corneal responses are intact.  CN VII: Face is symmetric with normal eye closure and smile. CN VIII: Hearing is normal to rubbing fingers CN IX, X: Palate elevates symmetrically. Phonation is normal. CN XI: Head turning and shoulder shrug are intact CN XII: Tongue is midline with normal movements and no atrophy.  MOTOR:  He has mild to moderate bilateral upper and lower extremity, nuchal rigidity, moderate right worse than the left limb bradykinesia,    REFLEXES: Reflexes are 2+ and symmetric at the biceps, triceps, knees, and ankles. Plantar responses are flexor.  SENSORY: Light touch, pinprick, position sense, and vibration sense are intact in fingers and toes.  COORDINATION: Rapid alternating movements and fine finger movements are intact. There is no dysmetria on finger-to-nose and heel-knee-Silva.   GAIT/STANCE: He has mildly decreased bilateral arm swing, decreased stride, mild enblock turning. Difficulty clear his feet from floor   DIAGNOSTIC DATA (LABS, IMAGING, TESTING) - I reviewed patient records, labs, notes, testing and imaging myself where available.  ASSESSMENT AND PLAN  Ruben Silva is a 48 y.o. male    Early onset idiopathic Parkinson's disease  Keep Stalevo to 50/200/200, 5 times a day every 3 -4 hours.  Could not tolerate azilect, dopamine agonist Requip, due to GI side effect, increased orthostatic dizziness   Now on Neupro patch, overall tolerating it well  He decided not to go to [facility] movement specialist Dr. Yetta Flock.  After extensive discussion with patient, he was seen by different specialist, including two  movement specialist Dr. Deboraha Sprang and Dr. Carles Collet, he decided to keep follow-up with Dr. Carles Collet and Dr. Deboraha Sprang.   Orthostatic dizziness  Encouraging moderate exercise, increase water intake  Chronic Constipation:  Continue moderate exercise, increase water intake   Keep GI follow up  Chronic insomnia  Keep clonazepam 37mlligrams 1 tab every night      YMarcial Pacas M.D. Ph.D.  [facility] [address] [place] [id number]: [phone]: [phone]

## 2018-09-30 ENCOUNTER — Ambulatory Visit: Payer: 59 | Admitting: Psychology

## 2018-10-03 ENCOUNTER — Ambulatory Visit: Payer: 59 | Admitting: Psychology

## 2018-10-03 DIAGNOSIS — F4321 Adjustment disorder with depressed mood: Secondary | ICD-10-CM

## 2018-10-06 ENCOUNTER — Encounter: Payer: Self-pay | Admitting: Neurology

## 2018-10-06 ENCOUNTER — Ambulatory Visit: Payer: 59 | Admitting: Neurology

## 2018-10-06 VITALS — BP 104/70 | HR 60 | Ht 68.75 in | Wt 204.0 lb

## 2018-10-06 DIAGNOSIS — F330 Major depressive disorder, recurrent, mild: Secondary | ICD-10-CM | POA: Diagnosis not present

## 2018-10-06 DIAGNOSIS — K5901 Slow transit constipation: Secondary | ICD-10-CM | POA: Diagnosis not present

## 2018-10-06 DIAGNOSIS — G20 Parkinson's disease: Secondary | ICD-10-CM | POA: Diagnosis not present

## 2018-10-06 NOTE — Patient Instructions (Addendum)
Constipation and Parkinson's disease:  1.Rancho recipe for constipation in Parkinsons Disease:  -1 cup of unprocessed bran (need to get this at [employer], [employer] or similar type of store), 2 cups of applesauce in 1 cup of prune juice 2.  Increase fiber intake (Metamucil,vegetables) 3.  Regular, moderate exercise can be beneficial. 4.  Avoid medications causing constipation, such as medications like antacids with calcium or magnesium 5.  Laxative overuse should be avoided. 6.  Stool softeners (Colace) can help with chronic constipation. 7.  Increase water intake.  You should be drinking 1/2 gallon of water a day as long as you have not been diagnosed with congestive heart failure or renal/kidney failure.  This is probably the single greatest thing that you can do to help your constipation.  Get back with the THRIVE group at [facility] and get back to exercise.

## 2018-10-06 NOTE — Progress Notes (Signed)
Ruben Silva was seen today in the movement disorders clinic for neurologic consultation at the request of Ruben Silva.  The consultation is for the evaluation of Parkinson's disease.  Prior neurology records are reviewed.  Patient has been under the care of Ruben Silva.  Patient symptoms started in October, 2015 with gait change.  He presented to Ruben Silva in June, 2016 with slow, shuffling gait and tremors.  His initial starting dose of levodopa was "up to 2 tablets 3 times per day."  Levodopa did help.  He was referred to Ruben Silva in September, 2016.  He has been following both at [facility] as well as [facility].  At that time, Ruben Silva felt a levodopa sparing strategy was best, but did not change his levodopa.  He did recommend Neupro, but did not start it.  Patient was already on clonazepam and this was increased by Ruben Silva to 1 mg nightly.  He did try ropinirole in early 2017 but was not able to tolerate it because of dizziness and GI side effects.  At some point this is reintroduced (mid 2017) but seems to cause GI upset again.  Azilect was initiated but ultimately discontinued because of GI issues.  In June, 2017 he was changed from carbidopa/levodopa 25/100 to Stalevo.  He is currently on Stalevo 200, 1 tablet 5 times per day.  He is also on clonazepam, 2 mg at night.  He is on Mestinon, 60 mg 3 times per day.  He was last seen by Ruben Silva on June 02, 2018 at which point he was referred to Ruben Silva.  He does have an appointment with [name], but this was with Ruben Silva on August 15, 2018  He last saw Ruben Silva in September, 2018.  However, he has been participating in a sublingual apomorphine study.  He has an appointment with Ruben Silva next month.  10/06/18 update:  Patient is seen today in follow-up for Parkinson's disease.  He missed his last appt with me in December.   I discontinued his Mestinon last visit, due to GI upset and the fact that he was placed on  antihypertensives.  He reports that GI upset has been okay except for constipation.  I started the patient on the Neupro patch and states that he is doing "pretty well" but takes it off at bedtime as he thinks that it affects sleep.  "I am fine through the night."  No compulsive behaviors.  No sleep attacks. Notes some L foot cramping when time for medication.  Some speech slurring but doesn't want to go to ST yet.   He is also on Stalevo 200, 1 tablet 5 times per day.  No falls.  No lightheadedness or near syncope.  He did call here since last visit, needing a refill on his clonazepam which he was taking for insomnia.  He was on 2 mg daily.  We discussed that this was a higher dose than I felt comfortable with.  I did give him a refill, but only of 1 mg daily.  Last visit, the patient talked a lot about depression and we give him counseling resources.  He reports today that he is going and he thinks that it is valuable.  He has attended rehab therapy since our last visit and those records are reviewed.  He is still participating in a sublingual apomorphine research trial through [facility] and was last there on June 28, 2018.  [facility]  He was seen by Dr. Terrace ArabiaYan on September 26, 2018.  C/o L chest pain.  States that saw PCP and told work up negative.  States that it "could be anxiety."  Has a stye for last few months that he plans to have opened/I&D by Ruben Silva next week.  Having knee pain.  Had steroid injection.  Awaiting gel injections but awaiting.  Not been able to exercise as much because of this and cost.  He cannot do the PD cycle because of working still.  He feels a big difference when he cannot exercise.    PREVIOUS MEDICATIONS: Requip and Azilect (both with GI side effects and dizziness)  ALLERGIES:  No Known Allergies  CURRENT MEDICATIONS:  Outpatient Encounter Medications as of 10/06/2018  Medication Sig  . carbidopa-levodopa-entacapone (STALEVO) 50-200-200 MG  tablet TAKE 1 TABLET BY MOUTH 5 (FIVE) TIMES DAILY. EVERY 3 HOURS  . clonazePAM (KLONOPIN) 1 MG tablet Take 1 tablet (1 mg total) by mouth at bedtime.  Marland Kitchen. losartan (COZAAR) 50 MG tablet Take 50 mg by mouth daily.  . meloxicam (MOBIC) 15 MG tablet Take 15 mg by mouth daily.  Marland Kitchen. neomycin-polymyxin b-dexamethasone (MAXITROL) 3.5-10000-0.1 OINT 1 application.  Marland Kitchen. NEUPRO 4 MG/24HR APPLY 1 PATCH ONTO THE SKIN EVERY DAY  . propranolol (INDERAL) 40 MG tablet TAKE 2 TABLETS TWICE A DAY  . sildenafil (REVATIO) 20 MG tablet Take 1 tablet (20 mg total) by mouth 3 (three) times daily.  . tadalafil (ADCIRCA/CIALIS) 20 MG tablet Take 20 mg by mouth daily as needed for erectile dysfunction.   No facility-administered encounter medications on file as of 10/06/2018.     PAST MEDICAL HISTORY:   Past Medical History:  Diagnosis Date  . Bell's palsy   . Constipation   . Erectile dysfunction   . History of stomach ulcers   . Hypertension   . Insomnia   . Knee pain   . Murmur   . Parkinson disease (HCC)   . Parkinsons (HCC)   . Weakness generalized     PAST SURGICAL HISTORY:   Past Surgical History:  Procedure Laterality Date  . NO PAST SURGERIES      SOCIAL HISTORY:   Social History   Socioeconomic History  . Marital status: Married    Spouse name: Not on file  . Number of children: 1  . Years of education: College  . Highest education level: Not on file  Occupational History  . Occupation: Emergency planning/management officerroject Manager  Social Needs  . Financial resource strain: Not on file  . Food insecurity:    Worry: Not on file    Inability: Not on file  . Transportation needs:    Medical: Not on file    Non-medical: Not on file  Tobacco Use  . Smoking status: Never Smoker  . Smokeless tobacco: Never Used  Substance and Sexual Activity  . Alcohol use: Never    Frequency: Never  . Drug use: Never  . Sexual activity: Not on file  Lifestyle  . Physical activity:    Days per week: Not on file    Minutes per  session: Not on file  . Stress: Not on file  Relationships  . Social connections:    Talks on phone: Not on file    Gets together: Not on file    Attends religious service: Not on file    Active member of club or organization: Not on file    Attends meetings of clubs or  organizations: Not on file    Relationship status: Not on file  . Intimate partner violence:    Fear of current or ex partner: Not on file    Emotionally abused: Not on file    Physically abused: Not on file    Forced sexual activity: Not on file  Other Topics Concern  . Not on file  Social History Narrative   ** Merged History Encounter **       Lives at home with wife and son. Right-hand. Occasional use of caffeine.    FAMILY HISTORY:   Family Status  Relation Name Status  . Mother  Deceased  . Father  Alive  . Sister 2 Alive  . Brother 2 Deceased  . Son  Alive  . Daughter  Alive    ROS:  Review of Systems  Constitutional: Negative.   HENT: Negative.   Eyes: Negative.   Respiratory: Negative.   Cardiovascular: Positive for chest pain.  Gastrointestinal: Positive for constipation.  Genitourinary: Negative.   Skin: Negative.   Psychiatric/Behavioral: The patient is nervous/anxious.       PHYSICAL EXAMINATION:    VITALS:   Vitals:   10/06/18 1319  BP: 104/70  Pulse: 60  SpO2: 96%  Weight: 204 lb (92.5 kg)  Height: 5' 8.75" (1.746 m)    GEN:  The patient appears stated age and is in NAD. HEENT:  Normocephalic, atraumatic.  The mucous membranes are moist. The superficial temporal arteries are without ropiness or tenderness. CV:  RRR Lungs:  CTAB Neck/HEME:  There are no carotid bruits bilaterally.  Neurological examination:  Orientation: The patient is alert and oriented x3. Cranial nerves: There is good facial symmetry. The speech is fluent and clear. Soft palate rises symmetrically and there is no tongue deviation. Hearing is intact to conversational tone. Sensation: Sensation is  intact to light touch throughout Motor: Strength is 5/5 in the bilateral upper and lower extremities.   Shoulder shrug is equal and symmetric.  There is no pronator drift.   Movement examination: Tone: There is normal tone in the upper and lower extremities. Abnormal movements: none Coordination:  There is no decremation, with any form of RAMS, including alternating supination and pronation of the forearm, hand opening and closing, finger taps, heel taps and toe taps bilaterally Gait and Station: The patient has no difficulty arising out of a deep-seated chair without the use of the hands. The patient's stride length is good today.  He actually is able to jog down the hall.  ASSESSMENT/PLAN:  1.  Idiopathic Parkinson's disease, diagnosed in 2016 with symptoms to 2015  -He will continue Stalevo 200, 1 tablet 5 times per day   -Doing well with rotigotine, 4 mg daily.  He is removing the patch at night, which I told him not to do, but he insists that it causes him to be awake at night and insists it works this way.  -he is in amorphine study for sublingual Apomorphine.  He uses this every few days.  He does find it beneficial.  He will continue to follow with [facility] for that.  -Needs to get back into exercise and discussed this.  He used to be in the thrive program and I encouraged him to get back into that as that works around his work schedule.  2.  Depression  -wife and pt just separated and mother died in 05/01/23.  Talked about counseling.  Resources were given.  -Doing some better that he is in counseling.  Congratulated him on doing that.  3.  constipation  -rancho recipe given  -increase hydration  -add colace  -add miralax  4.  Follow up is anticipated in the next few months, sooner should new neurologic issues arise.  Much greater than 50% of this visit was spent in counseling and coordinating care.  Total face to face time:  25 min   Cc:  Jordan Hawks, PA-C

## 2018-10-17 ENCOUNTER — Ambulatory Visit: Payer: 59 | Admitting: Psychology

## 2018-10-18 ENCOUNTER — Other Ambulatory Visit: Payer: Self-pay | Admitting: Neurology

## 2018-10-24 ENCOUNTER — Other Ambulatory Visit: Payer: Self-pay | Admitting: *Deleted

## 2018-10-24 MED ORDER — CLONAZEPAM 1 MG PO TABS: 1.0000 mg | ORAL_TABLET | Freq: Every day | ORAL | 5 refills | Status: DC

## 2018-10-24 NOTE — Progress Notes (Signed)
Refilled clonazepam.

## 2018-10-26 ENCOUNTER — Emergency Department (HOSPITAL_COMMUNITY)
Admission: EM | Admit: 2018-10-26 | Discharge: 2018-10-26 | Disposition: A | Discharge status: Home / Self Care | Payer: 59 | Attending: Emergency Medicine | Admitting: Emergency Medicine

## 2018-10-26 ENCOUNTER — Encounter (HOSPITAL_COMMUNITY): Payer: Self-pay | Admitting: *Deleted

## 2018-10-26 DIAGNOSIS — R1310 Dysphagia, unspecified: Secondary | ICD-10-CM

## 2018-10-26 DIAGNOSIS — R259 Unspecified abnormal involuntary movements: Secondary | ICD-10-CM

## 2018-10-26 DIAGNOSIS — I10 Essential (primary) hypertension: Secondary | ICD-10-CM | POA: Insufficient documentation

## 2018-10-26 DIAGNOSIS — G20 Parkinson's disease: Secondary | ICD-10-CM | POA: Diagnosis not present

## 2018-10-26 DIAGNOSIS — R220 Localized swelling, mass and lump, head: Secondary | ICD-10-CM | POA: Diagnosis present

## 2018-10-26 DIAGNOSIS — R1314 Dysphagia, pharyngoesophageal phase: Secondary | ICD-10-CM | POA: Diagnosis not present

## 2018-10-26 DIAGNOSIS — Z79899 Other long term (current) drug therapy: Secondary | ICD-10-CM | POA: Diagnosis not present

## 2018-10-26 DIAGNOSIS — R1319 Other dysphagia: Secondary | ICD-10-CM

## 2018-10-26 NOTE — ED Provider Notes (Signed)
[facility]-EMERGENCY DEPT Provider Note   CSN: [id number] Arrival date & time: 10/26/18  1803    History   Chief Complaint Chief Complaint  Patient presents with  . Facial Swelling  . Hypertension  . Headache    HPI Ruben Silva is a 48 y.o. male.     HPI  48 year old male comes in a chief complaint of facial swelling, difficulty swallowing.  He has history of Bell's palsy, Parkinson's disease.  Patient states that over the past 4-6 months he has had intermittent episodes of difficulty in swallowing.  Patient had a similar event around 4:30 PM today.  He describes these events as feeling like his tongue is hanging down lower into his throat and him having difficulty swallowing.  He also feels like he is choking, gagging.  The symptoms typically resolve on their own from 30 minutes to 1 hour, however today symptoms lasted longer.  Patient has been in the triage for 3 hours prior to my evaluation, and states that he started feeling better while in the waiting room.  He denies any new medications and patient denies any new exposures.  There is also no wheezing.  He thinks that when he gets his symptoms his face started swelling.  ROS is also positive for hoarseness in voice  Past Medical History:  Diagnosis Date  . Bell's palsy   . Constipation   . Erectile dysfunction   . History of stomach ulcers   . Hypertension   . Insomnia   . Knee pain   . Murmur   . Parkinson disease (HCC)   . Parkinsons (HCC)   . Weakness generalized     Patient Active Problem List   Diagnosis Date Noted  . Right lumbar radiculopathy 12/27/2017  . Gastroparesis 07/06/2017  . Orthostatic dizziness 05/12/2016  . Low back pain 05/12/2016  . Chronic constipation 12/17/2015  . Chronic insomnia 12/17/2015  . Left hip pain 12/16/2015  . Parkinson's disease (HCC) 09/11/2015  . HYPERTENSION 11/21/2008  . MURMUR 11/21/2008  . CHEST PAIN, ATYPICAL 11/21/2008    Past  Surgical History:  Procedure Laterality Date  . NO PAST SURGERIES          Home Medications    Prior to Admission medications   Medication Sig Start Date End Date Taking? Authorizing Provider  AMITIZA 24 MCG capsule Take 24 mcg by mouth daily as needed for constipation. 10/14/18  Yes [provider]  carbidopa-levodopa-entacapone (STALEVO) 50-200-200 MG tablet TAKE 1 TABLET BY MOUTH 5 (FIVE) TIMES DAILY. EVERY 3 HOURS 09/23/18  Yes Levert Feinstein, MD  clonazePAM (KLONOPIN) 1 MG tablet Take 1 tablet (1 mg total) by mouth at bedtime. 10/24/18  Yes Levert Feinstein, MD  doxycycline (VIBRA-TABS) 100 MG tablet Take 100 mg by mouth 2 (two) times daily. 10/14/18  Yes [provider]  losartan (COZAAR) 100 MG tablet Take 100 mg by mouth daily. 09/30/18  Yes [provider]  neomycin-polymyxin b-dexamethasone (MAXITROL) 3.5-10000-0.1 OINT 1 application.   Yes [provider]  NEUPRO 4 MG/24HR APPLY 1 PATCH ONTO THE SKIN EVERY DAY Patient taking differently: Place 1 patch onto the skin daily.  09/23/18  Yes Tat, Octaviano Batty, DO  propranolol (INDERAL) 40 MG tablet TAKE 2 TABLETS TWICE A DAY 10/18/18  Yes Levert Feinstein, MD  sildenafil (REVATIO) 20 MG tablet Take 1 tablet (20 mg total) by mouth 3 (three) times daily. Patient not taking: Reported on 10/26/2018 01/10/18   Asa Lente, MD  Family History Family History  Problem Relation Age of Onset  . Hypertension Mother 61  . Liver disease Mother   . Hypercholesterolemia Mother   . Heart attack Mother   . Stroke Father   . Hypertension Father   . Diabetes Father   . Dementia Father   . Diabetes Sister     Social History Social History   Tobacco Use  . Smoking status: Never Smoker  . Smokeless tobacco: Never Used  Substance Use Topics  . Alcohol use: Never    Frequency: Never  . Drug use: Never     Allergies   Patient has no known allergies.   Review of Systems Review of Systems  Constitutional: Positive  for activity change.  HENT: Positive for trouble swallowing and voice change. Negative for sore throat.   Respiratory: Negative for shortness of breath.   Cardiovascular: Negative for chest pain.  All other systems reviewed and are negative.    Physical Exam Updated Vital Signs BP 139/77 (BP Location: Right Arm)   Pulse 74   Temp 98.4 F (36.9 C) (Oral)   Resp 17   Ht 5' 8.75" (1.746 m)   Wt 92.5 kg   SpO2 98%   BMI 30.35 kg/m   Physical Exam Vitals signs and nursing note reviewed.  Constitutional:      Appearance: He is well-developed.  HENT:     Head: Atraumatic.     Mouth/Throat:     Comments: Oral exam is completely normal.  There is no tongue or uvular edema.  Additionally we do not appreciate thyromegaly Neck:     Musculoskeletal: Neck supple.  Cardiovascular:     Rate and Rhythm: Normal rate.  Pulmonary:     Effort: Pulmonary effort is normal.  Skin:    General: Skin is warm.  Neurological:     Mental Status: He is alert and oriented to person, place, and time.      ED Treatments / Results  Labs (all labs ordered are listed, but only abnormal results are displayed) Labs Reviewed - No data to display  EKG None  Radiology No results found.  Procedures Procedures (including critical care time)  Medications Ordered in ED Medications - No data to display   Initial Impression / Assessment and Plan / ED Course  I have reviewed the triage vital signs and the nursing notes.  Pertinent labs & imaging results that were available during my care of the patient were reviewed by me and considered in my medical decision making (see chart for details).        48 year old male comes in with chief complaint of dysphagia.  He started having dysphagia on 4:30 PM and his symptoms have gradually resolved.  It appears that he has been having intermittent episodes of this nature over the last several months, typically lasting for 30 minutes.  He has known history  of Parkinson's disease.  I do not suspect that his symptoms are because of allergic reaction, mechanical obstruction due to polyp or tumors, infection or even thrombosis.  Suspicion is high that the symptoms are because of Parkinson's disorder.  I discussed my concerns with the patient and he feels comfortable with outpatient neurology follow-up.  Strict ER return precautions have been discussed with the patient.  Final Clinical Impressions(s) / ED Diagnoses   Final diagnoses:  Parkinsonian features  Esophageal dysphagia    ED Discharge Orders    None       Derwood Kaplan, MD 10/26/18 2309

## 2018-10-26 NOTE — ED Notes (Signed)
Pt called to be put in a room. Pt stated "Can you check my pressure; if it's good I wanna leave." Pt's blood pressure was 184/121 (141). Pt requested to be seen.

## 2018-10-26 NOTE — ED Triage Notes (Signed)
Pt noticed swelling in tongue since 1645 today. Pt states it feels like the swelling is going down. Pt is also concerned about his blood pressure. Pt states he has headache.

## 2018-10-26 NOTE — Discharge Instructions (Addendum)
We saw in the ER for difficulty in swallowing. It appears to [place] that this could be a sequelae of your Parkinson's disease, and the best neck step would be to see your neurologist for further recommendations. If the neurologist thinks that this is not a primary neurologic issue then you might want to consider seeing your primary care doctor or a GI doctor whose information has also been provided.

## 2018-10-27 ENCOUNTER — Telehealth: Payer: Self-pay | Admitting: Neurology

## 2018-10-27 DIAGNOSIS — R1319 Other dysphagia: Secondary | ICD-10-CM

## 2018-10-27 NOTE — Telephone Encounter (Signed)
Order MBE if hasn't had done (don't see that).

## 2018-10-27 NOTE — Telephone Encounter (Signed)
Tried to call patient to make sure he is agreeable to [facility]. No answer, no vm. Will wait to hear back from patient.

## 2018-10-27 NOTE — Telephone Encounter (Signed)
Patient was seen last night at [facility] for his high blood pressure: 182/119 highest it got. And some other things going on. Please call him back at [phone]. Thanks! I made him a F/U appt per the ER request in April next available.Marland Kitchen

## 2018-10-27 NOTE — Telephone Encounter (Signed)
ER notes in system. It looks like he was having issues with swallowing that has been intermittently happening for awhile. Dr. Arbutus Leas please review and advise.

## 2018-10-28 ENCOUNTER — Other Ambulatory Visit (HOSPITAL_COMMUNITY): Payer: Self-pay

## 2018-10-28 DIAGNOSIS — R1310 Dysphagia, unspecified: Secondary | ICD-10-CM

## 2018-10-28 NOTE — Telephone Encounter (Signed)
Spoke with patient. He is okay with MBE. Will set up and call him back.

## 2018-10-28 NOTE — Telephone Encounter (Signed)
We have scheduled you at [facility] for your modified barium swallow on Tuesday, March 17 at 11am. Please arrive 15 minutes prior and go to 1st floor radiology. Use valet parking entrance A, go to guest services to guidance to radiology.  If you need to reschedule for any reason please call [phone].   Patient aware.

## 2018-11-04 ENCOUNTER — Ambulatory Visit: Payer: 59 | Admitting: Psychology

## 2018-11-04 ENCOUNTER — Telehealth: Payer: Self-pay | Admitting: Neurology

## 2018-11-04 DIAGNOSIS — F4321 Adjustment disorder with depressed mood: Secondary | ICD-10-CM | POA: Diagnosis not present

## 2018-11-04 NOTE — Telephone Encounter (Signed)
Just filled 10/24/2018 by [name]. Called patient and made him aware. He states he must have been shorted at the pharmacy. I let him know that he would have to discuss this with the pharmacy.

## 2018-11-04 NOTE — Telephone Encounter (Signed)
Patient is calling in about a refill for the Clonazepam to the [facility] on [address]. Please send. Thanks!

## 2018-11-15 ENCOUNTER — Other Ambulatory Visit (HOSPITAL_COMMUNITY): Payer: Self-pay

## 2018-11-15 ENCOUNTER — Other Ambulatory Visit: Payer: Self-pay

## 2018-11-15 ENCOUNTER — Ambulatory Visit (HOSPITAL_COMMUNITY)
Admission: RE | Admit: 2018-11-15 | Discharge: 2018-11-15 | Disposition: A | Discharge status: Home / Self Care | Payer: 59 | Source: Ambulatory Visit | Attending: Neurology | Admitting: Neurology

## 2018-11-15 DIAGNOSIS — R1310 Dysphagia, unspecified: Secondary | ICD-10-CM

## 2018-11-15 DIAGNOSIS — R1319 Other dysphagia: Secondary | ICD-10-CM | POA: Insufficient documentation

## 2018-11-15 NOTE — Progress Notes (Signed)
  Modified Barium Swallow Progress Note  Patient Details  Name: Ruben Silva MRN: [id number] Date of Birth: 04-01-1971  Today's Date: 11/15/2018  Modified Barium Swallow completed.  Full report located under Chart Review in the Imaging Section.  Brief recommendations include the following:  Clinical Impression  Pt presents with functional oropharyngeal swallowing.  There was no penetration or aspiration of any consistency trialed.  There was trace to mild oral and pharyngeal residue which spontaneously cleared with subsequent swallows.  Suspect primary esophageal dysphagia.  There was prolonged esophageal stasis of barium tablet after the swallow on esophageal sweep.  Liquid wash was effective to clear tablet from the esophagus, but trasit to stomach was slow with liquid wash.  Recommend follow up with GI to evaluate for esophageal dysphagia/dysmotility.   Swallow Evaluation Recommendations   Recommended Consults: Consider GI evaluation;Consider esophageal assessment   SLP Diet Recommendations: Regular solids;Thin liquid   Liquid Administration via: Cup;Straw   Medication Administration: Whole meds with liquid   Supervision: Patient able to self feed   Compensations: Follow solids with liquid(Esophageal precuations)   Postural Changes: Seated upright at 90 degrees;Remain semi-upright after after feeds/meals (Comment)   Oral Care Recommendations: Oral care BID        Kerrie Pleasure, MA, CCC-SLP Acute Rehabilitation Services Office: [phone] 11/15/2018,12:18 PM

## 2018-11-18 ENCOUNTER — Ambulatory Visit: Payer: 59 | Admitting: Psychology

## 2018-12-02 ENCOUNTER — Ambulatory Visit: Payer: Self-pay | Admitting: Psychology

## 2018-12-07 NOTE — Progress Notes (Signed)
  Pt was called for video visit by staff immediately before the visit.  He answered and history was reviewed with staff.  He had trouble getting his vitals (not sure the BP is accurate because of this).  He hung up and was "invited" to the evisit by myself.  He never responded to the invite.  I personally called him x 2 and left VM.  He did not answer and did not ever arrive to the video visit.  Pt will need to r/s.

## 2018-12-08 ENCOUNTER — Encounter: Payer: Self-pay | Admitting: Neurology

## 2018-12-08 ENCOUNTER — Other Ambulatory Visit: Payer: Self-pay

## 2018-12-08 ENCOUNTER — Telehealth (INDEPENDENT_AMBULATORY_CARE_PROVIDER_SITE_OTHER): Payer: 59 | Admitting: Neurology

## 2018-12-16 ENCOUNTER — Ambulatory Visit (INDEPENDENT_AMBULATORY_CARE_PROVIDER_SITE_OTHER): Payer: 59 | Admitting: Psychology

## 2018-12-16 DIAGNOSIS — F4321 Adjustment disorder with depressed mood: Secondary | ICD-10-CM | POA: Diagnosis not present

## 2018-12-19 ENCOUNTER — Ambulatory Visit: Payer: Self-pay | Admitting: Neurology

## 2019-01-02 ENCOUNTER — Other Ambulatory Visit: Payer: Self-pay | Admitting: Neurology

## 2019-01-04 ENCOUNTER — Telehealth: Payer: Self-pay | Admitting: Neurology

## 2019-01-04 NOTE — Telephone Encounter (Signed)
Called pt --stated having trouble sleeping for about 2 weeks. Pt taking Rx clonazepam 1mg ..the patient questioning needed to go up on the dose?

## 2019-01-04 NOTE — Telephone Encounter (Signed)
No.  I have lowered that dose with time and cannot increase the dosage.  His trouble with sleep is likely more related to stress/anxiety.  Is he in counseling?  Would he consider it if he is not in it.

## 2019-01-04 NOTE — Telephone Encounter (Signed)
Great!!  Have him add melatonin, 3 mg, at night for sleep but otherwise, his issues there are mostly anxiety related

## 2019-01-04 NOTE — Telephone Encounter (Signed)
Patient is having trouble sleeping now and needs to talk to someone about what he can do please call

## 2019-01-04 NOTE — Telephone Encounter (Signed)
Called pt stated-- seeing Maggie Font  every 2 weeks

## 2019-01-04 NOTE — Telephone Encounter (Signed)
Notified pt ---get the OTC melatonin 3 mg--at night. Pt stated will try yet and hoping the meds will help.

## 2019-01-06 ENCOUNTER — Ambulatory Visit: Payer: Self-pay | Admitting: Neurology

## 2019-01-10 ENCOUNTER — Other Ambulatory Visit: Payer: Self-pay | Admitting: Neurology

## 2019-01-10 NOTE — Telephone Encounter (Signed)
Received a RF request for klonopin.  Noted that [name] filled on 2/24 with 5 refills.  Let pt know that he should not be out of refills since she filled it until July but also that I cannot refill medication when he is using 2 different prescribers for a controlled substance.  In addition, PMP aware reviewed and I wrote klonopin RX on 12/5 and [name] wrote it on 12/6 ([place] they were filled fairly appropriately at monthly intervals).  Please let pt know that I won't be able to fill that medication for him any longer.

## 2019-01-12 ENCOUNTER — Ambulatory Visit: Payer: Self-pay

## 2019-01-12 ENCOUNTER — Ambulatory Visit: Payer: Self-pay | Admitting: Occupational Therapy

## 2019-01-12 ENCOUNTER — Ambulatory Visit: Payer: Self-pay | Admitting: Physical Therapy

## 2019-01-17 ENCOUNTER — Telehealth: Payer: 59 | Admitting: Neurology

## 2019-01-17 ENCOUNTER — Other Ambulatory Visit: Payer: Self-pay

## 2019-01-17 NOTE — Progress Notes (Signed)
Virtual Visit via Video Note The purpose of this virtual visit is to provide medical care while limiting exposure to the novel coronavirus.    Consent was obtained for video visit:  Yes.   Answered questions that patient had about telehealth interaction:  Yes.   I discussed the limitations, risks, security and privacy concerns of performing an evaluation and management service by telemedicine. I also discussed with the patient that there may be a patient responsible charge related to this service. The patient expressed understanding and agreed to proceed.  Pt location: Home Physician Location: home Name of referring provider:  Barbarann EhlersGordon, Sarah B, PA-C I connected with Ruben Lamasharles Lamont Boss at patients initiation/request on 01/19/2019 at 11:15 AM EDT by video enabled telemedicine application and verified that I am speaking with the correct person using two identifiers. Pt MRN:  [id number] Pt DOB:  06-27-1971 Video Participants:  Ruben Silva;     History of Present Illness:  Patient seen today in follow-up for Parkinson's disease.  Patient is on Stalevo, 200 mg, 1 tablet 5 times per day.  Patient is also on rotigotine patch, 4 mg daily.  He does not wear the patch for 24 hours long.  He removes the patch every night and takes it off for the entire night, as he thinks it causes him to be awake at night.  states that he is noting rash at site.  Not using the flonase.  On amitiza for the constipation.  On losartan and propranolol for the BP.  He needs his FMLA paperwork completed.  States that he actually almost never takes off, but wants it for protection.  Modified barium swallow was done and completed on November 15, 2018.  I have reviewed this.  This was a normal study.  Patient was in the emergency room at [facility] on December 08, 2018.  Records indicate that they had a difficult time understanding exactly what his chief complaint was.  According to emergency room records, he "changes his story  every time he is asked a question."  Ultimately, his emergency room work-up for "fatigue" was unremarkable.  Patient was seen on Jan 16, 2019 for right arm pain by his primary physician assistant.   Current Outpatient Medications on File Prior to Visit  Medication Sig Dispense Refill  . AMITIZA 24 MCG capsule Take 24 mcg by mouth daily as needed for constipation.    . carbidopa-levodopa-entacapone (STALEVO) 50-200-200 MG tablet TAKE 1 TABLET BY MOUTH 5 (FIVE) TIMES DAILY. EVERY 3 HOURS 150 tablet 4  . clonazePAM (KLONOPIN) 1 MG tablet Take 1 tablet (1 mg total) by mouth at bedtime. 30 tablet 5  . diclofenac (VOLTAREN) 75 MG EC tablet Take 75 mg by mouth 2 (two) times daily.    Marland Kitchen. doxycycline (VIBRA-TABS) 100 MG tablet Take 100 mg by mouth 2 (two) times daily.    Marland Kitchen. losartan (COZAAR) 100 MG tablet Take 100 mg by mouth daily.    Marland Kitchen. neomycin-polymyxin b-dexamethasone (MAXITROL) 3.5-10000-0.1 OINT 1 application.    Marland Kitchen. NEUPRO 4 MG/24HR APPLY 1 PATCH ONTO THE SKIN EVERY DAY 30 patch 4  . propranolol (INDERAL) 40 MG tablet TAKE 2 TABLETS TWICE A DAY 120 tablet 5  . sildenafil (REVATIO) 20 MG tablet Take 1 tablet (20 mg total) by mouth 3 (three) times daily. 10 tablet 0   No current facility-administered medications on file prior to visit.      Observations/Objective:   Vitals:   01/19/19 1055  Weight: 208 lb (94.3  kg)  Height:  (1.727 m)   GEN:  The patient appears stated age and is in NAD.  Neurological examination:  Orientation: The patient is alert and oriented x3. Cranial nerves: There is good facial symmetry. There is mild facial hypomimia.  The speech is fluent and clear. Soft palate rises symmetrically and there is no tongue deviation. Hearing is intact to conversational tone. Motor: Strength is at least antigravity x 4.   Shoulder shrug is equal and symmetric.  There is no pronator drift.  Movement examination: Tone: unable Abnormal movements: none seen but didn't see hands at  rest a lot Coordination:  There is no decremation with RAM's, with hand opening and closing, finger taps bilaterally.  Unable to see his feet. Gait and Station: The patient walks well in the home.    Assessment and Plan:   1.  Idiopathic Parkinson's disease, diagnosed in 2016 with symptoms to 2015             -He will continue Stalevo 200, 1 tablet 5 times per day              -Doing well with rotigotine, 4 mg daily.  He is removing the patch at night, which I told him not to do, but he insists that it causes him to be awake at night and insists it works this way.  States that he has some rash at the site.  Told him to spray over-the-counter Flonase at the site and let it dry and then place the patch on it.             -he is in amorphine study for sublingual Apomorphine.  He uses this every few days.  He does find it beneficial.  He will continue to follow with [facility] for that.             -His FMLA paperwork was filled out today.  -Talked about the importance of compliance with follow-up visits.  He has been with me a very short period of time and has already missed several visits.    2.  Depression and anxiety             -He is in counseling with Maggie Font and I congratulated him on that.  -Do think he would benefit from psychiatry, and he agrees with that.  Will refer to Dr. Jennelle Human.  3.  constipation             -Now on amities a  4.  Insomnia and anxiety  -Was using clonazepam twice daily.  Has backed down to daily.  However, I got a refill request for this and he had gotten a refill request from another provider on February 24 with 5 refills (it does appear he was using this appropriately and just filling monthly).  Discussed with the patient that I would not be able to prescribe him his clonazepam any longer..  He has enough refills until July.  -pt is seeing Maggie Font for counseling  -told him again to add melatonin at night.  Follow Up Instructions:  4-5 months  -I  discussed the assessment and treatment plan with the patient. The patient was provided an opportunity to ask questions and all were answered. The patient agreed with the plan and demonstrated an understanding of the instructions.   The patient was advised to call back or seek an in-person evaluation if the symptoms worsen or if the condition fails to improve as anticipated.  Total Time spent in visit with the patient was:  20 min.  Pt understands and agrees with the plan of care outlined.     Kerin Salen, DO

## 2019-01-19 ENCOUNTER — Telehealth (INDEPENDENT_AMBULATORY_CARE_PROVIDER_SITE_OTHER): Payer: 59 | Admitting: Neurology

## 2019-01-19 ENCOUNTER — Encounter: Payer: Self-pay | Admitting: Neurology

## 2019-01-19 ENCOUNTER — Other Ambulatory Visit: Payer: Self-pay

## 2019-01-19 ENCOUNTER — Telehealth: Payer: Self-pay

## 2019-01-19 DIAGNOSIS — F419 Anxiety disorder, unspecified: Secondary | ICD-10-CM

## 2019-01-19 DIAGNOSIS — G20 Parkinson's disease: Secondary | ICD-10-CM

## 2019-01-19 DIAGNOSIS — G20A1 Parkinson's disease without dyskinesia, without mention of fluctuations: Secondary | ICD-10-CM

## 2019-01-19 NOTE — Telephone Encounter (Signed)
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

## 2019-01-19 NOTE — Telephone Encounter (Signed)
Referral placed Fax to Dr. Jennelle Human office

## 2019-01-20 ENCOUNTER — Telehealth: Payer: Self-pay | Admitting: Neurology

## 2019-01-20 NOTE — Telephone Encounter (Signed)
I told him yesterday to get OTC flonase and spray it on skin, let it dry and then place patch on.  Honestly, he should take the patch off before getting in shower.  Wash the skin.  Then get out and completely dry the skin.  Spray the flonase on the skin.  Let it dry and then place the patch on for 24 hours.

## 2019-01-20 NOTE — Telephone Encounter (Signed)
Called patient he was informed of provider response below Aware and voice understands

## 2019-01-20 NOTE — Telephone Encounter (Signed)
Patient has the Neupro patch and it burns him when he gets in the shower please call him with the name of the stuff that will him with that

## 2019-01-20 NOTE — Telephone Encounter (Signed)
See below. Please advise.  

## 2019-01-24 ENCOUNTER — Telehealth: Payer: Self-pay | Admitting: Neurology

## 2019-01-24 NOTE — Telephone Encounter (Signed)
FMLA was fax same day of appt once provider complete form. Received successful confirmation fax Called patient he was made aware of that as well as how to apply patch  Aware and voice understands

## 2019-01-24 NOTE — Telephone Encounter (Signed)
We have addressed this previously, many times.  See previous message about this.  It should not be on while he is in the shower.  Is he having trouble remembering the instructions we are giving him?  Needs to write it down if so.

## 2019-01-24 NOTE — Telephone Encounter (Signed)
Patient called regarding needing to check the status of his FMLA papers. Please Call. Thanks

## 2019-01-24 NOTE — Telephone Encounter (Signed)
Patient also called regarding his Neupro Patch burning when in the shower. Thanks

## 2019-01-26 ENCOUNTER — Ambulatory Visit: Payer: 59 | Admitting: Physical Therapy

## 2019-01-26 ENCOUNTER — Ambulatory Visit: Payer: 59 | Attending: Physician Assistant | Admitting: Occupational Therapy

## 2019-01-26 ENCOUNTER — Other Ambulatory Visit: Payer: Self-pay

## 2019-01-26 DIAGNOSIS — R29818 Other symptoms and signs involving the nervous system: Secondary | ICD-10-CM

## 2019-01-26 NOTE — Therapy (Signed)
[facility] [address] [place], [place], [address] Phone: [phone]   Fax:  [phone]  Patient Details  Name: Ruben Silva MRN: [id number] Date of Birth: [birth year] Referring Provider:  [name]  Encounter Date: 01/26/2019   Physical Therapy Parkinson's Disease Screen   Timed Up and Go test:13.06  10 meter walk test: 12.03 sec = 2.73 ft/sec  5 time sit to stand test: 10.19 sec  Patient does not require Physical Therapy services at this time.  Recommend Physical Therapy screen in 6-9 months.  Asked speech-related questions:  No excess drooling, no coughing with meals/drinks, people do not have to ask him to repeat himself. He does not feel he needs speech at this time.     Ruben Silva W. 01/26/2019, 8:35 AM Lonia Blood, PT 01/26/19 8:44 AM Phone: [phone] Fax: [phone]  [facility] [address] [place], [place], [address] Phone: [phone]   Fax:  [phone]

## 2019-01-26 NOTE — Therapy (Signed)
[facility] [address] [place], [place], [address] Phone: [phone]   Fax:  [phone]  Patient Details  Name: Ruben Silva MRN: [id number] Date of Birth: [birth year] Referring Provider:  Jordan Hawks, PA-C  Encounter Date: 01/26/2019  Occupational Therapy Parkinson's Disease Screen  Hand dominance:  right   Physical Performance Test item #4 (donning/doffing jacket):  12.41 sec  9-hole peg test:    RUE  19.91 sec        LUE  26.84 sec  Box & Blocks Test:   RUE  66 blocks        LUE  65 blocks  Change in ability to perform ADLs/IADLs:  No change  Other Comments:  Recommended pt slow down, particularly with L hand due to more drops   Pt does not require occupational therapy services at this time.  Recommended occupational therapy screen in   6-8 months    [facility] 01/26/2019, 8:57 AM  [facility] [address] [place], [place], [address] Phone: [phone]   Fax:  [phone]   Willa Frater, OTR/L [facility] [address] [place], [place]  [address] [phone] phone [phone] 01/26/19 9:14 AM

## 2019-02-05 ENCOUNTER — Encounter (HOSPITAL_COMMUNITY): Payer: Self-pay | Admitting: Emergency Medicine

## 2019-02-05 ENCOUNTER — Ambulatory Visit (HOSPITAL_COMMUNITY)
Admission: EM | Admit: 2019-02-05 | Discharge: 2019-02-05 | Disposition: A | Discharge status: Home / Self Care | Payer: 59 | Attending: Family Medicine | Admitting: Family Medicine

## 2019-02-05 ENCOUNTER — Other Ambulatory Visit: Payer: Self-pay

## 2019-02-05 DIAGNOSIS — J039 Acute tonsillitis, unspecified: Secondary | ICD-10-CM

## 2019-02-05 MED ORDER — AMOXICILLIN 400 MG/5ML PO SUSR: 800.0000 mg | Freq: Two times a day (BID) | ORAL | 0 refills | Status: AC

## 2019-02-05 NOTE — ED Provider Notes (Signed)
[place]    CSN: [id number] Arrival date & time: 02/05/19  1103     History   Chief Complaint Chief Complaint  Patient presents with  . Sore Throat    HPI Ruben Silva is a 48 y.o. male.   HPI  Patient is here for sore throat.  He states he been having a sore throat for over a week.  He states that he went to his doctor and had a throat culture.  Rapid strep was negative.  Culture was negative.  They gave him a mouthwash with Benadryl and lidocaine in it.  He has been using Tylenol for pain.  He is here today because his sore throat is getting worse.  He is having pain with swallowing.  Today he has a low-grade temperature.  He states that it is been over a week, is getting worse, he is not understand why. Patient has Parkinson's disease.  He is on multiple medications.  He has a very faint voice and is difficult to hear if 1 does not concentrate and get up close.  This is further made difficult with a mask.  I do believe I got accurate information from him. He has no exposure to strep or illness.  He lives alone.  He has been staying home because of the COVID-19 pandemic  Past Medical History:  Diagnosis Date  . Bell's palsy   . Constipation   . Erectile dysfunction   . History of stomach ulcers   . Hypertension   . Insomnia   . Knee pain   . Murmur   . Parkinson disease ([place])   . Parkinsons ([place])   . Weakness generalized     Patient Active Problem List   Diagnosis Date Noted  . Right lumbar radiculopathy 12/27/2017  . Gastroparesis 07/06/2017  . Orthostatic dizziness 05/12/2016  . Low back pain 05/12/2016  . Chronic constipation 12/17/2015  . Chronic insomnia 12/17/2015  . Left hip pain 12/16/2015  . Parkinson's disease ([place]) 09/11/2015  . HYPERTENSION 11/21/2008  . MURMUR 11/21/2008  . CHEST PAIN, ATYPICAL 11/21/2008    Past Surgical History:  Procedure Laterality Date  . NO PAST SURGERIES         Home Medications    Prior to  Admission medications   Medication Sig Start Date End Date Taking? Authorizing Provider  AMITIZA 24 MCG capsule Take 24 mcg by mouth daily as needed for constipation. 10/14/18   [provider]  amoxicillin (AMOXIL) 400 MG/5ML suspension Take 10 mLs (800 mg total) by mouth 2 (two) times daily for 10 days. 02/05/19 02/15/19  Raylene Everts, MD  carbidopa-levodopa-entacapone (STALEVO) 50-200-200 MG tablet TAKE 1 TABLET BY MOUTH 5 (FIVE) TIMES DAILY. EVERY 3 HOURS 09/23/18   Marcial Pacas, MD  clonazePAM (KLONOPIN) 1 MG tablet Take 1 tablet (1 mg total) by mouth at bedtime. 10/24/18   Marcial Pacas, MD  diclofenac (VOLTAREN) 75 MG EC tablet Take 75 mg by mouth 2 (two) times daily.    [provider]  losartan (COZAAR) 100 MG tablet Take 100 mg by mouth daily. 09/30/18   [provider]  NEUPRO 4 MG/24HR APPLY 1 PATCH ONTO THE SKIN EVERY DAY 01/02/19   Tat, Eustace Quail, DO  propranolol (INDERAL) 40 MG tablet TAKE 2 TABLETS TWICE A DAY 10/18/18   Marcial Pacas, MD  sildenafil (REVATIO) 20 MG tablet Take 1 tablet (20 mg total) by mouth 3 (three) times daily. 01/10/18   Sater, Nanine Means, MD  Family History Family History  Problem Relation Age of Onset  . Hypertension Mother 463  . Liver disease Mother   . Hypercholesterolemia Mother   . Heart attack Mother   . Stroke Father   . Hypertension Father   . Diabetes Father   . Dementia Father   . Diabetes Sister     Social History Social History   Tobacco Use  . Smoking status: Never Smoker  . Smokeless tobacco: Never Used  Substance Use Topics  . Alcohol use: Never    Frequency: Never  . Drug use: Never     Allergies   Patient has no known allergies.   Review of Systems Review of Systems  Constitutional: Positive for fever. Negative for chills.  HENT: Positive for sore throat and trouble swallowing. Negative for ear pain.   Eyes: Negative for pain and visual disturbance.  Respiratory: Negative for cough and shortness of  breath.   Cardiovascular: Negative for chest pain and palpitations.  Gastrointestinal: Negative for abdominal pain and vomiting.  Genitourinary: Negative for dysuria and hematuria.  Musculoskeletal: Negative for arthralgias and back pain.  Skin: Negative for color change and rash.  Neurological: Negative for seizures and syncope.  All other systems reviewed and are negative.    Physical Exam Triage Vital Signs ED Triage Vitals  Enc Vitals Group     BP 02/05/19 1121 132/83     Pulse Rate 02/05/19 1121 81     Resp 02/05/19 1121 16     Temp 02/05/19 1121 99.1 F (37.3 C)     Temp Source 02/05/19 1121 Oral     SpO2 02/05/19 1121 98 %     Weight --      Height --      Head Circumference --      Peak Flow --      Pain Score 02/05/19 1122 7     Pain Loc --      Pain Edu? --      Excl. in GC? --    No data found.  Updated Vital Signs BP 132/83 (BP Location: Right Arm)   Pulse 81   Temp 99.1 F (37.3 C) (Oral)   Resp 16   SpO2 98%     Physical Exam Constitutional:      General: He is not in acute distress.    Appearance: He is well-developed.     Comments: Moves slowly.  Small steps.  Faint voice.  Pleasant  HENT:     Head: Normocephalic and atraumatic.     Right Ear: Tympanic membrane and ear canal normal.     Left Ear: Tympanic membrane and ear canal normal.     Mouth/Throat:     Mouth: Mucous membranes are moist.     Pharynx: Uvula midline. Posterior oropharyngeal erythema present. No pharyngeal swelling.     Tonsils: No tonsillar exudate or tonsillar abscesses. 1+ on the right. 1+ on the left.  Eyes:     Conjunctiva/sclera: Conjunctivae normal.     Pupils: Pupils are equal, round, and reactive to light.  Neck:     Musculoskeletal: Normal range of motion.  Cardiovascular:     Rate and Rhythm: Normal rate and regular rhythm.  Pulmonary:     Effort: Pulmonary effort is normal. No respiratory distress.     Breath sounds: Normal breath sounds.  Abdominal:      General: There is no distension.     Palpations: Abdomen is soft.  Musculoskeletal: Normal range of motion.  Lymphadenopathy:     Cervical: Cervical adenopathy present.  Skin:    General: Skin is warm and dry.  Neurological:     Mental Status: He is alert.  Psychiatric:        Mood and Affect: Mood normal.        Behavior: Behavior normal.      UC Treatments / Results  Labs (all labs ordered are listed, but only abnormal results are displayed) Labs Reviewed - No data to display  EKG None  Radiology No results found.  Procedures Procedures (including critical care time)  Medications Ordered in UC Medications - No data to display  Initial Impression / Assessment and Plan / UC Course  I have reviewed the triage vital signs and the nursing notes.  Pertinent labs & imaging results that were available during my care of the patient were reviewed by me and considered in my medical decision making (see chart for details).     Patient has a tonsillitis.  Tonsils have exudate.  He had a negative strep test a week ago.  Since he still has symptoms I am going to treat him with penicillin antibiotic.  He is advised to call his physician if not improved over the next 48 to 72 hours Final Clinical Impressions(s) / UC Diagnoses   Final diagnoses:  Tonsillitis     Discharge Instructions     Take antibiotic 2 times a day Continue your current value of fluids Consider Chloraseptic spray or sore throat lozenge for throat pain Call your doctor if not improving by next week   ED Prescriptions    Medication Sig Dispense Auth. Provider   amoxicillin (AMOXIL) 400 MG/5ML suspension Take 10 mLs (800 mg total) by mouth 2 (two) times daily for 10 days. 200 mL Eustace MooreNelson, Yvonne Sue, MD     Controlled Substance Prescriptions [place] Controlled Substance Registry consulted? Not Applicable   Eustace MooreNelson, Yvonne Sue, MD 02/05/19 [phone]

## 2019-02-05 NOTE — ED Triage Notes (Signed)
Per pt he has been having a sore throat for 1 week. Was swabbed on Tuesday and cultured and were negative. Low grade temp 99.1. Pt says he cant eat bc of throat.

## 2019-02-05 NOTE — Discharge Instructions (Signed)
Take antibiotic 2 times a day Continue your current value of fluids Consider Chloraseptic spray or sore throat lozenge for throat pain Call your doctor if not improving by next week

## 2019-02-13 ENCOUNTER — Telehealth: Payer: Self-pay | Admitting: Neurology

## 2019-02-13 NOTE — Telephone Encounter (Signed)
Pt aware will discuss during telehealth visit.

## 2019-02-13 NOTE — Telephone Encounter (Signed)
Called spoke with patient he states that his positive Covid 19 test is effecting his PARKINSON DISEASES.  He states that its not related to [facility] paperwork. Pt c/o motor skills off, feeling stiff, and sob.  Pt states that he has seen PCP for HOS F/U for positive covid 19 today. He wanted to follow up with symptoms related to PD and how covid 19 effects this. Pt has been reading online websites

## 2019-02-13 NOTE — Telephone Encounter (Signed)
We can discuss if he would like but won't likely change PD meds while otherwise sick

## 2019-02-13 NOTE — Telephone Encounter (Signed)
[place], I see patient on my schedule for Friday re: FMLA paperwork.  I have already filled out his FMLA paperwork from neuro standpoint.  I do see he has been dealing with Covid/ST from covid but will need PCP to fill out paperwork related to that as I cannot revise FMLA I just filled out 2-3 weeks ago when I saw him

## 2019-02-16 NOTE — Progress Notes (Signed)
Virtual Visit via Video Note The purpose of this virtual visit is to provide medical care while limiting exposure to the novel coronavirus.    Consent was obtained for video visit:  Yes.   Answered questions that patient had about telehealth interaction:  Yes.   I discussed the limitations, risks, security and privacy concerns of performing an evaluation and management service by telemedicine. I also discussed with the patient that there may be a patient responsible charge related to this service. The patient expressed understanding and agreed to proceed.  Pt location: Home Physician Location: home Name of referring provider:  Juanda Chance I connected with Ruben Silva at patients initiation/request on 02/17/2019 at 11:15 AM EDT by video enabled telemedicine application and verified that I am speaking with the correct person using two identifiers. Pt MRN:  [id number] Pt DOB:  Oct 22, 1970 Video Participants:  Ruben Silva;     History of Present Illness:  Patient seen today in follow-up for Parkinson's disease.    Patient is currently on Stalevo 200, 1 tablet 5 times per day as well as rotigotine patch, 4 mg daily.  He is no longer removing the patch at night.  Medical records have been reviewed since last visit.  He went to hospital on February 05, 2019 with a sore throat and was diagnosed with tonsillitis and placed on antibiotics.  He was tested at an outside facility ([facility]) for Takilma on that same day and it turned out positive.  He went back to the emergency room on June 9 and he was admitted for 1 night for COVID associated pneumonia.  He did well.  He saw his primary care physician assistant on June 15 as a follow-up.  It looks like she recommended he go back to the hospital, but I do not see he did that.  States that he is actually feeling pretty good.  States that he had days where he was freezing and not walking well and "I have never had and it was  scary."  Over the last 2 days, he is doing much better.  States that his wife has been giving him "natural vitamins" and it has been helping him walk.  He asks me if he can be released to go back to work.  He was supposed to go to physical therapy yesterday, but given his COVID status, they have pushed him out for another month or so.   Previous medications: Requip, rasagiline, Stalevo, clonazepam, Neupro Current Outpatient Medications on File Prior to Visit  Medication Sig Dispense Refill  . AMITIZA 24 MCG capsule Take 24 mcg by mouth daily as needed for constipation.    . carbidopa-levodopa-entacapone (STALEVO) 50-200-200 MG tablet TAKE 1 TABLET BY MOUTH 5 (FIVE) TIMES DAILY. EVERY 3 HOURS 150 tablet 4  . clonazePAM (KLONOPIN) 1 MG tablet Take 1 tablet (1 mg total) by mouth at bedtime. 30 tablet 5  . diclofenac (VOLTAREN) 75 MG EC tablet Take 75 mg by mouth 2 (two) times daily.    Marland Kitchen losartan (COZAAR) 100 MG tablet Take 100 mg by mouth daily.    Marland Kitchen NEUPRO 4 MG/24HR APPLY 1 PATCH ONTO THE SKIN EVERY DAY 30 patch 4  . propranolol (INDERAL) 40 MG tablet TAKE 2 TABLETS TWICE A DAY 120 tablet 5  . sildenafil (REVATIO) 20 MG tablet Take 1 tablet (20 mg total) by mouth 3 (three) times daily. 10 tablet 0   No current facility-administered medications on file prior to  visit.      Observations/Objective:   Vitals:   02/17/19 0839  Weight: 190 lb (86.2 kg)  Height: 5\' 8"  (1.727 m)   GEN:  The patient appears stated age and is in NAD.  Neurological examination:  Orientation: The patient is alert and oriented x3. Cranial nerves: There is good facial symmetry. There is no significant facial hypomimia.  The speech is fluent and clear but somewhat hypophonic. Soft palate rises symmetrically and there is no tongue deviation. Hearing is intact to conversational tone. Motor: Strength is at least antigravity x 4.   Shoulder shrug is equal and symmetric.  There is no pronator drift.  Movement examination:  Tone: unable Abnormal movements: None Coordination: Patient is very slow when asked to perform any rapid alternating movement, but there is not necessarily any decremation. Gait and Station: The patient gets off of his couch very easily and walks quickly across the room with good stride length.    Assessment and Plan:   1. Idiopathic Parkinson's disease, diagnosed in 2016 with symptoms to 2015 -He will continue Stalevo 200, 1 tablet 5 times per day -Doing well with rotigotine, 4 mg daily. He is no longer removing the patch at night.  He is using the Flonase on the skin and it has helped. -he is in amorphine study for sublingual Apomorphine. He uses this every few days. He does find it beneficial. [facility]   2. Depression and anxiety -He is in counseling with Maggie FontKeith Cottle and I congratulated him on that.             -Do think he would benefit from psychiatry, and he agrees with that.  Will refer to Dr. Jennelle Humanottle.  3.constipation -Now on Amitiza  4.  Insomnia and anxiety             -On clonazepam daily             -pt is seeing Maggie FontKeith Cottle for counseling             -told him again to add melatonin at night.  5.  Recent COVID with COVID associated pneumonia  -Patient is recovering well at home.  -Patient feels that this made some of his Parkinson symptoms worse, which certainly could be the case.  However, discussed with patient that we would not change his medication for this.  Once his COVID resolves, the parkinsonian symptoms will get much better, and have already, without changing his medication load.  Discussed importance of rest, but also getting back into his home therapies (does zoom [facility]SB) when he feels he is able to do so.  -Asks me if he can be released to go back to work.  I told him that would be completely up to his primary care.  Follow Up Instructions:   Follow-up at his previously scheduled appointment.  -I discussed the assessment and treatment plan with the patient. The patient was provided an opportunity to ask questions and all were answered. The patient agreed with the plan and demonstrated an understanding of the instructions.   The patient was advised to call back or seek an in-person evaluation if the symptoms worsen or if the condition fails to improve as anticipated.    Total Time spent in visit with the patient was: 15 minutes.   Pt understands and agrees with the plan of care outlined.     Kerin Salenebecca Jaydalee Bardwell, DO

## 2019-02-17 ENCOUNTER — Other Ambulatory Visit: Payer: Self-pay

## 2019-02-17 ENCOUNTER — Other Ambulatory Visit: Payer: Self-pay | Admitting: Neurology

## 2019-02-17 ENCOUNTER — Telehealth (INDEPENDENT_AMBULATORY_CARE_PROVIDER_SITE_OTHER): Payer: 59 | Admitting: Neurology

## 2019-02-17 ENCOUNTER — Encounter: Payer: Self-pay | Admitting: Neurology

## 2019-02-17 DIAGNOSIS — U071 COVID-19: Secondary | ICD-10-CM | POA: Diagnosis not present

## 2019-02-17 DIAGNOSIS — G20 Parkinson's disease: Secondary | ICD-10-CM

## 2019-02-17 DIAGNOSIS — G20A1 Parkinson's disease without dyskinesia, without mention of fluctuations: Secondary | ICD-10-CM

## 2019-02-17 DIAGNOSIS — J988 Other specified respiratory disorders: Secondary | ICD-10-CM

## 2019-03-01 ENCOUNTER — Ambulatory Visit: Payer: Self-pay | Admitting: Podiatry

## 2019-03-10 ENCOUNTER — Other Ambulatory Visit: Payer: Self-pay

## 2019-03-10 ENCOUNTER — Encounter: Payer: Self-pay | Admitting: Podiatry

## 2019-03-10 ENCOUNTER — Ambulatory Visit: Payer: 59 | Admitting: Podiatry

## 2019-03-10 ENCOUNTER — Ambulatory Visit (INDEPENDENT_AMBULATORY_CARE_PROVIDER_SITE_OTHER): Payer: 59 | Admitting: Psychology

## 2019-03-10 DIAGNOSIS — M2042 Other hammer toe(s) (acquired), left foot: Secondary | ICD-10-CM | POA: Diagnosis not present

## 2019-03-10 DIAGNOSIS — F4321 Adjustment disorder with depressed mood: Secondary | ICD-10-CM

## 2019-03-10 NOTE — Progress Notes (Signed)
This patient presents the office concerned about his fifth toe under lying his fourth toe left foot.  He says this fifth toe is painful wearing certain shoes.  He denies any history of trauma or injury to the toe.  He presents the office today for an evaluation and treatment of this cold fifth toe left foot.  Vascular  Dorsalis pedis and posterior tibial pulses are palpable  B/L.  Capillary return  WNL.  Temperature gradient is  WNL.  Skin turgor  WNL  Sensorium  Senn Weinstein monofilament wire  WNL. Normal tactile sensation.  Nail Exam  Patient has normal nails with no evidence of bacterial or fungal infection.  Orthopedic  Exam  Muscle tone and muscle strength  WNL.  No limitations of motion feet  B/L.  No crepitus or joint effusion noted.  Foot type is unremarkable and digits show no abnormalities.  Bony prominences are unremarkable.  Adductovarus fifth digit hammertoe left foot.  Skin  No open lesions.  Normal skin texture and turgor.  Hammer toe fifth toe left foot.  ROV.  Discussed this condition with this patient.  Dispensed padding to help separate the fourth and fifth digits left foot.  Discussed possible surgical correction of this deformity in the future.  Return to clinic as needed.    Gardiner Barefoot DPM

## 2019-04-07 ENCOUNTER — Ambulatory Visit (INDEPENDENT_AMBULATORY_CARE_PROVIDER_SITE_OTHER): Payer: 59 | Admitting: Psychology

## 2019-04-07 DIAGNOSIS — F4321 Adjustment disorder with depressed mood: Secondary | ICD-10-CM | POA: Diagnosis not present

## 2019-04-21 ENCOUNTER — Ambulatory Visit: Payer: 59 | Admitting: Psychology

## 2019-04-27 DIAGNOSIS — Z0289 Encounter for other administrative examinations: Secondary | ICD-10-CM

## 2019-05-05 ENCOUNTER — Ambulatory Visit (INDEPENDENT_AMBULATORY_CARE_PROVIDER_SITE_OTHER): Payer: 59 | Admitting: Psychology

## 2019-05-05 DIAGNOSIS — F4321 Adjustment disorder with depressed mood: Secondary | ICD-10-CM

## 2019-05-19 ENCOUNTER — Other Ambulatory Visit: Payer: Self-pay | Admitting: Neurology

## 2019-05-22 ENCOUNTER — Other Ambulatory Visit: Payer: Self-pay | Admitting: Neurology

## 2019-05-22 NOTE — Telephone Encounter (Signed)
Patient called and is needing to see if he can get a refill on his medication prior to his appointment on Thursday? He said he is not sleeping at all and had to go to the ER due to BP issues. The medication is Clonazepam. Thanks

## 2019-05-22 NOTE — Telephone Encounter (Signed)
Provider denied patient will need to wait until appt on thursday

## 2019-05-22 NOTE — Telephone Encounter (Signed)
Requested Prescriptions   Pending Prescriptions Disp Refills  . clonazePAM (KLONOPIN) 1 MG tablet 30 tablet 5    Sig: Take 1 tablet (1 mg total) by mouth at bedtime.   Pt notified of provider response.

## 2019-05-22 NOTE — Telephone Encounter (Signed)
Please call pt.  Let him know (again) that I will refill this but he cannot go back and forth receiving it between Dr. Krista Blue and myself as he has been or I will not be able to RX it.  Please fill until next appt only

## 2019-05-22 NOTE — Telephone Encounter (Signed)
I have a sticky note in my chart to not refill clonazepam is this still valid?

## 2019-05-23 NOTE — Progress Notes (Signed)
Ruben Silva was seen today in the movement disorders clinic for neurologic consultation at the request of [name].  The consultation is for the evaluation of Parkinson's disease.  Prior neurology records are reviewed.  Patient has been under the care of [name].  Patient symptoms started in October, 2015 with gait change.  He presented to [name] in June, 2016 with slow, shuffling gait and tremors.  His initial starting dose of levodopa was "up to 2 tablets 3 times per day."  Levodopa did help.  He was referred to [name] in September, 2016.  He has been following both at [facility] as well as [facility].  At that time, [name] felt a levodopa sparing strategy was best, but did not change his levodopa.  He did recommend Neupro, but did not start it.  Patient was already on clonazepam and this was increased by [name] to 1 mg nightly.  He did try ropinirole in early 2017 but was not able to tolerate it because of dizziness and GI side effects.  At some point this is reintroduced (mid 2017) but seems to cause GI upset again.  Azilect was initiated but ultimately discontinued because of GI issues.  In June, 2017 he was changed from carbidopa/levodopa 25/100 to Stalevo.  He is currently on Stalevo 200, 1 tablet 5 times per day.  He is also on clonazepam, 2 mg at night.  He is on Mestinon, 60 mg 3 times per day.  He was last seen by [name] on June 02, 2018 at which point he was referred to [name].  He does have an appointment with [name], but this was with [name] on August 15, 2018  He last saw [name] in September, 2018.  However, he has been participating in a sublingual apomorphine study.  He has an appointment with [name] next month.  10/06/18 update:  Patient is seen today in follow-up for Parkinson's disease.  He missed his last appt with me in December.   I discontinued his Mestinon last visit, due to GI upset and the fact that he was placed on  antihypertensives.  He reports that GI upset has been okay except for constipation.  I started the patient on the Neupro patch and states that he is doing "pretty well" but takes it off at bedtime as he thinks that it affects sleep.  "I am fine through the night."  No compulsive behaviors.  No sleep attacks. Notes some L foot cramping when time for medication.  Some speech slurring but doesn't want to go to ST yet.   He is also on Stalevo 200, 1 tablet 5 times per day.  No falls.  No lightheadedness or near syncope.  He did call here since last visit, needing a refill on his clonazepam which he was taking for insomnia.  He was on 2 mg daily.  We discussed that this was a higher dose than I felt comfortable with.  I did give him a refill, but only of 1 mg daily.  Last visit, the patient talked a lot about depression and we give him counseling resources.  He reports today that he is going and he thinks that it is valuable.  He has attended rehab therapy since our last visit and those records are reviewed.  He is still participating in a sublingual apomorphine research trial through [facility] and was last there on June 28, 2018.  He ended up going back  to [facility] neurology.  He was seen by [name] on September 26, 2018.  C/o L chest pain.  States that saw PCP and told work up negative.  States that it "could be anxiety."  Has a stye for last few months that he plans to have opened/I&D by Dr. Dione Booze next week.  Having knee pain.  Had steroid injection.  Awaiting gel injections but awaiting.  Not been able to exercise as much because of this and cost.  He cannot do the PD cycle because of working still.  He feels a big difference when he cannot exercise.    05/25/19 update: Patient seen today in follow-up for Parkinson's disease.  I last saw him in telemedicine on February 17, 2019.  Patient is on Stalevo 200, 1 tablet 5 times per day.  He is also on the rotigotine patch, 4 mg daily.  He has had no compulsive behaviors or  sleep attacks.  Last visit, I did think that the patient would benefit from psychiatry for his anxiety and depression and referred him to Dr. Meredith Staggers (was already seeing Maggie Font for counseling and next appt is tomorrow).  I just called their office because it didn't appear that the patient had been contacted (and referral made in May).  Medical records have been reviewed since our last visit.  He was in the emergency room on May 18, 2019 with chest pain that turned out to be noncardiac in etiology.  He followed up through a telemedicine visit with his primary practitioner regarding elevated blood pressure.  He thought that it was elevated because of insomnia.  He was told to take hydroxyzine at bedtime.  Pt states that he thinks that the BP was elevated due to insomnia and once he started back on his klonopin "I slept like a baby."  States that he has had arm pain on the R for the last week and feels like it is "swollen."  Wonders if it is deodorant is using.  Has swallow trouble.  Mostly with pills.  Had modified barium swallow in March, which I reviewed with him.  No evidence of oropharyngeal dysphagia.  There was evidence of possible esophageal dysmotility.  He does see gastroenterology, and states that he probably needs to follow-up.  He does think that his reflux medication helps some.  PREVIOUS MEDICATIONS: Requip and Azilect (both with GI side effects and dizziness)  ALLERGIES:  No Known Allergies  CURRENT MEDICATIONS:  Outpatient Encounter Medications as of 05/25/2019  Medication Sig  . AMITIZA 24 MCG capsule Take 24 mcg by mouth daily as needed for constipation.  . carbidopa-levodopa-entacapone (STALEVO) 50-200-200 MG tablet TAKE 1 TABLET BY MOUTH 5 TIMES A DAY/EVERY 3 HOURS.  . clonazePAM (KLONOPIN) 1 MG tablet Take 1 tablet (1 mg total) by mouth at bedtime. Please call [phone] to schedule yearly appt.  . diclofenac (VOLTAREN) 75 MG EC tablet Take 75 mg by mouth 2 (two) times  daily.  Marland Kitchen losartan (COZAAR) 50 MG tablet TAKE ONE TABLET (50 MG DOSE) BY MOUTH 2 (TWO) TIMES DAILY. FOR BLOOD PRESSURE  . naproxen (NAPROSYN) 500 MG tablet Take by mouth.  . NEUPRO 4 MG/24HR APPLY 1 PATCH ONTO THE SKIN EVERY DAY  . propranolol (INDERAL) 40 MG tablet TAKE 2 TABLETS TWICE A DAY  . sildenafil (REVATIO) 20 MG tablet Take 1 tablet (20 mg total) by mouth 3 (three) times daily.  . tadalafil (CIALIS) 20 MG tablet TAKE 1 TABLET DAILY AS NEEDED FOR ERECTILE DYSFUNCTION  No facility-administered encounter medications on file as of 05/25/2019.     PAST MEDICAL HISTORY:   Past Medical History:  Diagnosis Date  . Bell's palsy   . Constipation   . Erectile dysfunction   . History of stomach ulcers   . Hypertension   . Insomnia   . Knee pain   . Murmur   . Parkinson disease (HCC)   . Parkinsons (HCC)   . Weakness generalized     PAST SURGICAL HISTORY:   Past Surgical History:  Procedure Laterality Date  . NO PAST SURGERIES      SOCIAL HISTORY:   Social History   Socioeconomic History  . Marital status: Married    Spouse name: Not on file  . Number of children: 2  . Years of education: College  . Highest education level: Master's degree (e.g., MA, MS, MEng, MEd, MSW, MBA)  Occupational History  . Occupation: Emergency planning/management officerroject Manager  Social Needs  . Financial resource strain: Not on file  . Food insecurity    Worry: Not on file    Inability: Not on file  . Transportation needs    Medical: Not on file    Non-medical: Not on file  Tobacco Use  . Smoking status: Never Smoker  . Smokeless tobacco: Never Used  Substance and Sexual Activity  . Alcohol use: Never    Frequency: Never  . Drug use: Never  . Sexual activity: Not on file  Lifestyle  . Physical activity    Days per week: Not on file    Minutes per session: Not on file  . Stress: Not on file  Relationships  . Social Musicianconnections    Talks on phone: Not on file    Gets together: Not on file    Attends  religious service: Not on file    Active member of club or organization: Not on file    Attends meetings of clubs or organizations: Not on file    Relationship status: Not on file  . Intimate partner violence    Fear of current or ex partner: Not on file    Emotionally abused: Not on file    Physically abused: Not on file    Forced sexual activity: Not on file  Other Topics Concern  . Not on file  Social History Narrative   ** Merged History Encounter **       Lives at home with wife and son. Right-hand. Occasional use of caffeine.    FAMILY HISTORY:   Family Status  Relation Name Status  . Mother  Deceased  . Father  Alive  . Sister 2 Alive  . Brother 2 Deceased  . Son  Alive  . Daughter  Alive    ROS:  Review of Systems  Constitutional: Negative.   HENT: Negative.   Eyes: Negative.   Gastrointestinal: Negative.   Genitourinary: Negative.   Musculoskeletal: Positive for joint pain (knee pain, right).  Skin: Negative.       PHYSICAL EXAMINATION:    VITALS:   Vitals:   05/25/19 1512  BP: 140/68  Pulse: 76  SpO2: 94%  Weight: 193 lb 12.8 oz (87.9 kg)    GEN:  The patient appears stated age and is in NAD.  He is anxious.  He has some flight of ideas. HEENT:  Normocephalic, atraumatic.  The mucous membranes are moist. The superficial temporal arteries are without ropiness or tenderness. CV:  RRR Lungs:  CTAB Neck/HEME:  There are no carotid bruits  bilaterally. Exts:  No edema.  No lymph nodes.    Neurological examination:  Orientation: The patient is alert and oriented x3. Cranial nerves: There is good facial symmetry.  He has facial hypomimia.  The speech is fluent and clear.  He is hypophonic.  Soft palate rises symmetrically and there is no tongue deviation. Hearing is intact to conversational tone. Sensation: Sensation is intact to light touch throughout Motor: Strength is 5/5 in the bilateral upper and lower extremities.   Shoulder shrug is equal and  symmetric.  There is no pronator drift.   Movement examination: Tone: There is increased tone in the left greater than right upper extremity. Abnormal movements: none Coordination:  There is decremation with virtually all rapid alternating movements, left greater than right. Gait and Station: The patient has no difficulty arising out of a deep-seated chair without the use of the hands. The patient's stride length is good today.    ASSESSMENT/PLAN:  1. Idiopathic Parkinson's disease, diagnosed in 2016 with symptoms to 2015 -He will continue Stalevo 200, 1 tablet 5 times per day -increase neupro, 6 mg daily.  Discussed extensively risk, benefits, and side effects including compulsive behaviors and sleep attacks. -he is in amorphine study for sublingual Apomorphine. He uses this every few days. He does find it beneficial. He will continue to follow with [facility] for that.  Records indicate that he is still in the study, although sublingual able morphine has already come to the market.   2. Depressionand anxiety -He is in counseling with Maggie Font and I congratulated him on that. -I think that his anxiety is really his most disabling feature.  I still think he needs psychiatry.  I sent a referral to Dr. Jennelle Human in May.  Unfortunately, apparently nobody works for work you, so nobody saw the referral.  I resent it through fax yesterday.  3.constipation -Now on Amitiza  4.Insomnia and anxiety -On clonazepam daily.  Long discussion with him about this.  I have talked to him about this before.  He has been going back and forth between [name] and myself are for refills.  He states that this has been a pharmacy mistake and not something he has been doing purposefully.  He has not been abusing it and has been going through the proper number per month.  However, the pharmacy did call me a few  days ago and asked for a refill, and I told them that he would have to wait until today's appointment.  However, Dr. Zannie Cove office called in a 30-day refill after we had told the patient that he needed to get refills only from me.  I told him that if he had any more refills from any other provider, I would not refill any more clonazepam.  He expressed understanding.  He stated that he would call the pharmacy as well to get any refills from [name] taken out of their system. -pt is seeing Maggie Font for counseling -told him again to add melatonin at night.  5.  Dysphagia  -had MBE on 11/22/18.  Felt to be primarily esophageal issue.  [facility]  He is going to f/u with GI.  He does think that GERD meds may have helped some.    6. Follow up is anticipated in the next 4-6 months, sooner should new neurologic issues arise.  Much greater than 50% of this visit was spent in counseling and coordinating care.  Total face to face  time:  25 min   Cc:  Mindi Curling, PA-C

## 2019-05-25 ENCOUNTER — Other Ambulatory Visit: Payer: Self-pay

## 2019-05-25 ENCOUNTER — Ambulatory Visit (INDEPENDENT_AMBULATORY_CARE_PROVIDER_SITE_OTHER): Payer: 59 | Admitting: Neurology

## 2019-05-25 ENCOUNTER — Encounter: Payer: Self-pay | Admitting: Neurology

## 2019-05-25 VITALS — BP 140/68 | HR 76 | Wt 193.8 lb

## 2019-05-25 DIAGNOSIS — G20 Parkinson's disease: Secondary | ICD-10-CM | POA: Diagnosis not present

## 2019-05-25 DIAGNOSIS — F419 Anxiety disorder, unspecified: Secondary | ICD-10-CM

## 2019-05-25 MED ORDER — NEUPRO 6 MG/24HR TD PT24: 1.0000 | MEDICATED_PATCH | Freq: Every day | TRANSDERMAL | 5 refills | Status: AC

## 2019-05-25 NOTE — Patient Instructions (Addendum)
Make an appointment with GI.  Your swallow study in 10/2018 showed this:  Pt presents with functional oropharyngeal swallowing.  There was no penetration or aspiration of any consistency trialed.  There was trace to mild oral and pharyngeal residue which spontaneously cleared with subsequent swallows.  Suspect primary esophageal dysphagia.  There was prolonged esophageal stasis of barium tablet after the swallow on esophageal sweep.  Liquid wash was effective to clear tablet from the esophagus, but trasit to stomach was slow with liquid wash.  Recommend follow up with GI to evaluate for esophageal   Increase your neupro 6 mg daily

## 2019-05-26 ENCOUNTER — Ambulatory Visit: Payer: 59 | Admitting: Psychology

## 2019-05-27 ENCOUNTER — Other Ambulatory Visit: Payer: Self-pay | Admitting: Neurology

## 2019-05-29 NOTE — Telephone Encounter (Signed)
Requested Prescriptions   Pending Prescriptions Disp Refills  . Tangent 4 MG/24HR [Pharmacy Med Name: NEUPRO 4 MG/24 HR PATCH] 30 patch 4    Sig: APPLY 1 PATCH ONTO THE SKIN EVERY DAY   Rx last filled:05/25/19 #30 5 REFILLS Pharmacy:  [id number] - , [place]. AT [address]  Pt last seen: 05/25/19  ASSESSMENT/PLAN:  1. Idiopathic Parkinson's disease, diagnosed in 2016 with symptoms to 2015 -He will continue Stalevo 200, 1 tablet 5 times per day -increase neupro, 6 mg daily.  Discussed extensively risk, benefits, and side effects including compulsive behaviors and sleep attacks.  Follow up appt scheduled: 11/02/2019

## 2019-05-29 NOTE — Telephone Encounter (Signed)
RX WAS SENT DURING OFFICE VISIT ON 05/25/19

## 2019-06-09 ENCOUNTER — Ambulatory Visit (INDEPENDENT_AMBULATORY_CARE_PROVIDER_SITE_OTHER): Payer: 59 | Admitting: Psychology

## 2019-06-09 DIAGNOSIS — F4321 Adjustment disorder with depressed mood: Secondary | ICD-10-CM | POA: Diagnosis not present

## 2019-06-26 ENCOUNTER — Telehealth: Payer: Self-pay | Admitting: Neurology

## 2019-06-26 NOTE — Telephone Encounter (Signed)
Please see note, please advise if I need to do anything. thanks

## 2019-06-26 NOTE — Telephone Encounter (Signed)
Left message with the after hour service on 06-25-19 @ 11:34 am   Caller states his meds are not working and his brain function is not working like normal   Caller states that the last 2 days feels like hitting a brick wall and stopping dead in tracks fatigue weakness no energy   Affirmed question weakness of hte face arm hand or leg on one side of body and gradual onset and present now   Care advise given per guideline   See HCP with in 4 hours if office will be closed and no PCP second level triage  You will need to be seen with in the next 3 or 4 hours a near by urgent care is often a good source of care another choice in going to the ED

## 2019-06-26 NOTE — Telephone Encounter (Signed)
No answer at 859

## 2019-06-26 NOTE — Telephone Encounter (Signed)
I contacted patient and he stated he had no energy. I asked him to contact his regular PCP and follow up and he agreed to do so.

## 2019-06-26 NOTE — Telephone Encounter (Signed)
This patient has a lot of anxiety.  When I last saw him (and every time I have seen him), his parkinsons meds have been working okay.  Find out why he thinks that they would have quit working and what his symtpoms are.  If it is loss of energy, that is not related to med since I haven't changed them

## 2019-06-26 NOTE — Telephone Encounter (Signed)
Patient is returning your call he states that he is getting Xrays and to please call him after 10:30

## 2019-06-30 ENCOUNTER — Ambulatory Visit (INDEPENDENT_AMBULATORY_CARE_PROVIDER_SITE_OTHER): Payer: 59 | Admitting: Psychology

## 2019-06-30 DIAGNOSIS — F4321 Adjustment disorder with depressed mood: Secondary | ICD-10-CM

## 2019-07-21 ENCOUNTER — Ambulatory Visit: Payer: 59 | Admitting: Psychology

## 2019-08-04 ENCOUNTER — Telehealth: Payer: Self-pay | Admitting: Neurology

## 2019-08-04 NOTE — Telephone Encounter (Signed)
Form done 

## 2019-08-04 NOTE — Telephone Encounter (Signed)
Spoke with patient he states FMLA that was completed in May was only good for 6 months. It expired in Oct.

## 2019-08-04 NOTE — Telephone Encounter (Signed)
Please call pt.  He dropped off FMLA form.  I last filled his FLMA form in 12/2018.  Those are generally done yearly.  Is there a reason why he needs another one done so soon?

## 2019-08-08 DIAGNOSIS — Z0279 Encounter for issue of other medical certificate: Secondary | ICD-10-CM

## 2019-08-10 ENCOUNTER — Ambulatory Visit: Payer: 59

## 2019-08-10 ENCOUNTER — Ambulatory Visit: Payer: 59 | Admitting: Occupational Therapy

## 2019-08-10 ENCOUNTER — Ambulatory Visit: Payer: 59 | Attending: Physician Assistant | Admitting: Physical Therapy

## 2019-08-10 DIAGNOSIS — R1310 Dysphagia, unspecified: Secondary | ICD-10-CM | POA: Insufficient documentation

## 2019-08-10 DIAGNOSIS — R262 Difficulty in walking, not elsewhere classified: Secondary | ICD-10-CM | POA: Insufficient documentation

## 2019-08-10 DIAGNOSIS — R29818 Other symptoms and signs involving the nervous system: Secondary | ICD-10-CM | POA: Insufficient documentation

## 2019-08-10 DIAGNOSIS — R471 Dysarthria and anarthria: Secondary | ICD-10-CM | POA: Insufficient documentation

## 2019-08-10 NOTE — Therapy (Signed)
[facility] [address] [place], [place], [address] Phone: [phone]   Fax:  [phone]  Patient Details  Name: Ruben Silva MRN: [id number] Date of Birth: 1971/04/21 Referring Provider:  No ref. provider found  Encounter Date: 08/10/2019  Created in error.  [facility] 08/10/2019, 8:37 AM  [facility] [address] [place], [place], [address] Phone: [phone]   Fax:  [phone]

## 2019-08-14 HISTORY — PX: UPPER GI ENDOSCOPY: SHX6162

## 2019-08-22 ENCOUNTER — Ambulatory Visit: Payer: 59 | Admitting: Physical Therapy

## 2019-08-22 ENCOUNTER — Telehealth: Payer: Self-pay

## 2019-08-22 ENCOUNTER — Ambulatory Visit: Payer: 59

## 2019-08-22 ENCOUNTER — Other Ambulatory Visit: Payer: Self-pay

## 2019-08-22 ENCOUNTER — Ambulatory Visit: Payer: 59 | Admitting: Occupational Therapy

## 2019-08-22 DIAGNOSIS — R1319 Other dysphagia: Secondary | ICD-10-CM

## 2019-08-22 DIAGNOSIS — R471 Dysarthria and anarthria: Secondary | ICD-10-CM

## 2019-08-22 DIAGNOSIS — R262 Difficulty in walking, not elsewhere classified: Secondary | ICD-10-CM

## 2019-08-22 DIAGNOSIS — G20 Parkinson's disease: Secondary | ICD-10-CM

## 2019-08-22 DIAGNOSIS — R1310 Dysphagia, unspecified: Secondary | ICD-10-CM

## 2019-08-22 DIAGNOSIS — R29818 Other symptoms and signs involving the nervous system: Secondary | ICD-10-CM

## 2019-08-22 NOTE — Therapy (Signed)
[facility] [address] [place], [place], [address] Phone: [phone]   Fax:  [phone]  Patient Details  Name: Ruben Silva MRN: [id number] Date of Birth: Jan 24, 1971 Referring Provider:  Alonza Bogus, DO  Encounter Date: 08/22/2019   Speech Therapy Parkinson's Disease Screen   Decibel Level today: mid-upper 60sdB  (WNL=70-72 dB) (masked) with sound level meter 30cm away from pt's mouth. Pt's conversational volume has decreased since last treatment course.   "My esophagus had to get stretched last week because my food wasn't feeling like it was passing down my throat real well." Pt has noted a little positive change post procedure, "but it's still kind of happening a little bit." SLP suggested pt might be experiencing dysphagia, as it is a common symptom of Parkinson's disease. SLP to notify pt's DO (Tat) and suggest a follow up MBSS to his March 2020 exam which was WNL.   Pt would benefit from speech-language eval for dysarthria- please order ST eval/treat via Epic. Pt may also benefit from a modified barium swallow eval (MBSS) to follow up from his March 2020 MBSS, as he reports cont'd globus feeling even after esophageal stretching procedure last week.   [facility] ,[place], [place]  08/22/2019, 1:37 PM  [facility] [address] [place], [place], [address] Phone: [phone]   Fax:  [phone]

## 2019-08-22 NOTE — Telephone Encounter (Signed)
Orders entered as requested by ST.  Needs repeat mbs as pt c/o continued dysphagia post esophageal dilation.

## 2019-08-22 NOTE — Therapy (Signed)
[facility] [address] [place], [place], [address] Phone: [phone]   Fax:  [phone]  Patient Details  Name: Ruben Silva MRN: [id number] Date of Birth: 08/06/1971 Referring Provider:  Mindi Curling, PA-C  Encounter Date: 08/22/2019  Occupational Therapy Parkinson's Disease Screen  Hand dominance:  right   Physical Performance Test item #4 (donning/doffing jacket):  16.63 sec  Fastening/unfastening 3 buttons in:   14.53sec  9-hole peg test:    RUE  23.35 sec        LUE  24.81 sec  (drops with both hands)  Box & Blocks Test:   RUE  65 blocks        LUE  55 blocks  Change in ability to perform ADLs/IADLs:  Reports that dressing now takes 1 hour, slowed ADLs, difficulty with writing, is now freezing and moving slowly, difficulty bathing due to knee (recent procedure).  Comments:  Pt appears impulsive with movement with decr control.  Pt would benefit from occupational therapy evaluation due to  Decline in/slowed ADLs.      [facility] 08/22/2019, 1:53 PM  [facility] [address] [place], [place], [address] Phone: [phone]   Fax:  [place], OTR/L [facility] [address] [place], [place]  [address] [phone] phone [phone] 08/22/19 1:53 PM

## 2019-08-22 NOTE — Therapy (Signed)
[facility] [address] [place], [place], [address] Phone: [phone]   Fax:  [phone]  Patient Details  Name: Ruben Silva MRN: [id number] Date of Birth: [birth year] Referring Provider:  Juanda Chance  Encounter Date: 08/22/2019   Physical Therapy Parkinson's Disease Screen   Timed Up and Go test: 10.84 sec  10 meter walk test: 9.97 ft/sec (3.28 ft/sec)  5 time sit to stand test: 6.47 sec  Patient would benefit from Physical Therapy evaluation due to pt's perceived slowing of mobility.  He reports exercise options are limited due to [place]; he would benefit from short course of therapy to update and progress HEP as appropriate.      Anntionette Madkins W. 08/22/2019, 1:28 PM  Frazier Butt., PT   [facility] [address] [place], [place], [address] Phone: [phone]   Fax:  [phone]

## 2019-08-28 ENCOUNTER — Other Ambulatory Visit (HOSPITAL_COMMUNITY): Payer: Self-pay | Admitting: *Deleted

## 2019-08-28 DIAGNOSIS — R1310 Dysphagia, unspecified: Secondary | ICD-10-CM

## 2019-09-04 ENCOUNTER — Telehealth: Payer: Self-pay | Admitting: Neurology

## 2019-09-04 NOTE — Telephone Encounter (Signed)
Patient called to schedule a follow up appointment with Dr. Arbutus Leas after seeing his PCP for a headache behind his right ear that has been ongoing for the last few weeks. He said his PCP said she'd put notes in Epic to for Dr. Arbutus Leas to review.

## 2019-09-04 NOTE — Telephone Encounter (Signed)
Pt.notified

## 2019-09-04 NOTE — Telephone Encounter (Signed)
I only treat him for PD.  Unfortunately, I don't treat headaches and PD doesn't cause headaches.  If doesn't resolve, his PCP can refer to the headache center if they would feel most appropriate

## 2019-09-05 ENCOUNTER — Telehealth: Payer: Self-pay | Admitting: Neurology

## 2019-09-05 NOTE — Telephone Encounter (Signed)
Patient needs to talk to someone about a spit test that he is to have done he needs to know when and where

## 2019-09-05 NOTE — Telephone Encounter (Signed)
Looks like pt has a MBE scheduled at [facility] on 1/7 at radiology.  Please give him their number for further information.  Looks like test is at [facility]

## 2019-09-06 NOTE — Telephone Encounter (Signed)
Pt is aware and we discussed again his headaches. Advised to reach out to PCP because Tat only treats movement disorders. Patient verbalized understanding

## 2019-09-07 ENCOUNTER — Ambulatory Visit (HOSPITAL_COMMUNITY)
Admission: RE | Admit: 2019-09-07 | Discharge: 2019-09-07 | Disposition: A | Discharge status: Home / Self Care | Payer: 59 | Source: Ambulatory Visit | Attending: Neurology | Admitting: Neurology

## 2019-09-07 ENCOUNTER — Other Ambulatory Visit: Payer: Self-pay

## 2019-09-07 DIAGNOSIS — G20 Parkinson's disease: Secondary | ICD-10-CM

## 2019-09-07 DIAGNOSIS — R1310 Dysphagia, unspecified: Secondary | ICD-10-CM | POA: Insufficient documentation

## 2019-09-07 DIAGNOSIS — R1319 Other dysphagia: Secondary | ICD-10-CM

## 2019-09-07 NOTE — Progress Notes (Signed)
Objective Swallowing Evaluation: Type of Study: MBS-Modified Barium Swallow Study   Patient Details  Name: Ruben Silva MRN: [id number] Date of Birth: [birth year]  Today's Date: 09/07/2019 Time: SLP Start Time (ACUTE ONLY): 1110 -SLP Stop Time (ACUTE ONLY): 1136  SLP Time Calculation (min) (ACUTE ONLY): 26 min   Past Medical History:  Past Medical History:  Diagnosis Date  . Bell's palsy   . Constipation   . Erectile dysfunction   . History of stomach ulcers   . Hypertension   . Insomnia   . Knee pain   . Murmur   . Parkinson disease (HCC)   . Parkinsons (HCC)   . Weakness generalized    Past Surgical History:  Past Surgical History:  Procedure Laterality Date  . NO PAST SURGERIES     HPI: Ruben Silva, 48y/m, presented to [facility] Radiology Department for Out patient Modified Barium Swallow to assess his swallow function. PMH o fParkinson's disease, generalized weakness, costipation, h/o stomach ulcers, hypertension, insomnia, h/o esophageal stricture last stretched 08/2019.    Subjective: alert and cooperative    Assessment / Plan / Recommendation  CHL IP CLINICAL IMPRESSIONS 09/07/2019  Clinical Impression Out patient MBS completed and compared to last MBS 11/25/18. Pt reports difficulty swallowing solid foods such as grilled chicken even since esophageal dilitation.  Pt continues to present with functional oropharyngeal swallowing. Trace oral residue noted across consistenices. Swallow was triggered at the valleculae with single thin liquids and at the pyriform sinuses with sequential swallows from straw, solids swallow trigger at base of tongue. Purees, solids, pill and mixed textures (peaches in liquid) all found to clear esophagus completely and without delay upon esophageal sweep. Video imaging shared with patient to educate patient of normal swallowing function. Recommend pt consisder moistening meats with a gravy or sauce to aid in esophageal clearance  as well as alternate liquids and solids. Patient prefers to take his pills one at a time with thin liquids. He reports no difficulty with medications at this time.   SLP Visit Diagnosis Dysphagia, unspecified (R13.10)  Attention and concentration deficit following --  Frontal lobe and executive function deficit following --  Impact on safety and function Mild aspiration risk      CHL IP TREATMENT RECOMMENDATION 09/07/2019  Treatment Recommendations No treatment recommended at this time     Prognosis 09/07/2019  Prognosis for Safe Diet Advancement Good  Barriers to Reach Goals --  Barriers/Prognosis Comment --    CHL IP DIET RECOMMENDATION 09/07/2019  SLP Diet Recommendations Regular solids;Thin liquid  Liquid Administration via Cup;Straw  Medication Administration Whole meds with liquid  Compensations --  Postural Changes Seated upright at 90 degrees      CHL IP OTHER RECOMMENDATIONS 11/15/2018  Recommended Consults Consider GI evaluation;Consider esophageal assessment  Oral Care Recommendations Oral care BID  Other Recommendations --      CHL IP FOLLOW UP RECOMMENDATIONS 09/07/2019  Follow up Recommendations None      No flowsheet data found.         CHL IP ORAL PHASE 09/07/2019  Oral Phase WFL  Oral - Pudding Teaspoon --  Oral - Pudding Cup --  Oral - Honey Teaspoon --  Oral - Honey Cup --  Oral - Nectar Teaspoon --  Oral - Nectar Cup --  Oral - Nectar Straw --  Oral - Thin Teaspoon --  Oral - Thin Cup --  Oral - Thin Straw --  Oral - Puree --  Oral -  Mech Soft --  Oral - Regular --  Oral - Multi-Consistency --  Oral - Pill --  Oral Phase - Comment --    CHL IP PHARYNGEAL PHASE 09/07/2019  Pharyngeal Phase WFL  Pharyngeal- Pudding Teaspoon --  Pharyngeal --  Pharyngeal- Pudding Cup --  Pharyngeal --  Pharyngeal- Honey Teaspoon --  Pharyngeal --  Pharyngeal- Honey Cup --  Pharyngeal --  Pharyngeal- Nectar Teaspoon --  Pharyngeal --  Pharyngeal- Nectar Cup --   Pharyngeal --  Pharyngeal- Nectar Straw --  Pharyngeal --  Pharyngeal- Thin Teaspoon --  Pharyngeal --  Pharyngeal- Thin Cup --  Pharyngeal --  Pharyngeal- Thin Straw --  Pharyngeal --  Pharyngeal- Puree --  Pharyngeal --  Pharyngeal- Mechanical Soft --  Pharyngeal --  Pharyngeal- Regular --  Pharyngeal --  Pharyngeal- Multi-consistency --  Pharyngeal --  Pharyngeal- Pill --  Pharyngeal --  Pharyngeal Comment --     CHL IP CERVICAL ESOPHAGEAL PHASE 09/07/2019  Cervical Esophageal Phase WFL  Pudding Teaspoon --  Pudding Cup --  Honey Teaspoon --  Honey Cup --  Nectar Teaspoon --  Nectar Cup --  Nectar Straw --  Thin Teaspoon --  Thin Cup --  Thin Straw --  Puree --  Mechanical Soft --  Regular --  Multi-consistency --  Pill --  Cervical Esophageal Comment --     Wynelle Bourgeois., MA, CCC-SLP 09/07/2019, 12:14 PM

## 2019-09-11 ENCOUNTER — Ambulatory Visit: Payer: 59 | Admitting: Physical Therapy

## 2019-09-12 ENCOUNTER — Ambulatory Visit: Payer: 59 | Admitting: Occupational Therapy

## 2019-09-12 ENCOUNTER — Ambulatory Visit: Payer: 59 | Admitting: Speech Pathology

## 2019-09-13 ENCOUNTER — Other Ambulatory Visit: Payer: Self-pay

## 2019-09-13 ENCOUNTER — Encounter: Payer: Self-pay | Admitting: Physical Therapy

## 2019-09-13 ENCOUNTER — Ambulatory Visit: Payer: 59 | Attending: Physician Assistant | Admitting: Physical Therapy

## 2019-09-13 ENCOUNTER — Telehealth: Payer: Self-pay | Admitting: Neurology

## 2019-09-13 DIAGNOSIS — R29818 Other symptoms and signs involving the nervous system: Secondary | ICD-10-CM

## 2019-09-13 DIAGNOSIS — R293 Abnormal posture: Secondary | ICD-10-CM

## 2019-09-13 DIAGNOSIS — R2689 Other abnormalities of gait and mobility: Secondary | ICD-10-CM | POA: Diagnosis not present

## 2019-09-13 DIAGNOSIS — R41840 Attention and concentration deficit: Secondary | ICD-10-CM | POA: Diagnosis present

## 2019-09-13 DIAGNOSIS — R41844 Frontal lobe and executive function deficit: Secondary | ICD-10-CM | POA: Insufficient documentation

## 2019-09-13 DIAGNOSIS — R41841 Cognitive communication deficit: Secondary | ICD-10-CM | POA: Insufficient documentation

## 2019-09-13 DIAGNOSIS — R29898 Other symptoms and signs involving the musculoskeletal system: Secondary | ICD-10-CM | POA: Diagnosis present

## 2019-09-13 DIAGNOSIS — R471 Dysarthria and anarthria: Secondary | ICD-10-CM | POA: Diagnosis present

## 2019-09-13 DIAGNOSIS — R278 Other lack of coordination: Secondary | ICD-10-CM | POA: Insufficient documentation

## 2019-09-13 DIAGNOSIS — R1312 Dysphagia, oropharyngeal phase: Secondary | ICD-10-CM | POA: Diagnosis present

## 2019-09-13 NOTE — Therapy (Signed)
[place] [address] [place], [place], [address] Phone: [phone]   Fax:  [phone]  Physical Therapy Evaluation  Patient Details  Name: Ruben Silva MRN: [id number] Date of Birth: 02/28/1971 Referring Provider (PT): Tat, Wells Guiles   Encounter Date: 09/13/2019  PT End of Session - 09/13/19 2019    Visit Number  1    Number of Visits  18    Authorization Type  [employer] 24 PT, 24 OT, 24 ST    Authorization - Visit Number  1    Authorization - Number of Visits  24    PT Start Time  9678    PT Stop Time  1554    PT Time Calculation (min)  50 min    Activity Tolerance  Patient tolerated treatment well;Patient limited by pain   knee pain, back pain   Behavior During Therapy  [facility] for tasks assessed/performed       Past Medical History:  Diagnosis Date  . Bell's palsy   . Constipation   . Erectile dysfunction   . History of stomach ulcers   . Hypertension   . Insomnia   . Knee pain   . Murmur   . Parkinson disease ([place])   . Parkinsons ([place])   . Weakness generalized     Past Surgical History:  Procedure Laterality Date  . NO PAST SURGERIES      There were no vitals filed for this visit.   Subjective Assessment - 09/13/19 1711    Subjective  I don't know what's going on; have some trouble with my knee/back, my elbow (they thought it might be broken, but I may have torn a tendon); my eye-I'm seeing colors.  I think I might need to see my neurologist soon.  No falls.  I feel like my disease is progressing.  My neck and posture is bothering me.    Diagnostic tests  MRI from 2019 shows L3-4, L4-5 disc bulge, no nerve involvement    Patient Stated Goals  Pt's goal for physical therapy:  to get back to where I was, to avoid the Parkinson's from advancing.    Currently in Pain?  Yes    Pain Score  8     Pain Location  Back    Pain Orientation  Mid;Lower;Left    Pain Descriptors / Indicators  Stabbing     Pain Type  Acute pain    Pain Onset  1 to 4 weeks ago    Pain Frequency  Intermittent    Aggravating Factors   Lying on stomach    Pain Relieving Factors  change positions    Multiple Pain Sites  Yes    Pain Score  8    Pain Location  Knee    Pain Orientation  Right    Pain Descriptors / Indicators  Sharp;Aching    Pain Type  Chronic pain    Pain Onset  More than a month ago    Pain Frequency  Constant    Aggravating Factors   Standing on it    Pain Relieving Factors  ice, kinesiotape helps    Pain Score  8    Pain Location  Neck    Pain Orientation  Right;Mid    Pain Descriptors / Indicators  Headache;Tightness;Aching    Pain Type  Acute pain    Pain Onset  1 to 4 weeks ago    Pain Frequency  Intermittent    Aggravating Factors  unsure    Pain Relieving Factors  muscle relaxer         [facility] PT Assessment - 09/13/19 1723      Assessment   Medical Diagnosis  Parkinson's disease    Referring Provider (PT)  Tat, Lurena Joiner    Onset Date/Surgical Date  08/22/19   PD screen   Hand Dominance  Right      Precautions   Precautions  Fall      Balance Screen   Has the patient fallen in the past 6 months  No    Has the patient had a decrease in activity level because of a fear of falling?   No    Is the patient reluctant to leave their home because of a fear of falling?   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    Leisure  Enjoys [employer] (has not been able to do since COVID)      Posture/Postural Control   Posture/Postural Control  Postural limitations    Postural Limitations  Rounded Shoulders;Forward head;Posterior pelvic tilt;Flexed trunk      ROM / Strength   AROM / PROM / Strength  Strength      Strength   Overall Strength  Deficits    Overall Strength Comments  Noted in functional strength measure, 5x sit<>stand      Transfers   Transfers  Sit to Stand;Stand to Sit    Sit to Stand  6: Modified independent (Device/Increase time);From chair/3-in-1;Without upper extremity assist    Five time sit to stand comments   11.28    Stand to Sit  6: Modified independent (Device/Increase time);Without upper extremity assist;To chair/3-in-1      Ambulation/Gait   Ambulation/Gait  Yes    Ambulation/Gait Assistance  5: Supervision;6: Modified independent (Device/Increase time)    Ambulation/Gait Assistance Details  Pt with forward flexed posture, decreased arm swing.  Initially, upon coming into gym, pt demo shuffling steps with gait.  With TUG tests and initiation of 3 MWT, pt ambulates with marching-type step with discoordinated arm swing.    Ambulation Distance (Feet)  335 Feet   in 3 MWT   Assistive device  None    Gait Pattern  Step-through pattern;Decreased arm swing - right;Decreased arm swing - left;Decreased step length - left;Decreased step length - right;Decreased dorsiflexion - right;Decreased dorsiflexion - left;Shuffle;Decreased trunk rotation;Trunk flexed;Narrow base of support    Gait velocity  14 sec= 2.34 ft/sec    Pre-Gait Activities  Improved foot clearance with cues for heelstrike      Standardized Balance Assessment   Standardized Balance Assessment  Timed Up and Go Test      Timed Up and Go Test   TUG  Normal TUG;Manual TUG;Cognitive TUG  Normal TUG (seconds)  12    Manual TUG (seconds)  14.63    Cognitive TUG (seconds)  12.81    TUG Comments  Scores >13.5-15 seconds indicate increased fall  risk; >10% difference indicates difficulty with dual tasking                Objective measurements completed on examination: See above findings.          Initiated HEP with the following:  Instructed pt to perform several times during the day, but NOT to overdo it with exercises. Seated anterior/posterior pelvic tilts x 5 reps Seated shoulder circles x 5 reps Seated neck retraction, 2 sets x 5 reps  Also performed seated scapular retraction x 5 reps.  Educated pt in sitting posture at work station-sitting with pillow or towel roll behind back to avoid posterior pelvic tilt/rounded posture.      PT Education - 09/13/19 2017    Education Details  PT eval results, POC, benefits of PWR! Moves and large amplitude movement patterns in HEP; initiated HEP for posture    Person(s) Educated  Patient    Methods  Explanation;Demonstration;Handout;Verbal cues    Comprehension  Verbalized understanding;Returned demonstration;Verbal cues required;Need further instruction       PT Short Term Goals - 09/13/19 2027      PT SHORT TERM GOAL #1   Title  Patient will be independent with HEP to address strength, balance, posture.  TARGET 5 weeks:  10/13/2019    Time  5    Period  Weeks    Status  New    Target Date  10/13/19      PT SHORT TERM GOAL #2   Title  Pt will improve 5x sit<>stand to less than or equal to 10 seconds for improved functional lower extremity strength.    Time  5    Period  Weeks    Status  New    Target Date  10/13/19      PT SHORT TERM GOAL #3   Title  Pt will verbalize at least 3 means to decrease low back pain, for overall improved funcitonal mobility.    Time  5    Period  Weeks    Status  New    Target Date  10/13/19      PT SHORT TERM GOAL #4   Title  Pt will improve TUG/TUG manual score to less than or equal to 10% difference for improved dual tasking with gait.    Time  5    Period  Weeks    Status  New    Target Date  10/13/19        PT  Long Term Goals - 09/13/19 2033      PT LONG TERM GOAL #1   Title  Patient will be independent with updated HEP and able to verbalize a plan for appropriate community-based exercise upon discharge from PT. TARGET 11/10/2019    Time  9    Period  Weeks    Status  New      PT LONG TERM GOAL #2   Title  Pt will improve gait velocity to at least 2.62 ft/sec for improved gait efficiency and safety.    Time  5    Period  Weeks      PT LONG TERM GOAL #3   Title  Pt will improve to at least 385 ft for improved gait efficiency and safety.    Time  5  Period  Weeks    Status  New      PT LONG TERM GOAL #4   Title  Pt will verbalize understanding of posture and body mechanics for decreased back/neck pain, improved participation with functional/work activities.    Time  8    Period  Weeks    Status  New             Plan - 09/13/19 2020    Clinical Impression Statement  Pt is a 49 year old male who presents to OPPT with history of Parkinson's disease, referred for return PT eval following PD screen in December 2020.  Pt presents with slowed mobility compared to previous bout of therapy, with decreased gait velocity of 2.34 ft/sec (indicating limited community ambulator), decreased functional strength, decreased ability for dual tasking, postural abdnomality, stiffness/rigidity noted through trunk and LUE with gait.  Pt has been limited with community exercise and reports slowed ADLs.  He would benefit from skilled PT to address the above stated deficits to improve overall functional mobility.    Personal Factors and Comorbidities  Comorbidity 3+    Comorbidities  PMH:  Bell's palsy, HTN, knee pain, PD, esophageal stricture (stretched 08/2019), R chondromalacia patella, R knee OA, GERD    Examination-Activity Limitations  Locomotion Level;Transfers    Examination-Participation Restrictions  Community Activity;Other   Exercise   Stability/Clinical Decision Making  Stable/Uncomplicated     Clinical Decision Making  Low    Rehab Potential  Good    PT Frequency  Other (comment)   1x/wk (eval), then 2x/wk for 8 weeks   PT Duration  Other (comment)   Plus eval visit; total POC = 9 weeks   PT Treatment/Interventions  ADLs/Self Care Home Management;Neuromuscular re-education;Balance training;Therapeutic exercise;Therapeutic activities;Functional mobility training;Gait training;Patient/family education    PT Next Visit Plan  REview initial HEP exercises and add to HEP:  PWR! Moves(make sure no restrictions for knee/elbow); gait with reciprocal arm swing, intensity; postural strengthening/stretching, and postural education    Consulted and Agree with Plan of Care  Patient       Patient will benefit from skilled therapeutic intervention in order to improve the following deficits and impairments:  Abnormal gait, Decreased coordination, Difficulty walking, Pain, Decreased balance, Improper body mechanics, Postural dysfunction, Decreased strength, Decreased mobility  Visit Diagnosis: Other abnormalities of gait and mobility  Other symptoms and signs involving the nervous system  Other symptoms and signs involving the musculoskeletal system  Abnormal posture     Problem List Patient Active Problem List   Diagnosis Date Noted  . Acquired hammer toe of left foot 03/10/2019  . Right lumbar radiculopathy 12/27/2017  . Gastroparesis 07/06/2017  . Orthostatic dizziness 05/12/2016  . Low back pain 05/12/2016  . Chronic constipation 12/17/2015  . Chronic insomnia 12/17/2015  . Left hip pain 12/16/2015  . Parkinson's disease (HCC) 09/11/2015  . HYPERTENSION 11/21/2008  . MURMUR 11/21/2008  . CHEST PAIN, ATYPICAL 11/21/2008    Arrie Borrelli W. 09/13/2019, 8:37 PM Gean Maidens., PT  [facility] [address] [place], [place], [address] Phone: [phone]   Fax:  [phone]  Name: Koren Sermersheim MRN:  [id number] Date of Birth: 1970/12/12

## 2019-09-13 NOTE — Telephone Encounter (Signed)
Pt called stating that provider referred him to Dr. Arbutus Leas but he is never able to get in to see her and he is wanting to know if his referral can be sent to another doctor. Please advise.

## 2019-09-13 NOTE — Patient Instructions (Signed)
Access Code: [email or web address]  URL: [email or web address]  Date: 09/13/2019  Prepared by: Lonia Blood   Exercises Seated Shoulder Rolls - 10 reps - 1-2 sets - 1-2x daily - 5x weekly Seated Cervical Retraction - 10 reps - 1-2 sets - 1-2x daily - 5x weekly Seated Pelvic Tilt - 10 reps - 1-2 sets - 1-2x daily - 5x weekly Patient Education Office Posture

## 2019-09-13 NOTE — Telephone Encounter (Signed)
Spoke to patient he will call his PCP to get referral . Thanks Annabelle Harman

## 2019-09-15 ENCOUNTER — Encounter: Payer: Self-pay | Admitting: Occupational Therapy

## 2019-09-15 ENCOUNTER — Other Ambulatory Visit: Payer: Self-pay

## 2019-09-15 ENCOUNTER — Ambulatory Visit: Payer: 59 | Admitting: Occupational Therapy

## 2019-09-15 ENCOUNTER — Ambulatory Visit: Payer: 59 | Admitting: Physical Therapy

## 2019-09-15 ENCOUNTER — Encounter: Payer: Self-pay | Admitting: Physical Therapy

## 2019-09-15 ENCOUNTER — Encounter: Payer: 59 | Admitting: Occupational Therapy

## 2019-09-15 DIAGNOSIS — R41840 Attention and concentration deficit: Secondary | ICD-10-CM

## 2019-09-15 DIAGNOSIS — R41844 Frontal lobe and executive function deficit: Secondary | ICD-10-CM

## 2019-09-15 DIAGNOSIS — R29898 Other symptoms and signs involving the musculoskeletal system: Secondary | ICD-10-CM

## 2019-09-15 DIAGNOSIS — R293 Abnormal posture: Secondary | ICD-10-CM

## 2019-09-15 DIAGNOSIS — R29818 Other symptoms and signs involving the nervous system: Secondary | ICD-10-CM

## 2019-09-15 DIAGNOSIS — R2689 Other abnormalities of gait and mobility: Secondary | ICD-10-CM

## 2019-09-15 DIAGNOSIS — R278 Other lack of coordination: Secondary | ICD-10-CM

## 2019-09-15 NOTE — Therapy (Signed)
[facility] [address] [place], [place], [address] Phone: [phone]   Fax:  [phone]  Physical Therapy Treatment  Patient Details  Name: Ruben Silva MRN: [id number] Date of Birth: November 08, 1970 Referring Provider (PT): Tat, Lurena Joiner   Encounter Date: 09/15/2019  PT End of Session - 09/15/19 1751    Visit Number  2    Number of Visits  18    Authorization Type  U[employer]-VL 24 PT, 24 OT, 24 ST    Authorization - Visit Number  2    Authorization - Number of Visits  24    PT Start Time  0933    PT Stop Time  1016    PT Time Calculation (min)  43 min    Activity Tolerance  Patient tolerated treatment well;No increased pain   Pt rates pain as 0/10 by end of session   Behavior During Therapy  [facility] for tasks assessed/performed       Past Medical History:  Diagnosis Date  . Bell's palsy   . Constipation   . Erectile dysfunction   . History of stomach ulcers   . Hypertension   . Insomnia   . Knee pain   . Murmur   . Parkinson disease (HCC)   . Parkinsons (HCC)   . Weakness generalized     Past Surgical History:  Procedure Laterality Date  . NO PAST SURGERIES      There were no vitals filed for this visit.  Subjective Assessment - 09/15/19 0936    Subjective  Been trying the exercises and been trying to be more mindful of my posture.    Diagnostic tests  MRI from 2019 shows L3-4, L4-5 disc bulge, no nerve involvement    Patient Stated Goals  Pt's goal for physical therapy:  to get back to where I was, to avoid the Parkinson's from advancing.    Currently in Pain?  Yes    Pain Score  7     Pain Location  Back    Pain Orientation  Mid;Lower    Pain Descriptors / Indicators  Aching    Pain Type  Chronic pain    Pain Onset  1 to 4 weeks ago    Pain Frequency  Intermittent    Aggravating Factors   walking    Pain Relieving Factors  Tylenol Arthritis    Pain Score  7    Pain Location  Knee    Pain  Orientation  Right    Pain Descriptors / Indicators  Aching;Sharp    Pain Type  Chronic pain    Pain Onset  More than a month ago    Pain Frequency  Constant    Aggravating Factors   standing on it    Pain Relieving Factors  ice, kinesiotape helps    Pain Score  3    Pain Location  Neck    Pain Orientation  Right;Mid    Pain Descriptors / Indicators  Aching;Tightness    Pain Type  Acute pain    Pain Onset  1 to 4 weeks ago    Pain Frequency  Intermittent    Aggravating Factors   unsure    Pain Relieving Factors  stretches, exercises                       [facility] Adult PT Treatment/Exercise - 09/15/19 1739      Ambulation/Gait   Ambulation/Gait  Yes  Ambulation/Gait Assistance  5: Supervision;6: Modified independent (Device/Increase time)    Ambulation/Gait Assistance Details  Cues for increased step length and heelstrike; cues for arm swing.  Pt reverts to marching-type gait pattern, with cues to extend knees and for heelstrike.    Ambulation Distance (Feet)  230 Feet   x 3   Assistive device  None    Gait Pattern  Step-through pattern;Decreased arm swing - right;Decreased arm swing - left;Decreased step length - left;Decreased step length - right;Decreased dorsiflexion - right;Decreased dorsiflexion - left;Shuffle;Decreased trunk rotation;Trunk flexed;Narrow base of support    Ambulation Surface  Level;Indoor      Exercises   Exercises  Knee/Hip      Knee/Hip Exercises: Aerobic   Stepper  SciFit, Seated Stepper, Level 2.5-3, 4 extremities, x 5 minutes; cues to keep RPM >80-90 (at times, pt able to go >100)      Reviewed exercises from HEP given last visit:  Seated anterior/posterior pelvic tilts x 5 reps, seated chin tuck x 5 reps, seated shoulder rolls x 5 reps forward/back.  Pt return demo understanding, with cues given for finding neutral posture with pelvic tilts.  [facility]) - 09/15/19 4132    PWR! exercises  Moves in sitting;Moves in standing    PWR! Up   x 10 reps    PWR! Rock  x 10 reps forward and back    Comments  Modified standing PWR! Moves performed at counter for support    PWR! Up  x 10 reps    PWR! Rock  x 10 reps    PWR! Twist  x 5 reps each side    PWR! Step  x 5 reps single leg step out and in; double leg step out and in x 5 reps    Comments  Initial 3 reps with slowed pace to prepare, stretch, then additional reps as above with increased intensity and amplitude.  PT provides verbal and visual cues for PWR! Moves in sitting.       Self Care:  Provided handout (from [employer] that did not print out last visit) on postural education for workspace.  Discussed importance of focus on Parkinson's specific exercises and avoiding overdoing other types of body weight supported exercise at this time that might cause injury.   PT Education - 09/15/19 1748    Education Details  Added PWR! Moves in sitting to HEP; instructed patient to start over with therapy-based exercises, avoiding push-ups, pull-ups at this time due to pt's previously noted pain in elbow    Person(s) Educated  Patient    Methods  Explanation;Demonstration;Handout;Verbal cues    Comprehension  Verbalized understanding;Returned demonstration;Need further instruction       PT Short Term Goals - 09/13/19 2027      PT SHORT TERM GOAL #1   Title  Patient will be independent with HEP to address strength, balance, posture.  TARGET 5 weeks:  10/13/2019    Time  5    Period  Weeks    Status  New    Target Date  10/13/19      PT SHORT TERM GOAL #2   Title  Pt will improve 5x sit<>stand to less than or equal to 10 seconds for improved functional lower extremity strength.    Time  5    Period  Weeks    Status  New    Target Date  10/13/19      PT SHORT TERM GOAL #3   Title  Pt will verbalize  at least 3 means to decrease low back pain, for overall improved funcitonal mobility.    Time  5    Period  Weeks    Status  New    Target Date  10/13/19      PT SHORT TERM  GOAL #4   Title  Pt will improve TUG/TUG manual score to less than or equal to 10% difference for improved dual tasking with gait.    Time  5    Period  Weeks    Status  New    Target Date  10/13/19        PT Long Term Goals - 09/13/19 2033      PT LONG TERM GOAL #1   Title  Patient will be independent with updated HEP and able to verbalize a plan for appropriate community-based exercise upon discharge from PT. TARGET 11/10/2019    Time  9    Period  Weeks    Status  New      PT LONG TERM GOAL #2   Title  Pt will improve gait velocity to at least 2.62 ft/sec for improved gait efficiency and safety.    Time  5    Period  Weeks      PT LONG TERM GOAL #3   Title  Pt will improve to at least 385 ft for improved gait efficiency and safety.    Time  5    Period  Weeks    Status  New      PT LONG TERM GOAL #4   Title  Pt will verbalize understanding of posture and body mechanics for decreased back/neck pain, improved participation with functional/work activities.    Time  8    Period  Weeks    Status  New            Plan - 09/15/19 1752    Clinical Impression Statement  Reviewed postural exercises from last visit, with pt return demo understanding.  Pt seems motivated for movement activities, and therapist provides cues for correct technique throughout PT session.  Pt rates pain in knees, back, neck as 0/10 by end of session.  Pt will continue to benefit from reinforcement of PWR! Moves techniques to improve overall functional mobility, efficiency of movement, and decreased pain.    Personal Factors and Comorbidities  Comorbidity 3+    Comorbidities  PMH:  Bell's palsy, HTN, knee pain, PD, esophageal stricture (stretched 08/2019), R chondromalacia patella, R knee OA, GERD    Examination-Activity Limitations  Locomotion Level;Transfers    Examination-Participation Restrictions  Community Activity;Other   Exercise   Stability/Clinical Decision Making  Stable/Uncomplicated     Rehab Potential  Good    PT Frequency  Other (comment)   1x/wk (eval), then 2x/wk for 8 weeks   PT Duration  Other (comment)   Plus eval visit; total POC = 9 weeks   PT Treatment/Interventions  ADLs/Self Care Home Management;Neuromuscular re-education;Balance training;Therapeutic exercise;Therapeutic activities;Functional mobility training;Gait training;Patient/family education    PT Next Visit Plan  Review HEP; continue to work on aerobic activity (SciFit/NuStep), gait with reciprocal arm swing/intensity/resisted gait, postural strengthening and stretching; PWR! Moves in standing    Consulted and Agree with Plan of Care  Patient       Patient will benefit from skilled therapeutic intervention in order to improve the following deficits and impairments:  Abnormal gait, Decreased coordination, Difficulty walking, Pain, Decreased balance, Improper body mechanics, Postural dysfunction, Decreased strength, Decreased mobility  Visit Diagnosis:  Other abnormalities of gait and mobility  Other symptoms and signs involving the nervous system  Abnormal posture     Problem List Patient Active Problem List   Diagnosis Date Noted  . Acquired hammer toe of left foot 03/10/2019  . Right lumbar radiculopathy 12/27/2017  . Gastroparesis 07/06/2017  . Orthostatic dizziness 05/12/2016  . Low back pain 05/12/2016  . Chronic constipation 12/17/2015  . Chronic insomnia 12/17/2015  . Left hip pain 12/16/2015  . Parkinson's disease ([place]) 09/11/2015  . HYPERTENSION 11/21/2008  . MURMUR 11/21/2008  . CHEST PAIN, ATYPICAL 11/21/2008    Ruben Enrico W. 09/15/2019, 5:57 PM Frazier Butt., PT  [facility] [address] [place], [place], [address] Phone: [phone]   Fax:  [phone]  Name: Ruben Silva MRN: [id number] Date of Birth: May 02, 1971

## 2019-09-15 NOTE — Therapy (Signed)
[facility] [address] [place], [place], [address] Phone: [phone]   Fax:  [phone]  Occupational Therapy Evaluation  Patient Details  Name: Ruben Silva MRN: [id number] Date of Birth: 1971/01/17 Referring Provider (OT): Dr. Arbutus Leas   Encounter Date: 09/15/2019  OT End of Session - 09/15/19 1125    Visit Number  1    Number of Visits  16    Date for OT Re-Evaluation  11/14/19    Authorization Type  UHC -24 visits OT    Authorization - Visit Number  1    Authorization - Number of Visits  24    OT Start Time  0900    OT Stop Time  0930    OT Time Calculation (min)  30 min    Activity Tolerance  Patient tolerated treatment well    Behavior During Therapy  [facility] for tasks assessed/performed       Past Medical History:  Diagnosis Date  . Bell's palsy   . Constipation   . Erectile dysfunction   . History of stomach ulcers   . Hypertension   . Insomnia   . Knee pain   . Murmur   . Parkinson disease (HCC)   . Parkinsons (HCC)   . Weakness generalized     Past Surgical History:  Procedure Laterality Date  . NO PAST SURGERIES      There were no vitals filed for this visit.  Subjective Assessment - 09/15/19 0902    Subjective   Pt reports overall decline in ADLS and function due to PD    Pertinent History  PD since 2016, HTN, heart murmur, MRI from 2019 shows L3-4, L4-5 disc bulge, no nerve involvement, pt reports injury to right elbow(pt states torn ligament)    Patient Stated Goals  improve speed with ADLs    Currently in Pain?  Yes    Pain Score  7     Pain Location  Back    Pain Descriptors / Indicators  Aching    Pain Type  Chronic pain    Pain Onset  More than a month ago    Pain Frequency  Intermittent    Aggravating Factors   malpositioning    Pain Relieving Factors  stretching    Pain Onset  More than a month ago    Pain Onset  1 to 4 weeks ago        [facility] OT Assessment -  09/15/19 0918      Assessment   Medical Diagnosis  Parkinson's disease    Referring Provider (OT)  Dr. 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      IADL   Shopping  Needs to be accompanied on any shopping trip    Light Housekeeping  Maintains house alone or with occasional assistance    Meal Prep  --   microwave cooking   Medication Management  Is responsible for taking medication in correct dosages at correct time    Financial Management  Manages financial matters independently (budgets, writes checks, pays rent, bills goes to bank), collects and keeps track of income      Mobility   Mobility Status  Independent      Written Expression   Dominant Hand  Right    Handwriting  100% legible;Increased time      Vision - History   Visual History  Other (comment)    Additional Comments  Pt reports new bright lights in left eye   sse opthamologist next week     Vision  Assessment   Vision Assessment  Vision not tested      Cognition   Overall Cognitive Status  Impaired/Different from baseline    Attention  Selective    Selective Attention  Impaired    Memory  Impaired    Memory Impairment  Decreased short term memory    Behaviors  Impulsive    Cognition Comments  Pt demonstrates slower processing    Pt reports making errors at work     Observation/Other Assessments   Other Surveys   Select    Physical Performance Test    Yes    Simulated Eating Comments  11.34    Donning Doffing Jacket Time (seconds)  9.47    Donning Doffing Jacket Comments  3 button/ unbutton 48 secs      Posture/Postural Control   Posture/Postural Control  Postural limitations    Postural Limitations  Rounded Shoulders;Forward head;Posterior pelvic tilt;Flexed trunk      Coordination   Gross Motor Movements are Fluid and Coordinated  No    Fine Motor Movements are Fluid and Coordinated  No    9 Hole Peg Test  Right;Left    Right 9 Hole Peg Test  20.96    Left 9 Hole Peg Test  33.28    Coordination  impaired for LUE      Tone   Assessment Location  Right Upper Extremity;Left Upper Extremity      ROM / Strength   AROM / PROM / Strength  AROM      AROM   Overall AROM   Deficits    Overall AROM Comments  RUE shoulder flexion 130, elbow extension -20, pt reports injuring elbow. LUE shoulder flexion 130, elbow extension -5      RUE Tone   RUE Tone  --   mild rigidity     LUE Tone   LUE Tone  --   mild rigidity                       OT Short Term Goals - 09/15/19 1153  OT SHORT TERM GOAL #1   Title  I with PD specific HEP    Time  4    Period  Weeks    Status  New    Target Date  10/15/19      OT SHORT TERM GOAL #2   Title  I with adapted strategies for ADLS/ IADLS in order to maximize safety and independence    Time  4    Period  Weeks    Status  New      OT SHORT TERM GOAL #3   Title  Pt will demonstrate improved LUE fine motor  coordination for ADLs as evidenced by decreasing 9 hole peg test score to 30 secs or less.    Baseline  RUE 20.96 LUE 33.28,    Time  4    Period  Weeks    Status  New      OT SHORT TERM GOAL #4   Title  Pt will verbalize understanding of compensatory strategies for memory/ cognitive deficits and ways to keep thinking skills sharp.    Time  4    Period  Weeks    Status  New        OT Long Term Goals - 09/15/19 1156      OT LONG TERM GOAL #1   Title  Pt will verbalize understanding of ways to prevent future PD related complications.-11/14/19    Time  8    Period  Weeks    Status  New    Target Date  11/14/19      OT LONG TERM GOAL #2   Title  Pt will demonstrate increased fine motor coordination for ADLs as evidenced by decreasing 3 button/ unbutton test to 43 secs or less.    Baseline  48 secs    Time  8    Period  Weeks    Status  New      OT LONG TERM GOAL #3   Title  Pt will demonstrate ability to retrieve a lightweight object from overhead shelf with -15 elbow  extension fo RUE.    Baseline  RUE -20(Pt reports injuring elbow), LUE -5    Time  8    Period  Weeks    Status  New      OT LONG TERM GOAL #4   Title  Pt will perform all basic ADLs modified indpendently.    Baseline  requires occasional min A form son with pants and shirt    Time  8    Period  Weeks    Status  New            Plan - 09/15/19 1126    Clinical Impression Statement  Pt is a 49 y.o. male who presents to outpatient O.T. for evaluation. Pt diagnosed with Parkinson's disease in 2016 and presents today with decreased coordination, visual deficits, increased bradykinesia, decreased functional mobility, cognitive deficits, decreased ROM and  rigidity which impedes perfromance of ADLs/ IADLs. Pt would benefit from O.T. to address these deficits, in order to maximize safety and independence with ADLS/ IADLS. Pt reports new increased difficulty with ADLS and work activities due to his PD.PMH: PD  since 2016, HTN, heart murmur, MRI from 2019 shows L3-4, L4-5 disc bulge, no nerve involvement    OT Occupational Profile and History  Detailed Assessment- Review of Records and additional review of physical, cognitive, psychosocial history related to current functional performance    Occupational performance deficits (Please refer to  evaluation for details):  ADL's;IADL's;Rest and Sleep;Work;Leisure;Social Participation;Play    Body Structure / Function / Physical Skills  ADL;Endurance;UE functional use;Balance;Flexibility;Pain;FMC;Vision;Gait;ROM;GMC;Coordination;Decreased knowledge of precautions;IADL;Decreased knowledge of use of DME;Dexterity;Strength;Mobility;Tone    Cognitive Skills  Attention;Emotional;Energy/Drive;Memory;Perception;Problem Solve;Safety Awareness;Sequencing;Thought;Understand    Rehab Potential  Good    Clinical Decision Making  Limited treatment options, no task modification necessary    Comorbidities Affecting Occupational Performance:  May have comorbidities impacting occupational performance    Modification or Assistance to Complete Evaluation   No modification of tasks or assist necessary to complete eval    OT Frequency  2x / week    OT Duration  8 weeks    OT Treatment/Interventions  Self-care/ADL training;Energy conservation;Aquatic Therapy;DME and/or AE instruction;Ultrasound;Patient/family education;Passive range of motion;Balance training;Cryotherapy;Fluidtherapy;Splinting;Therapist, nutritional;Therapeutic activities;Manual Therapy;Therapeutic exercise;Moist Heat;Neuromuscular education;Cognitive remediation/compensation;Visual/perceptual remediation/compensation    Plan  initiate PD specific HEP.    Consulted and Agree with Plan of Care  Patient       Patient will benefit from skilled therapeutic intervention in order to improve the following deficits and impairments:   Body Structure / Function / Physical Skills: ADL, Endurance, UE functional use,  Balance, Flexibility, Pain, FMC, Vision, Gait, ROM, GMC, Coordination, Decreased knowledge of precautions, IADL, Decreased knowledge of use of DME, Dexterity, Strength, Mobility, Tone Cognitive Skills: Attention, Emotional, Energy/Drive, Memory, Perception, Problem Solve, Safety Awareness, Sequencing, Thought, Understand     Visit Diagnosis: Other symptoms and signs involving the nervous system - Plan: Ot plan of care cert/re-cert  Other symptoms and signs involving the musculoskeletal system - Plan: Ot plan of care cert/re-cert  Abnormal posture - Plan: Ot plan of care cert/re-cert  Other lack of coordination - Plan: Ot plan of care cert/re-cert  Other abnormalities of gait and mobility - Plan: Ot plan of care cert/re-cert  Frontal lobe and executive function deficit - Plan: Ot plan of care cert/re-cert  Attention and concentration deficit - Plan: Ot plan of care cert/re-cert    Problem List Patient Active Problem List   Diagnosis Date Noted  . Acquired hammer toe of left foot 03/10/2019  . Right lumbar radiculopathy 12/27/2017  . Gastroparesis 07/06/2017  . Orthostatic dizziness 05/12/2016  . Low back pain 05/12/2016  . Chronic constipation 12/17/2015  . Chronic insomnia 12/17/2015  . Left hip pain 12/16/2015  . Parkinson's disease ([place]) 09/11/2015  . HYPERTENSION 11/21/2008  . MURMUR 11/21/2008  . CHEST PAIN, ATYPICAL 11/21/2008    Taiwana Willison 09/15/2019, 12:03 PM Theone Murdoch, OTR/L [phone] Phone: [phone] 12:19 PM 09/15/19 [facility] [address] [place], [place], [address] Phone: [phone]   Fax:  [phone]  Name: Ruben Silva MRN: [id number] Date of Birth: Jun 18, 1971

## 2019-09-20 ENCOUNTER — Encounter: Payer: 59 | Admitting: Occupational Therapy

## 2019-09-20 ENCOUNTER — Other Ambulatory Visit: Payer: Self-pay

## 2019-09-20 ENCOUNTER — Ambulatory Visit: Payer: 59

## 2019-09-20 DIAGNOSIS — R1312 Dysphagia, oropharyngeal phase: Secondary | ICD-10-CM

## 2019-09-20 DIAGNOSIS — R2689 Other abnormalities of gait and mobility: Secondary | ICD-10-CM | POA: Diagnosis not present

## 2019-09-20 DIAGNOSIS — R41841 Cognitive communication deficit: Secondary | ICD-10-CM

## 2019-09-20 DIAGNOSIS — R471 Dysarthria and anarthria: Secondary | ICD-10-CM

## 2019-09-20 NOTE — Therapy (Signed)
[facility] [address] [place], [place], [address] Phone: [phone]   Fax:  [phone]  Speech Language Pathology Evaluation  Patient Details  Name: Ruben Silva MRN: [id number] Date of Birth: [birth year] Referring Provider (SLP): Alonza Bogus, DO   Encounter Date: 09/20/2019  End of Session - 09/20/19 1703    Visit Number  1    Number of Visits  17    Date for SLP Re-Evaluation  11/24/19    Authorization - Visit Number  1    Authorization - Number of Visits  20    SLP Start Time  6314    SLP Stop Time   9702    SLP Time Calculation (min)  40 min    Activity Tolerance  Patient tolerated treatment well;No increased pain       Past Medical History:  Diagnosis Date  . Bell's palsy   . Constipation   . Erectile dysfunction   . History of stomach ulcers   . Hypertension   . Insomnia   . Knee pain   . Murmur   . Parkinson disease ([place])   . Parkinsons ([place])   . Weakness generalized     Past Surgical History:  Procedure Laterality Date  . NO PAST SURGERIES      There were no vitals filed for this visit.  Subjective Assessment - 09/20/19 1230    Subjective  Pt arrived 1225 for his 1315 ST eval. PD screen showed pt with suboptimal volume. Pt with MBSS earlier this month as WFL/WNL - alternate solid/liquid and moisten food with gravies and sauces.    Currently in Pain?  No/denies         SLP Evaluation [facility] - 09/20/19 1230      SLP Visit Information   SLP Received On  09/20/19    Referring Provider (SLP)  Tat, Wells Guiles, DO    Onset Date  5 years ago    Medical Diagnosis  Parkinsons Disease      Subjective   Patient/Family Stated Goal  "Talk louder so people can hear me and it sounds more clear."      General Information   HPI  Pt had outpatient Modified Barium Swallow to assess his swallow function - WFL-WNL results with precuations of moisten foods and alternate bite/sip. Esophageal  stricture last stretched 08/2019. Pt was here for PD screen with suboptimal speech volume and c/o pharyngeal residuals after recent dilation.       Prior Functional Status   Cognitive/Linguistic Baseline  Within functional limits    Type of Home  House     Lives With  Spouse    Available Support  Family    Vocation  Full time employment      Cognition   Overall Cognitive Status  Impaired/Different from baseline   further testing necessary   Behaviors  Impulsive      Oral Motor/Sensory Function   Overall Oral Motor/Sensory Function  Impaired    Labial Strength  --   deferred due to covid-19 risk   Labial Coordination  Reduced    Lingual Strength  --   deferred due to covid-19 risk   Lingual Coordination  Reduced    Facial ROM  --   reduced abduction (eyes wide)   Velum  --   deferred due to covid-19 risk     Motor Speech   Overall Motor Speech  Impaired    Phonation  Low vocal intensity   average  mid 60s, masked 30cm in 5 minutes conversation   Intelligibility  Intelligible    Effective Techniques  Increased vocal intensity   min-mod A occasionally for volume in 5 minutes conversation   Phonation  WFL   more frequently people asking him to repeat in noise                     SLP Education - 09/20/19 1702    Education Details  swallow eval results, swallow precautions (pt did not recall these), loud "hey" and pitch HEP, must do HEP BID    Person(s) Educated  Patient    Methods  Explanation;Demonstration;Verbal cues;Handout    Comprehension  Verbalized understanding;Returned demonstration;Verbal cues required;Need further instruction       SLP Short Term Goals - 09/20/19 1709      SLP SHORT TERM GOAL #1   Title  Pt will generate loud /a/ or extended "hey!" with average of low 90s dB over 4 sessions    Time  4    Period  Weeks    Status  New      SLP SHORT TERM GOAL #2   Title  Pt will use speech volume average low 70sdB when responding with  sentences 18/20 over 3 sessions    Time  4    Period  Weeks    Status  New      SLP SHORT TERM GOAL #3   Title  pt will use abdominal breathing at rest 70% of the time over two sessions    Time  4    Period  Weeks    Status  New      SLP SHORT TERM GOAL #4   Title  in 5-7 minutes simple conversation pt will generate speech volume of low 70s over 3 sessions    Time  4    Period  Weeks    Status  Achieved      SLP SHORT TERM GOAL #5   Title  pt will complete cognitive linguistic testing in the first 1-2 sessions    Time  2    Period  --   sessions   Status  New       SLP Long Term Goals - 09/20/19 1711      SLP LONG TERM GOAL #1   Title  Pt will generate loud /a/ with average of low-mid 90s dB over 4 total sessions    Time  8    Period  Weeks   or 17 total sessions; for all LTGs   Status  New      SLP LONG TERM GOAL #2   Title  pt will use abdominal breathing 65% of the time in 8 minutes conversation over 3 sessions    Time  8    Period  Weeks    Status  New      SLP LONG TERM GOAL #3   Title  in 8 minutes mod complex conversation pt will generate speech volume of low 70s with nonverbal cues, over 3 sessions    Time  8    Period  Weeks    Status  New      SLP LONG TERM GOAL #4   Title  pt will follow swallow precautions with POs in 3 sessions with modified independence    Time  8    Period  Weeks    Status  New       Plan - 09/20/19 1704  Clinical Impression Statement  Pt presents today with dysarthria c/b decr'd vocal volume in the mid 60's dB with sound level meter placed 30 cm away from pt's mouth, and pt masked. Pt with shallow, mostly clavicular, breathing during vocalization. Pt performed adequate, appropriate loud "hey!" and pitch variation exercises with SLP demonstrations. SLP told pt to complete BID each day and explained rationale for frequency. SLP also educated pt with his swallow precauations from modified (MBSS) as pt did not know what they were  (see "pt education"/"pt instructions"). Pt is good candidate for incr'ing voice volume based upon today's eval - he incr'd volume to WNL with occasional cues to do so in 5 minutes of conversation. Pt should also receive cognitive linguistic assessment in the first 1-2 sessions and goals added as necessary. Pt would benefit from skilled ST targeting incr'd speech volume, adherence to aspiration precations, and likely improving pt's cognitive communication skills.    Speech Therapy Frequency  2x / week    Duration  --   8 weeks or 17 total sessions; pt allowed 24 ST/year so may end early to conserve visits   Treatment/Interventions  Aspiration precaution training;Pharyngeal strengthening exercises;Diet toleration management by SLP;Internal/external aids;Patient/family education;Cognitive reorganization;Compensatory strategies;SLP instruction and feedback;Functional tasks;Environmental controls;Oral motor exercises    Potential to Achieve Goals  Good    Potential Considerations  Ability to learn/carryover information;Severity of impairments    Consulted and Agree with Plan of Care  Patient       Patient will benefit from skilled therapeutic intervention in order to improve the following deficits and impairments:   Dysarthria and anarthria  Dysphagia, oropharyngeal phase  Cognitive communication deficit    Problem List Patient Active Problem List   Diagnosis Date Noted  . Acquired hammer toe of left foot 03/10/2019  . Right lumbar radiculopathy 12/27/2017  . Gastroparesis 07/06/2017  . Orthostatic dizziness 05/12/2016  . Low back pain 05/12/2016  . Chronic constipation 12/17/2015  . Chronic insomnia 12/17/2015  . Left hip pain 12/16/2015  . Parkinson's disease (HCC) 09/11/2015  . HYPERTENSION 11/21/2008  . MURMUR 11/21/2008  . CHEST PAIN, ATYPICAL 11/21/2008    [facility] ,MS, CCC-SLP  09/20/2019, 5:13 PM  [facility] [address] [place], [place], [address] Phone: [phone]   Fax:  [phone]  Name: Ruben Silva MRN: [id number] Date of Birth: [birth year]

## 2019-09-20 NOTE — Patient Instructions (Signed)
  TWICE EVERY DAY: 10 loud "HEY!" 10 "ooooooo" - Molli Barrows -> Prince --> Molli Barrows     With your swallowing: Moisten foods with gravies or sauces, or condiments whenever you can Alternate bite/sip/bite/sip/bite/sip... etc.

## 2019-09-22 ENCOUNTER — Ambulatory Visit: Payer: 59 | Admitting: Occupational Therapy

## 2019-09-22 ENCOUNTER — Ambulatory Visit: Payer: 59 | Admitting: Physical Therapy

## 2019-09-26 ENCOUNTER — Other Ambulatory Visit: Payer: Self-pay

## 2019-09-26 ENCOUNTER — Ambulatory Visit: Payer: 59 | Admitting: Physical Therapy

## 2019-09-26 ENCOUNTER — Ambulatory Visit: Payer: 59 | Admitting: Speech Pathology

## 2019-09-26 DIAGNOSIS — R293 Abnormal posture: Secondary | ICD-10-CM

## 2019-09-26 DIAGNOSIS — R29818 Other symptoms and signs involving the nervous system: Secondary | ICD-10-CM

## 2019-09-26 DIAGNOSIS — R2689 Other abnormalities of gait and mobility: Secondary | ICD-10-CM | POA: Diagnosis not present

## 2019-09-26 DIAGNOSIS — R41841 Cognitive communication deficit: Secondary | ICD-10-CM

## 2019-09-26 NOTE — Therapy (Signed)
[facility] [address] [place], [place], [address] Phone: [phone]   Fax:  [phone]  Physical Therapy Treatment  Patient Details  Name: Ruben Silva MRN: [id number] Date of Birth: [birth year] Referring Provider (PT): Tat, Wells Guiles   Encounter Date: 09/26/2019  PT End of Session - 09/26/19 1219    Visit Number  3    Number of Visits  18    Authorization Type  [employer] 24 PT, 24 OT, 24 ST    Authorization - Visit Number  3    Authorization - Number of Visits  24    PT Start Time  [id number]    PT Stop Time  0930    PT Time Calculation (min)  38 min    Activity Tolerance  Patient tolerated treatment well    Behavior During Therapy  [facility] for tasks assessed/performed       Past Medical History:  Diagnosis Date  . Bell's palsy   . Constipation   . Erectile dysfunction   . History of stomach ulcers   . Hypertension   . Insomnia   . Knee pain   . Murmur   . Parkinson disease ([place])   . Parkinsons ([facility])   . Weakness generalized     Past Surgical History:  Procedure Laterality Date  . NO PAST SURGERIES      There were no vitals filed for this visit.  Subjective Assessment - 09/26/19 0855    Subjective  I am trying to see a new neurologist on Friday.No falls    Diagnostic tests  MRI from 2019 shows L3-4, L4-5 disc bulge, no nerve involvement    Patient Stated Goals  Pt's goal for physical therapy:  to get back to where I was, to avoid the Parkinson's from advancing.    Currently in Pain?  No/denies    Pain Onset  1 to 4 weeks ago    Pain Onset  More than a month ago    Pain Onset  1 to 4 weeks ago                       [facility] Adult PT Treatment/Exercise - 09/26/19 0001      Ambulation/Gait   Ambulation/Gait  Yes    Ambulation/Gait Assistance  5: Supervision;6: Modified independent (Device/Increase time)    Ambulation/Gait Assistance Details  Cues for increased step length,  heelstrike, improved posture.  Utilized bilateral walking poles to facilitate reciprocal arm swing    Ambulation Distance (Feet)  230 Feet   350   Assistive device  None    Gait Pattern  Step-through pattern;Decreased arm swing - right;Decreased arm swing - left;Decreased step length - left;Decreased step length - right;Decreased trunk rotation;Trunk flexed;Narrow base of support    Ambulation Surface  Level;Indoor      Exercises   Exercises  Knee/Hip      Knee/Hip Exercises: Stretches   Gastroc Stretch  Right;3 reps;30 seconds    Gastroc Stretch Limitations  Trialed R foot propped at 4" step, then runner's stretch; pt c/o pain with both stretches    Other Knee/Hip Stretches  Stagger stance forward/back rocking for dynamic ankle dorsiflexion x 10 reps each foot position.      Knee/Hip Exercises: Aerobic   Stepper  SciFit, Seated Stepper, Level 2.5, 4 extremities, x 8 minutes; cues to keep RPM >80-90 (at times, pt able to go >100)       Pt performs PWR! Moves in  seated position-Review of HEP given last visit   PWR! Up for improved posture x 20 reps  PWR! Rock for improved weighshifting x 20 reps  PWR! Twist for improved trunk rotation x 10 reps each side   PWR! Step for improved step initiation x 5 reps single leg step out/in, then x 5 reps both legs step out/out, in/in  Verbal and visual cues provided for correct technique, posture, intensity of movement patterns.    Pt performs PWR! Moves in standing position x 10 reps-with chair in front as needed for support   PWR! Up for improved posture-pt c/o knee pain (may need to modify with push-up through UE's versus squat to avoid knee pain)  PWR! Rock for improved weighshifting  PWR! Twist for improved trunk rotation   PWR! Step for improved step initiation-Not performed, due to pt's c/o knee and calf pain, transitioning to stretches to address  Visual, verbal cues provided for correct technique, posture, intensity of movement  patterns.         Self Care:  Pt has questions about upcoming (new) neurologist appointment.  Discussed preparing a list to discuss with the neurologist, including symptoms that have worsened or that or new since he was last seen.    PT Education - 09/26/19 1218    Education Details  Information from "[employer]" manual for preparing for MD visit; discussed using PWR! Moves YouTube as resource for correctly performing seated PWR! Moves    Person(s) Educated  Patient    Methods  Explanation;Handout    Comprehension  Verbalized understanding       PT Short Term Goals - 09/13/19 2027      PT SHORT TERM GOAL #1   Title  Patient will be independent with HEP to address strength, balance, posture.  TARGET 5 weeks:  10/13/2019    Time  5    Period  Weeks    Status  New    Target Date  10/13/19      PT SHORT TERM GOAL #2   Title  Pt will improve 5x sit<>stand to less than or equal to 10 seconds for improved functional lower extremity strength.    Time  5    Period  Weeks    Status  New    Target Date  10/13/19      PT SHORT TERM GOAL #3   Title  Pt will verbalize at least 3 means to decrease low back pain, for overall improved funcitonal mobility.    Time  5    Period  Weeks    Status  New    Target Date  10/13/19      PT SHORT TERM GOAL #4   Title  Pt will improve TUG/TUG manual score to less than or equal to 10% difference for improved dual tasking with gait.    Time  5    Period  Weeks    Status  New    Target Date  10/13/19        PT Long Term Goals - 09/13/19 2033      PT LONG TERM GOAL #1   Title  Patient will be independent with updated HEP and able to verbalize a plan for appropriate community-based exercise upon discharge from PT. TARGET 11/10/2019    Time  9    Period  Weeks    Status  New      PT LONG TERM GOAL #2   Title  Pt will improve gait velocity  to at least 2.62 ft/sec for improved gait efficiency and safety.    Time  5    Period  Weeks       PT LONG TERM GOAL #3   Title  Pt will improve to at least 385 ft for improved gait efficiency and safety.    Time  5    Period  Weeks    Status  New      PT LONG TERM GOAL #4   Title  Pt will verbalize understanding of posture and body mechanics for decreased back/neck pain, improved participation with functional/work activities.    Time  8    Period  Weeks    Status  New            Plan - 09/26/19 1219    Clinical Impression Statement  Focus of skilled PT session today was to perform aerobic warm-up and gait activity followed by review/performance of PWR! Moves-seated and standing positions.  Pt limited in standing PWR! Moves today due to R knee pain.  With all activities in PT session, PT provides cues and rationale for larger intensity movement patterns, to help improve carryover for larger patterns of movement with ADLs.    Personal Factors and Comorbidities  Comorbidity 3+    Comorbidities  PMH:  Bell's palsy, HTN, knee pain, PD, esophageal stricture (stretched 08/2019), R chondromalacia patella, R knee OA, GERD    Examination-Activity Limitations  Locomotion Level;Transfers    Examination-Participation Restrictions  Community Activity;Other   Exercise   Stability/Clinical Decision Making  Stable/Uncomplicated    Rehab Potential  Good    PT Frequency  Other (comment)   1x/wk (eval), then 2x/wk for 8 weeks   PT Duration  Other (comment)   Plus eval visit; total POC = 9 weeks   PT Treatment/Interventions  ADLs/Self Care Home Management;Neuromuscular re-education;Balance training;Therapeutic exercise;Therapeutic activities;Functional mobility training;Gait training;Patient/family education    PT Next Visit Plan  Xontinue to work on aerobic activity (SciFit/NuStep), gait with reciprocal arm swing/intensity/resisted gait, postural strengthening and stretching; PWR! Moves in standing (may need to be modified due to knee pain)    Consulted and Agree with Plan of Care   Patient       Patient will benefit from skilled therapeutic intervention in order to improve the following deficits and impairments:  Abnormal gait, Decreased coordination, Difficulty walking, Pain, Decreased balance, Improper body mechanics, Postural dysfunction, Decreased strength, Decreased mobility  Visit Diagnosis: Other symptoms and signs involving the nervous system  Abnormal posture  Other abnormalities of gait and mobility     Problem List Patient Active Problem List   Diagnosis Date Noted  . Acquired hammer toe of left foot 03/10/2019  . Right lumbar radiculopathy 12/27/2017  . Gastroparesis 07/06/2017  . Orthostatic dizziness 05/12/2016  . Low back pain 05/12/2016  . Chronic constipation 12/17/2015  . Chronic insomnia 12/17/2015  . Left hip pain 12/16/2015  . Parkinson's disease ([facility]CC) 09/11/2015  . HYPERTENSION 11/21/2008  . MURMUR 11/21/2008  . CHEST PAIN, ATYPICAL 11/21/2008    Maryum Batterson W. 09/26/2019, 12:24 PM  Gean Maidens., PT   [facility] [address] [place], [place], [address] Phone: [phone]   Fax:  [phone]  Name: Ruben Silva MRN: [id number] Date of Birth: [birth year]

## 2019-09-26 NOTE — Therapy (Signed)
[facility] [address] [place], [place], [address] Phone: [phone]   Fax:  [phone]  Speech Language Pathology Treatment  Patient Details  Name: Ruben Silva MRN: [id number] Date of Birth: Aug 01, 1971 Referring Provider (SLP): Alonza Bogus, DO   Encounter Date: 09/26/2019  End of Session - 09/26/19 1112    Visit Number  2    Number of Visits  17    Date for SLP Re-Evaluation  11/24/19    Authorization - Visit Number  2    Authorization - Number of Visits  20    SLP Start Time  0934    SLP Stop Time   1017    SLP Time Calculation (min)  43 min    Activity Tolerance  Patient tolerated treatment well       Past Medical History:  Diagnosis Date  . Bell's palsy   . Constipation   . Erectile dysfunction   . History of stomach ulcers   . Hypertension   . Insomnia   . Knee pain   . Murmur   . Parkinson disease ([place])   . Parkinsons ([place])   . Weakness generalized     Past Surgical History:  Procedure Laterality Date  . NO PAST SURGERIES      There were no vitals filed for this visit.  Subjective Assessment - 09/26/19 0934    Subjective  "What time do I end here?"    Currently in Pain?  No/denies            ADULT SLP TREATMENT - 09/26/19 1111      General Information   Behavior/Cognition  Alert;Pleasant mood;Cooperative      Treatment Provided   Treatment provided  Cognitive-Linquistic      Pain Assessment   Pain Assessment  No/denies pain      Cognitive-Linquistic Treatment   Treatment focused on  Cognition;Patient/family/caregiver education    Skilled Treatment  Administed Cognitive Linguistic Quick Test for assessment of cognition. Scores as follows: Attention 193/215, Memory 157/185, Executive Functions 31/40, Language 34/37, Visuospatial skills 88/105 (all WNL for ages 79-69) and Clock Drawing 11/13 (mild impairment). SLP noted impulsivity, decreased attention to detail  during testing. Pt was fixated on his testing performance; SLP educated that pt function is more important than testing scores. He reports functional deficits at work, including forgetting tasks or conversations, repeating himself, mental fatigue, difficulty organizing: "I think they're noticing I'm slipping." SLP educated pt re: clinical observations that pt made errors more frequently when rushing, and was more likely to catch his errors when he slowed down. Patient agreed with this.       Assessment / Recommendations / Plan   Plan  Continue with current plan of care      Progression Toward Goals   Progression toward goals  Progressing toward goals       SLP Education - 09/26/19 1112    Education Details  cognitive deficit areas; as difficulties arise pt will need to adapt and use strategies to help cognitive function    Person(s) Educated  Patient    Methods  Explanation;Demonstration    Comprehension  Verbalized understanding;Need further instruction       SLP Short Term Goals - 09/26/19 1116      SLP SHORT TERM GOAL #1   Title  Pt will generate loud /a/ or extended "hey!" with average of low 90s dB over 4 sessions    Time  4    Period  Weeks    Status  On-going      SLP SHORT TERM GOAL #2   Title  Pt will use speech volume average low 70sdB when responding with sentences 18/20 over 3 sessions    Time  4    Period  Weeks    Status  On-going      SLP SHORT TERM GOAL #3   Title  pt will use abdominal breathing at rest 70% of the time over two sessions    Time  4    Period  Weeks    Status  On-going      SLP SHORT TERM GOAL #4   Title  in 5-7 minutes simple conversation pt will generate speech volume of low 70s over 3 sessions    Time  4    Period  Weeks    Status  On-going      SLP SHORT TERM GOAL #5   Title  pt will complete cognitive linguistic testing in the first 1-2 sessions    Baseline  CLQT 09/26/19    Time  2    Period  --   sessions   Status  Achieved       Additional Short Term Goals   Additional Short Term Goals  Yes      SLP SHORT TERM GOAL #6   Title  Patient will use at least one compensatory strategy for memory or attention in 2 therapy sessions.    Time  4    Period  Weeks    Status  New      SLP SHORT TERM GOAL #7   Title  Patient will ID when impulsivity negatively impacts his performance in cognitive tasks in 75% of opportunities x 2 sessions.    Time  4    Period  Weeks    Status  New       SLP Long Term Goals - 09/26/19 1136      SLP LONG TERM GOAL #1   Title  Pt will generate loud /a/ with average of low-mid 90s dB over 4 total sessions    Time  8    Period  Weeks   or 17 total sessions; for all LTGs   Status  On-going      SLP LONG TERM GOAL #2   Title  pt will use abdominal breathing 65% of the time in 8 minutes conversation over 3 sessions    Time  8    Period  Weeks    Status  On-going      SLP LONG TERM GOAL #3   Title  in 8 minutes mod complex conversation pt will generate speech volume of low 70s with nonverbal cues, over 3 sessions    Time  8    Period  Weeks    Status  On-going      SLP LONG TERM GOAL #4   Title  pt will follow swallow precautions with POs in 3 sessions with modified independence    Time  8    Period  Weeks    Status  On-going      SLP LONG TERM GOAL #5   Title  Patient will report use of memory compensation system to recall/manage tasks at work outside of ST between 4 sessions.    Time  8    Period  Weeks    Status  New      Additional Long Term Goals   Additional Long Term Goals  Yes  SLP LONG TERM GOAL #6   Title  Pt will use slow rate and double checking to decrease errors in functional tasks (95% accuracy with double checking).    Time  8    Period  Weeks    Status  New       Plan - 09/26/19 1113    Clinical Impression Statement  Patient presents with decreased vocal volume (average mid 60s dB at 30 cm at evaluation). Cognitive assessment performed today  with overall scores (CLQT) measuring WNL, however functional deficits reported (recall, attention) and SLP observed impulsivity, decreased attention to detail and diminished executive function skills during assessment. Goals for cognition added. Pt would benefit from skilled ST targeting incr'd speech volume, adherence to aspiration precations, and likely improving pt's cognitive communication skills.    Speech Therapy Frequency  2x / week    Duration  --   8 weeks or 17 total sessions; pt allowed 24 ST/year so may end early to conserve visits   Treatment/Interventions  Aspiration precaution training;Pharyngeal strengthening exercises;Diet toleration management by SLP;Internal/external aids;Patient/family education;Cognitive reorganization;Compensatory strategies;SLP instruction and feedback;Functional tasks;Environmental controls;Oral motor exercises    Potential to Achieve Goals  Good    Potential Considerations  Ability to learn/carryover information;Severity of impairments    Consulted and Agree with Plan of Care  Patient       Patient will benefit from skilled therapeutic intervention in order to improve the following deficits and impairments:   Cognitive communication deficit    Problem List Patient Active Problem List   Diagnosis Date Noted  . Acquired hammer toe of left foot 03/10/2019  . Right lumbar radiculopathy 12/27/2017  . Gastroparesis 07/06/2017  . Orthostatic dizziness 05/12/2016  . Low back pain 05/12/2016  . Chronic constipation 12/17/2015  . Chronic insomnia 12/17/2015  . Left hip pain 12/16/2015  . Parkinson's disease ([facility]) 09/11/2015  . HYPERTENSION 11/21/2008  . MURMUR 11/21/2008  . CHEST PAIN, ATYPICAL 11/21/2008   Rondel Baton, MS, CCC-SLP Speech-Language Pathologist  Arlana Lindau 09/26/2019, 11:37 AM  [facility] [address] [place], [place], [address] Phone: [phone]   Fax:   [phone]   Name: Gerrad Welker MRN: [id number] Date of Birth: 1970/12/25

## 2019-09-26 NOTE — Patient Instructions (Signed)
Provided handout copies from [facility] "Every Victory Counts" booklet, regarding preparing for upcoming MD (neurologist) visit.

## 2019-09-29 ENCOUNTER — Telehealth: Payer: Self-pay | Admitting: Neurology

## 2019-09-29 ENCOUNTER — Ambulatory Visit: Payer: 59 | Admitting: Physical Therapy

## 2019-09-29 ENCOUNTER — Other Ambulatory Visit: Payer: Self-pay

## 2019-09-29 ENCOUNTER — Encounter: Payer: Self-pay | Admitting: Physical Therapy

## 2019-09-29 ENCOUNTER — Ambulatory Visit: Payer: 59 | Admitting: Occupational Therapy

## 2019-09-29 ENCOUNTER — Encounter: Payer: Self-pay | Admitting: Occupational Therapy

## 2019-09-29 DIAGNOSIS — R2689 Other abnormalities of gait and mobility: Secondary | ICD-10-CM

## 2019-09-29 DIAGNOSIS — R293 Abnormal posture: Secondary | ICD-10-CM

## 2019-09-29 DIAGNOSIS — R41840 Attention and concentration deficit: Secondary | ICD-10-CM

## 2019-09-29 DIAGNOSIS — R41844 Frontal lobe and executive function deficit: Secondary | ICD-10-CM

## 2019-09-29 DIAGNOSIS — R29898 Other symptoms and signs involving the musculoskeletal system: Secondary | ICD-10-CM

## 2019-09-29 DIAGNOSIS — R29818 Other symptoms and signs involving the nervous system: Secondary | ICD-10-CM

## 2019-09-29 DIAGNOSIS — R278 Other lack of coordination: Secondary | ICD-10-CM

## 2019-09-29 NOTE — Telephone Encounter (Signed)
He has an appointment in a few days.  The last MANY visits have been work ins for him.  If the issue is sleep, he will need to see his PCP.  In the past, many of his issues have been anxiety/stress related.  His Parkinsons Disease has been stable when I last saw him.

## 2019-09-29 NOTE — Telephone Encounter (Signed)
Please advise on below  

## 2019-09-29 NOTE — Telephone Encounter (Signed)
Patient called with with concerns his PD is getting worse. He said he has been out of work for a week because his job is not certain he can continue with his job duties.  Patient wants to see Dr. Arbutus Leas in the office because of his worsening condition. He said he also has been only sleeping about 2 hours.   Patient has an appointment on 10/04/19 at 9:15 AM for a virtual visit with Dr. Arbutus Leas. However, he is not sure this will be sufficient considering the challenges he is facing physically and at work.

## 2019-09-29 NOTE — Therapy (Signed)
[facility] [address] [place], [place], [address] Phone: [phone]   Fax:  [phone]  Occupational Therapy Treatment  Patient Details  Name: Ruben Silva MRN: [id number] Date of Birth: November 07, 1970 Referring Provider (OT): Dr. Carles Collet   Encounter Date: 09/29/2019  OT End of Session - 09/29/19 1001    Visit Number  2    Number of Visits  16    Date for OT Re-Evaluation  11/14/19    Authorization Type  [employer] -24 visits OT    Authorization - Visit Number  2    Authorization - Number of Visits  24    OT Start Time  0810    OT Stop Time  0843    OT Time Calculation (min)  33 min    Activity Tolerance  Patient tolerated treatment well    Behavior During Therapy  [facility] for tasks assessed/performed       Past Medical History:  Diagnosis Date  . Bell's palsy   . Constipation   . Erectile dysfunction   . History of stomach ulcers   . Hypertension   . Insomnia   . Knee pain   . Murmur   . Parkinson disease ([place])   . Parkinsons ([place])   . Weakness generalized     Past Surgical History:  Procedure Laterality Date  . NO PAST SURGERIES      There were no vitals filed for this visit.  Subjective Assessment - 09/29/19 1002    Currently in Pain?  Yes    Pain Score  6     Pain Location  Knee    Pain Orientation  Right    Pain Descriptors / Indicators  Aching    Pain Type  Chronic pain    Pain Onset  1 to 4 weeks ago    Pain Frequency  Intermittent    Aggravating Factors   walking    Pain Relieving Factors  rest                           OT Treatment/ Education - 09/29/19 0959    Education Details  PWR! moves seated 10-20 reps each, min v.c for larger amplitude movements, coordination HEP see pt instructions, discussed importance of taking medication( carbodopa-levadopa) first thing in the moring to help with movements during ADLs, pt waits until after dressing to take currently.     Person(s) Educated  Patient    Methods  Explanation;Demonstration;Verbal cues;Handout    Comprehension  Verbalized understanding;Returned demonstration;Verbal cues required       OT Short Term Goals - 09/15/19 1153      OT SHORT TERM GOAL #1   Title  I with PD specific HEP    Time  4    Period  Weeks    Status  New    Target Date  10/15/19      OT SHORT TERM GOAL #2   Title  I with adapted strategies for ADLS/ IADLS in order to maximize safety and independence    Time  4    Period  Weeks    Status  New      OT SHORT TERM GOAL #3   Title  Pt will demonstrate improved LUE fine motor coordination for ADLs as evidenced by decreasing 9 hole peg test score to 30 secs or less.    Baseline  RUE 20.96 LUE 33.28,    Time  4  Period  Weeks    Status  New      OT SHORT TERM GOAL #4   Title  Pt will verbalize understanding of compensatory strategies for memory/ cognitive deficits and ways to keep thinking skills sharp.    Time  4    Period  Weeks    Status  New        OT Long Term Goals - 09/15/19 1156      OT LONG TERM GOAL #1   Title  Pt will verbalize understanding of ways to prevent future PD related complications.-11/14/19    Time  8    Period  Weeks    Status  New    Target Date  11/14/19      OT LONG TERM GOAL #2   Title  Pt will demonstrate increased fine motor coordination for ADLs as evidenced by decreasing 3 button/ unbutton test to 43 secs or less.    Baseline  48 secs    Time  8    Period  Weeks    Status  New      OT LONG TERM GOAL #3   Title  Pt will demonstrate ability to retrieve a lightweight object from overhead shelf with -15 elbow  extension fo RUE.    Baseline  RUE -20(Pt reports injuring elbow), LUE -5    Time  8    Period  Weeks    Status  New      OT LONG TERM GOAL #4   Title  Pt will perform all basic ADLs modified indpendently.    Baseline  requires occasional min A form son with pants and shirt    Time  8    Period  Weeks    Status  New             Plan - 09/29/19 0539    Clinical Impression Statement  Pt is progressing towards goals. He returned demonstration of PWR! moves seated with min v.c Pt reports he is going to see a new neurologist today. Pt states his PCP took him ourt of work x 1 week.    OT Occupational Profile and History  Detailed Assessment- Review of Records and additional review of physical, cognitive, psychosocial history related to current functional performance    Occupational performance deficits (Please refer to evaluation for details):  ADL's;IADL's;Rest and Sleep;Work;Leisure;Social Participation;Play    Body Structure / Function / Physical Skills  ADL;Endurance;UE functional use;Balance;Flexibility;Pain;FMC;Vision;Gait;ROM;GMC;Coordination;Decreased knowledge of precautions;IADL;Decreased knowledge of use of DME;Dexterity;Strength;Mobility;Tone    Cognitive Skills  Attention;Emotional;Energy/Drive;Memory;Perception;Problem Solve;Safety Awareness;Sequencing;Thought;Understand    Rehab Potential  Good    Clinical Decision Making  Limited treatment options, no task modification necessary    Comorbidities Affecting Occupational Performance:  May have comorbidities impacting occupational performance    Modification or Assistance to Complete Evaluation   No modification of tasks or assist necessary to complete eval    OT Frequency  2x / week    OT Duration  8 weeks    OT Treatment/Interventions  Self-care/ADL training;Energy conservation;Aquatic Therapy;DME and/or AE instruction;Ultrasound;Patient/family education;Passive range of motion;Balance training;Cryotherapy;Fluidtherapy;Splinting;Building services engineer;Therapeutic activities;Manual Therapy;Therapeutic exercise;Moist Heat;Neuromuscular education;Cognitive remediation/compensation;Visual/perceptual remediation/compensation    Plan  ADL strategies, donn / doff jacket,work towards goals    Consulted and Agree with Plan of Care  Patient        Patient will benefit from skilled therapeutic intervention in order to improve the following deficits and impairments:   Body Structure / Function / Physical Skills: ADL, Endurance, UE functional  use, Balance, Flexibility, Pain, FMC, Vision, Gait, ROM, GMC, Coordination, Decreased knowledge of precautions, IADL, Decreased knowledge of use of DME, Dexterity, Strength, Mobility, Tone Cognitive Skills: Attention, Emotional, Energy/Drive, Memory, Perception, Problem Solve, Safety Awareness, Sequencing, Thought, Understand     Visit Diagnosis: Other symptoms and signs involving the nervous system  Abnormal posture  Other symptoms and signs involving the musculoskeletal system  Other lack of coordination  Frontal lobe and executive function deficit  Attention and concentration deficit  Other abnormalities of gait and mobility    Problem List Patient Active Problem List   Diagnosis Date Noted  . Acquired hammer toe of left foot 03/10/2019  . Right lumbar radiculopathy 12/27/2017  . Gastroparesis 07/06/2017  . Orthostatic dizziness 05/12/2016  . Low back pain 05/12/2016  . Chronic constipation 12/17/2015  . Chronic insomnia 12/17/2015  . Left hip pain 12/16/2015  . Parkinson's disease ([facility]) 09/11/2015  . HYPERTENSION 11/21/2008  . MURMUR 11/21/2008  . CHEST PAIN, ATYPICAL 11/21/2008    Ruben Silva 09/29/2019, 10:02 AM  [facility] [address] [place], [place], [address] Phone: [phone]   Fax:  [phone]  Name: Ruben Silva MRN: [id number] Date of Birth: [birth year]

## 2019-09-29 NOTE — Telephone Encounter (Signed)
Please advise an alternate for patient to have a in person visit on another day, or keep same virtual appointment

## 2019-09-29 NOTE — Therapy (Signed)
[facility] [address] [place], [place], [address] Phone: [phone]   Fax:  [phone]  Physical Therapy Treatment  Patient Details  Name: Ruben Silva MRN: [id number] Date of Birth: 1971/07/09 Referring Provider (PT): Tat, Lurena Joiner   Encounter Date: 09/29/2019  PT End of Session - 09/29/19 0850    Visit Number  4    Number of Visits  18    Authorization Type  [employer]-VL 24 PT, 24 OT, 24 ST    Authorization - Visit Number  4    Authorization - Number of Visits  24    PT Start Time  0848    PT Stop Time  0929    PT Time Calculation (min)  41 min    Activity Tolerance  Patient tolerated treatment well    Behavior During Therapy  [facility] for tasks assessed/performed       Past Medical History:  Diagnosis Date  . Bell's palsy   . Constipation   . Erectile dysfunction   . History of stomach ulcers   . Hypertension   . Insomnia   . Knee pain   . Murmur   . Parkinson disease (HCC)   . Parkinsons (HCC)   . Weakness generalized     Past Surgical History:  Procedure Laterality Date  . NO PAST SURGERIES      There were no vitals filed for this visit.  Subjective Assessment - 09/29/19 0848    Subjective  No new complaitns No falls. Still with some right knee pain.    Diagnostic tests  MRI from 2019 shows L3-4, L4-5 disc bulge, no nerve involvement    Patient Stated Goals  Pt's goal for physical therapy:  to get back to where I was, to avoid the Parkinson's from advancing.    Currently in Pain?  Yes    Pain Score  6     Pain Location  Knee    Pain Orientation  Right    Pain Descriptors / Indicators  Aching    Pain Type  Chronic pain    Pain Onset  1 to 4 weeks ago    Pain Frequency  Intermittent    Aggravating Factors   increased walking    Pain Relieving Factors  tylenol              [facility] Adult PT Treatment/Exercise - 09/29/19 6606      Ambulation/Gait   Ambulation/Gait  Yes     Ambulation/Gait Assistance  5: Supervision;6: Modified independent (Device/Increase time)    Ambulation/Gait Assistance Details  use of walking sticks to facilitate increased arm swing for increased trunk rotation. added in enviromental scanning with emphasis on maintaining pace, pathway and arm swing with pole assistance.     Ambulation Distance (Feet)  345 Feet   x1, plus around gym with session   Assistive device  None    Gait Pattern  Step-through pattern;Decreased arm swing - right;Decreased arm swing - left;Decreased step length - left;Decreased step length - right;Decreased trunk rotation;Trunk flexed;Narrow base of support    Ambulation Surface  Level;Indoor      Exercises   Exercises  Other Exercises    Other Exercises   standing against doorframe (back against the frame): alternating reaching up to top of doorframe for trunk stretching x 10 each side; then bil UE raises to top of doorframe with extension back overhead for trunk extension/stretching for 10 reps. min guard assist for balance with cues on form/technique.  Knee/Hip Exercises: Aerobic   Other Aerobic  Scifit level 2.5 for 8 minutes with UE/LE's with goal rpm 80-90 for strengthening and acitivyt toelrance.       also worked on standing PWR! Exercises   PWR! Up for improved posture- no pain with performance today for 15 reps with cues on form/technique  PWR! Rock for improved weight shifting with cues on correct form  PWR! Twist for improved trunk rotation with cues to fully open/pause between reps toward each side for 10 reps each way.   PWR! Step for improved step initiation- pt able to perform today with cues on form/technique. Cues for increased step length and weight shifting.   Visual, verbal cues provided for correct technique, posture, intensity of movement patterns.      PT Education - 09/29/19 0917    Education Details  added new postural stretch at door frame    Person(s) Educated  Patient     Methods  Explanation;Demonstration;Verbal cues    Comprehension  Verbalized understanding;Returned demonstration       PT Short Term Goals - 09/13/19 2027      PT SHORT TERM GOAL #1   Title  Patient will be independent with HEP to address strength, balance, posture.  TARGET 5 weeks:  10/13/2019    Time  5    Period  Weeks    Status  New    Target Date  10/13/19      PT SHORT TERM GOAL #2   Title  Pt will improve 5x sit<>stand to less than or equal to 10 seconds for improved functional lower extremity strength.    Time  5    Period  Weeks    Status  New    Target Date  10/13/19      PT SHORT TERM GOAL #3   Title  Pt will verbalize at least 3 means to decrease low back pain, for overall improved funcitonal mobility.    Time  5    Period  Weeks    Status  New    Target Date  10/13/19      PT SHORT TERM GOAL #4   Title  Pt will improve TUG/TUG manual score to less than or equal to 10% difference for improved dual tasking with gait.    Time  5    Period  Weeks    Status  New    Target Date  10/13/19        PT Long Term Goals - 09/13/19 2033      PT LONG TERM GOAL #1   Title  Patient will be independent with updated HEP and able to verbalize a plan for appropriate community-based exercise upon discharge from PT. TARGET 11/10/2019    Time  9    Period  Weeks    Status  New      PT LONG TERM GOAL #2   Title  Pt will improve gait velocity to at least 2.62 ft/sec for improved gait efficiency and safety.    Time  5    Period  Weeks      PT LONG TERM GOAL #3   Title  Pt will improve 3MWT to at least 385 ft for improved gait efficiency and safety.    Time  5    Period  Weeks    Status  New      PT LONG TERM GOAL #4   Title  Pt will verbalize understanding of posture and body mechanics for decreased  back/neck pain, improved participation with functional/work activities.    Time  8    Period  Weeks    Status  New            Plan - 09/29/19 0850    Personal  Factors and Comorbidities  Comorbidity 3+    Comorbidities  PMH:  Bell's palsy, HTN, knee pain, PD, esophageal stricture (stretched 08/2019), R chondromalacia patella, R knee OA, GERD    Examination-Activity Limitations  Locomotion Level;Transfers    Examination-Participation Restrictions  Community Activity;Other   Exercise   Stability/Clinical Decision Making  Stable/Uncomplicated    Rehab Potential  Good    PT Frequency  Other (comment)   1x/wk (eval), then 2x/wk for 8 weeks   PT Duration  Other (comment)   Plus eval visit; total POC = 9 weeks   PT Treatment/Interventions  ADLs/Self Care Home Management;Neuromuscular re-education;Balance training;Therapeutic exercise;Therapeutic activities;Functional mobility training;Gait training;Patient/family education    PT Next Visit Plan  Continue to work on aerobic activity (SciFit/NuStep), gait with reciprocal arm swing/intensity/resisted gait, postural strengthening and stretching; PWR! Moves in standing (may need to be modified due to knee pain)    Consulted and Agree with Plan of Care  Patient       Patient will benefit from skilled therapeutic intervention in order to improve the following deficits and impairments:  Abnormal gait, Decreased coordination, Difficulty walking, Pain, Decreased balance, Improper body mechanics, Postural dysfunction, Decreased strength, Decreased mobility  Visit Diagnosis: Abnormal posture  Other abnormalities of gait and mobility  Other symptoms and signs involving the nervous system     Problem List Patient Active Problem List   Diagnosis Date Noted  . Acquired hammer toe of left foot 03/10/2019  . Right lumbar radiculopathy 12/27/2017  . Gastroparesis 07/06/2017  . Orthostatic dizziness 05/12/2016  . Low back pain 05/12/2016  . Chronic constipation 12/17/2015  . Chronic insomnia 12/17/2015  . Left hip pain 12/16/2015  . Parkinson's disease (HCC) 09/11/2015  . HYPERTENSION 11/21/2008  . MURMUR  11/21/2008  . CHEST PAIN, ATYPICAL 11/21/2008    Sallyanne Kuster, PTA, [facility] [address], [address] [place], [place] [address] [phone] 09/29/19, 9:38 PM   Name: Robertt Buda MRN: [id number] Date of Birth: 02-14-1971

## 2019-09-29 NOTE — Telephone Encounter (Signed)
I'm not in the office that day so if he wants to be seen on that day, it will be a virtual visit

## 2019-09-29 NOTE — Patient Instructions (Signed)
Coordination Exercises  Perform the following exercises for 20 minutes 1 times per day. Perform with both hand(s). Perform using big movements.   Flipping Cards: Place deck of cards on the table. Flip cards over by opening your hand big to grasp and then turn your palm up big.  Deal cards: Hold 1/2 or whole deck in your hand. Use thumb to push card off top of deck with one big push.  Pick up coins and stack one at a time: Pick up with big, intentional movements. Do not drag coin to the edge. (5-10 in a stack)  Pick up 5-10 coins one at a time and hold in palm. Then, move coins from palm to fingertips one at time and place in coin bank/container. 

## 2019-09-29 NOTE — Telephone Encounter (Signed)
Can he have an in person visit or must it remain virtual

## 2019-09-29 NOTE — Patient Instructions (Signed)
Stand in a doorway with your back against the doorframe: Reach up to the top of the doorframe with both arms.  Then bring the hand back toward over your head while still reaching up till you feel a good stretch.  Hold this for 5 seconds. Bring both arms back down.  Repeat for 10 reps. Do this 1-2 times a day.

## 2019-10-03 ENCOUNTER — Ambulatory Visit: Payer: 59 | Admitting: Occupational Therapy

## 2019-10-03 ENCOUNTER — Ambulatory Visit: Payer: 59 | Attending: Physician Assistant | Admitting: Physical Therapy

## 2019-10-03 ENCOUNTER — Ambulatory Visit: Payer: 59 | Admitting: Speech Pathology

## 2019-10-03 ENCOUNTER — Encounter: Payer: Self-pay | Admitting: Neurology

## 2019-10-03 ENCOUNTER — Telehealth: Payer: Self-pay | Admitting: Neurology

## 2019-10-03 ENCOUNTER — Other Ambulatory Visit: Payer: Self-pay

## 2019-10-03 DIAGNOSIS — R41841 Cognitive communication deficit: Secondary | ICD-10-CM | POA: Diagnosis present

## 2019-10-03 DIAGNOSIS — R2689 Other abnormalities of gait and mobility: Secondary | ICD-10-CM | POA: Insufficient documentation

## 2019-10-03 DIAGNOSIS — R471 Dysarthria and anarthria: Secondary | ICD-10-CM | POA: Diagnosis present

## 2019-10-03 DIAGNOSIS — R41844 Frontal lobe and executive function deficit: Secondary | ICD-10-CM

## 2019-10-03 DIAGNOSIS — R278 Other lack of coordination: Secondary | ICD-10-CM | POA: Diagnosis present

## 2019-10-03 DIAGNOSIS — R29898 Other symptoms and signs involving the musculoskeletal system: Secondary | ICD-10-CM | POA: Diagnosis present

## 2019-10-03 DIAGNOSIS — R293 Abnormal posture: Secondary | ICD-10-CM

## 2019-10-03 DIAGNOSIS — R41840 Attention and concentration deficit: Secondary | ICD-10-CM | POA: Diagnosis present

## 2019-10-03 DIAGNOSIS — R29818 Other symptoms and signs involving the nervous system: Secondary | ICD-10-CM | POA: Insufficient documentation

## 2019-10-03 DIAGNOSIS — R1312 Dysphagia, oropharyngeal phase: Secondary | ICD-10-CM | POA: Insufficient documentation

## 2019-10-03 NOTE — Progress Notes (Deleted)
Virtual Visit via Video Note The purpose of this virtual visit is to provide medical care while limiting exposure to the novel coronavirus.    Consent was obtained for video visit:  {yes no:314532} Answered questions that patient had about telehealth interaction:  {yes no:314532} I discussed the limitations, risks, security and privacy concerns of performing an evaluation and management service by telemedicine. I also discussed with the patient that there may be a patient responsible charge related to this service. The patient expressed understanding and agreed to proceed.  Pt location: Home Physician Location: office Name of referring provider:  Juanda Chance I connected with Ruben Silva at patients initiation/request on 10/04/2019 at  9:15 AM EST by video enabled telemedicine application and verified that I am speaking with the correct person using two identifiers. Pt MRN:  [id number] Pt DOB:  [birth year] Video Participants:  Ruben Silva;  ***   History of Present Illness: ***   Current movement d/o meds:  ***   Current Outpatient Medications on File Prior to Visit  Medication Sig Dispense Refill  . AMITIZA 24 MCG capsule Take 24 mcg by mouth daily as needed for constipation.    . carbidopa-levodopa-entacapone (STALEVO) 50-200-200 MG tablet TAKE 1 TABLET BY MOUTH 5 TIMES A DAY/EVERY 3 HOURS. 150 tablet 11  . clonazePAM (KLONOPIN) 1 MG tablet Take 1 tablet (1 mg total) by mouth at bedtime. Please call [phone] to schedule yearly appt. 30 tablet 5  . diclofenac (VOLTAREN) 75 MG EC tablet Take 75 mg by mouth 2 (two) times daily.    Marland Kitchen losartan (COZAAR) 50 MG tablet TAKE ONE TABLET (50 MG DOSE) BY MOUTH 2 (TWO) TIMES DAILY. FOR BLOOD PRESSURE    . naproxen (NAPROSYN) 500 MG tablet Take by mouth.    . propranolol (INDERAL) 40 MG tablet TAKE 2 TABLETS TWICE A DAY 120 tablet 5  . rotigotine (NEUPRO) 6 MG/24HR Place 1 patch onto the skin daily. 30 patch 5  .  sildenafil (REVATIO) 20 MG tablet Take 1 tablet (20 mg total) by mouth 3 (three) times daily. 10 tablet 0  . tadalafil (CIALIS) 20 MG tablet TAKE 1 TABLET DAILY AS NEEDED FOR ERECTILE DYSFUNCTION     No current facility-administered medications on file prior to visit.     Observations/Objective:   There were no vitals filed for this visit. GEN:  The patient appears stated age and is in NAD.  Neurological examination:  Orientation: The patient is alert and oriented x3. Cranial nerves: There is good facial symmetry. There is ***facial hypomimia.  The speech is fluent and clear. Soft palate rises symmetrically and there is no tongue deviation. Hearing is intact to conversational tone. Motor: Strength is at least antigravity x 4.   Shoulder shrug is equal and symmetric.  There is no pronator drift.  Movement examination: Tone: unable Abnormal movements: *** Coordination:  There is *** decremation with RAM's, *** Gait and Station: The patient has *** difficulty arising out of a deep-seated chair without the use of the hands. The patient's stride length is ***.      Assessment and Plan:     Follow Up Instructions:    -I discussed the assessment and treatment plan with the patient. The patient was provided an opportunity to ask questions and all were answered. The patient agreed with the plan and demonstrated an understanding of the instructions.   The patient was advised to call back or seek an in-person evaluation if the symptoms  worsen or if the condition fails to improve as anticipated.    Total time spent on today's visit was ***minutes, including both face-to-face time and nonface-to-face time.  Time included that spent on review of records (prior notes available to me/labs/imaging if pertinent), discussing treatment and goals, answering patient's questions and coordinating care.   Kerin Salen, DO

## 2019-10-03 NOTE — Therapy (Signed)
[facility] [address] [place], [place], [address] Phone: [phone]   Fax:  [phone]  Occupational Therapy Treatment  Patient Details  Name: Anyelo Mccue MRN: [id number] Date of Birth: 06-17-1971 Referring Provider (OT): Dr. Carles Collet   Encounter Date: 10/03/2019  OT End of Session - 10/03/19 0911    Visit Number  3    Number of Visits  16    Date for OT Re-Evaluation  11/14/19    Authorization Type  [employer] -24 visits OT    Authorization - Visit Number  3    Authorization - Number of Visits  24    OT Start Time  8299   pt on phone   OT Stop Time  0928    OT Time Calculation (min)  31 min    Activity Tolerance  Patient tolerated treatment well    Behavior During Therapy  [facility] for tasks assessed/performed       Past Medical History:  Diagnosis Date  . Bell's palsy   . Constipation   . Erectile dysfunction   . History of stomach ulcers   . Hypertension   . Insomnia   . Knee pain   . Murmur   . Parkinson disease ([place])   . Parkinsons ([place])   . Weakness generalized     Past Surgical History:  Procedure Laterality Date  . NO PAST SURGERIES      There were no vitals filed for this visit.  Subjective Assessment - 10/03/19 0918    Subjective   Pt reports that Dr. Carles Collet cx his appointment tomorrow   Currently in Pain?  No/denies          Treatment: Flipping and dealing cards with RUE and LUE, min v.c for larger amplitude movements. Copying small peg design with right and left UE's  For fine motor coordination with a cognitive component, min v.c for complete design Pt remains distracted b cancellation of appointment with Dr. Carles Collet. Therapist recommends pt reconnects ewith his counselor.                  OT Education - 10/03/19 [phone]    Education Details  PWR! moves modified quadraped 10-20 reps, min v.c and demonstration    Person(s) Educated  Patient    Methods   Explanation;Demonstration;Verbal cues;Handout    Comprehension  Verbalized understanding;Returned demonstration;Verbal cues required       OT Short Term Goals - 09/15/19 1153      OT SHORT TERM GOAL #1   Title  I with PD specific HEP    Time  4    Period  Weeks    Status  New    Target Date  10/15/19      OT SHORT TERM GOAL #2   Title  I with adapted strategies for ADLS/ IADLS in order to maximize safety and independence    Time  4    Period  Weeks    Status  New      OT SHORT TERM GOAL #3   Title  Pt will demonstrate improved LUE fine motor coordination for ADLs as evidenced by decreasing 9 hole peg test score to 30 secs or less.    Baseline  RUE 20.96 LUE 33.28,    Time  4    Period  Weeks    Status  New      OT SHORT TERM GOAL #4   Title  Pt will verbalize understanding of compensatory strategies for  memory/ cognitive deficits and ways to keep thinking skills sharp.    Time  4    Period  Weeks    Status  New        OT Long Term Goals - 09/15/19 1156      OT LONG TERM GOAL #1   Title  Pt will verbalize understanding of ways to prevent future PD related complications.-11/14/19    Time  8    Period  Weeks    Status  New    Target Date  11/14/19      OT LONG TERM GOAL #2   Title  Pt will demonstrate increased fine motor coordination for ADLs as evidenced by decreasing 3 button/ unbutton test to 43 secs or less.    Baseline  48 secs    Time  8    Period  Weeks    Status  New      OT LONG TERM GOAL #3   Title  Pt will demonstrate ability to retrieve a lightweight object from overhead shelf with -15 elbow  extension fo RUE.    Baseline  RUE -20(Pt reports injuring elbow), LUE -5    Time  8    Period  Weeks    Status  New      OT LONG TERM GOAL #4   Title  Pt will perform all basic ADLs modified indpendently.    Baseline  requires occasional min A form son with pants and shirt    Time  8    Period  Weeks    Status  New            Plan - 10/03/19 0920     Clinical Impression Statement  Pt reports he saw another neurologist last week, however she was a general neurologist and Dr. Arbutus Leas cx his appointment for tomorrow. Pt is progressing towards goals.    OT Occupational Profile and History  Detailed Assessment- Review of Records and additional review of physical, cognitive, psychosocial history related to current functional performance    Occupational performance deficits (Please refer to evaluation for details):  ADL's;IADL's;Rest and Sleep;Work;Leisure;Social Participation;Play    Body Structure / Function / Physical Skills  ADL;Endurance;UE functional use;Balance;Flexibility;Pain;FMC;Vision;Gait;ROM;GMC;Coordination;Decreased knowledge of precautions;IADL;Decreased knowledge of use of DME;Dexterity;Strength;Mobility;Tone    Cognitive Skills  Attention;Emotional;Energy/Drive;Memory;Perception;Problem Solve;Safety Awareness;Sequencing;Thought;Understand    Rehab Potential  Good    Clinical Decision Making  Limited treatment options, no task modification necessary    Comorbidities Affecting Occupational Performance:  May have comorbidities impacting occupational performance    Modification or Assistance to Complete Evaluation   No modification of tasks or assist necessary to complete eval    OT Frequency  2x / week    OT Duration  8 weeks    OT Treatment/Interventions  Self-care/ADL training;Energy conservation;Aquatic Therapy;DME and/or AE instruction;Ultrasound;Patient/family education;Passive range of motion;Balance training;Cryotherapy;Fluidtherapy;Splinting;Building services engineer;Therapeutic activities;Manual Therapy;Therapeutic exercise;Moist Heat;Neuromuscular education;Cognitive remediation/compensation;Visual/perceptual remediation/compensation    Plan  ADL strategies, donn / doff jacket,work towards goals    Consulted and Agree with Plan of Care  Patient       Patient will benefit from skilled therapeutic intervention in order to  improve the following deficits and impairments:   Body Structure / Function / Physical Skills: ADL, Endurance, UE functional use, Balance, Flexibility, Pain, FMC, Vision, Gait, ROM, GMC, Coordination, Decreased knowledge of precautions, IADL, Decreased knowledge of use of DME, Dexterity, Strength, Mobility, Tone Cognitive Skills: Attention, Emotional, Energy/Drive, Memory, Perception, Problem Solve, Safety Awareness, Sequencing, Thought, Understand  Visit Diagnosis: Other symptoms and signs involving the nervous system  Other lack of coordination  Frontal lobe and executive function deficit  Attention and concentration deficit  Other symptoms and signs involving the musculoskeletal system  Abnormal posture  Other abnormalities of gait and mobility    Problem List Patient Active Problem List   Diagnosis Date Noted  . Acquired hammer toe of left foot 03/10/2019  . Right lumbar radiculopathy 12/27/2017  . Gastroparesis 07/06/2017  . Orthostatic dizziness 05/12/2016  . Low back pain 05/12/2016  . Chronic constipation 12/17/2015  . Chronic insomnia 12/17/2015  . Left hip pain 12/16/2015  . Parkinson's disease (HCC) 09/11/2015  . HYPERTENSION 11/21/2008  . MURMUR 11/21/2008  . CHEST PAIN, ATYPICAL 11/21/2008    [place] 10/03/2019, 9:29 AM Keene Breath, OTR/L [phone] Phone: [phone] 1:41 PM 10/03/19 [facility] [address] [place], [place], [address] Phone: [phone]   Fax:  [phone]  Name: Helio Lack MRN: [id number] Date of Birth: [birth year]

## 2019-10-03 NOTE — Therapy (Signed)
[facility] [address] [place], [place], [address] Phone: [phone]   Fax:  [phone]  Speech Language Pathology Treatment  Patient Details  Name: Ruben Silva MRN: [id number] Date of Birth: 06-21-1971 Referring Provider (SLP): Kerin Salen, DO   Encounter Date: 10/03/2019  End of Session - 10/03/19 1302    Visit Number  3    Number of Visits  17    Date for SLP Re-Evaluation  11/24/19    Authorization - Visit Number  3    Authorization - Number of Visits  20    SLP Start Time  0930    SLP Stop Time   1015    SLP Time Calculation (min)  45 min    Activity Tolerance  Patient tolerated treatment well       Past Medical History:  Diagnosis Date  . Bell's palsy   . Constipation   . Erectile dysfunction   . History of stomach ulcers   . Hypertension   . Insomnia   . Knee pain   . Murmur   . Parkinson disease (HCC)   . Parkinsons (HCC)   . Weakness generalized     Past Surgical History:  Procedure Laterality Date  . NO PAST SURGERIES      There were no vitals filed for this visit.  Subjective Assessment - 10/03/19 0935    Subjective  "I'm having issues."    Currently in Pain?  No/denies            ADULT SLP TREATMENT - 10/03/19 1056      General Information   Behavior/Cognition  Alert;Cooperative;Other (comment)   anxious     Treatment Provided   Treatment provided  Cognitive-Linquistic      Pain Assessment   Pain Assessment  No/denies pain      Cognitive-Linquistic Treatment   Treatment focused on  Cognition;Patient/family/caregiver education;Dysarthria    Skilled Treatment  Patient greeted SLP with sub WNL vocal intensity; expressing frustration that his upcoming appointment with neurologist had been canceled. (cognitive tx 30 min)SLP targeted attention/memory compensations by having pt create a checklist of tasks/steps he needs to take for another neurologist referral.  Patient took call from Dr. Don Perking office and used compensations for recall during the call (wrote down phone number and instructions for obtaining his medical records). SLP encouraged pt to follow up with his counselor due to anxiety noted this session. Pt agreed he would do this and wrote himself a reminder. (speech tx 15 min): Loud /a/ averaged >90 dB; pt required moderate cues for carryover to increase vocal intensity and effort level in phrase level and short conversational responses.       Assessment / Recommendations / Plan   Plan  Continue with current plan of care      Progression Toward Goals   Progression toward goals  Progressing toward goals       SLP Education - 10/03/19 1301    Education Details  pt will need to use compensatory strategies to manage cognitive difficulties    Person(s) Educated  Patient    Methods  Explanation    Comprehension  Verbalized understanding       SLP Short Term Goals - 10/03/19 0959      SLP SHORT TERM GOAL #1   Title  Pt will generate loud /a/ or extended "hey!" with average of low 90s dB over 4 sessions    Baseline  10/03/19    Time  3  Period  Weeks    Status  On-going      SLP SHORT TERM GOAL #2   Title  Pt will use speech volume average low 70sdB when responding with sentences 18/20 over 3 sessions    Time  3    Period  Weeks    Status  On-going      SLP SHORT TERM GOAL #3   Title  pt will use abdominal breathing at rest 70% of the time over two sessions    Time  3    Period  Weeks    Status  On-going      SLP SHORT TERM GOAL #4   Title  in 5-7 minutes simple conversation pt will generate speech volume of low 70s over 3 sessions    Time  3    Period  Weeks    Status  On-going      SLP SHORT TERM GOAL #5   Title  pt will complete cognitive linguistic testing in the first 1-2 sessions    Baseline  CLQT 09/26/19    Time  2    Period  --   sessions   Status  Achieved      SLP SHORT TERM GOAL #6   Title  Patient will use at  least one compensatory strategy for memory or attention in 2 therapy sessions.    Time  3    Period  Weeks    Status  On-going      SLP SHORT TERM GOAL #7   Title  Patient will ID when impulsivity negatively impacts his performance in cognitive tasks in 75% of opportunities x 2 sessions.    Time  3    Period  Weeks    Status  On-going       SLP Long Term Goals - 10/03/19 1303      SLP LONG TERM GOAL #1   Title  Pt will generate loud /a/ with average of low-mid 90s dB over 4 total sessions    Time  7    Period  Weeks   or 17 total sessions; for all LTGs   Status  On-going      SLP LONG TERM GOAL #2   Title  pt will use abdominal breathing 65% of the time in 8 minutes conversation over 3 sessions    Time  7    Period  Weeks    Status  On-going      SLP LONG TERM GOAL #3   Title  in 8 minutes mod complex conversation pt will generate speech volume of low 70s with nonverbal cues, over 3 sessions    Time  7    Period  Weeks    Status  On-going      SLP LONG TERM GOAL #4   Title  pt will follow swallow precautions with POs in 3 sessions with modified independence    Time  7    Period  Weeks    Status  On-going      SLP LONG TERM GOAL #5   Title  Patient will report use of memory compensation system to recall/manage tasks at work outside of ST between 4 sessions.    Time  7    Period  Weeks    Status  New      SLP LONG TERM GOAL #6   Title  Pt will use slow rate and double checking to decrease errors in functional tasks (95% accuracy with double checking).  Time  7    Period  Weeks    Status  New       Plan - 10/03/19 1303    Clinical Impression Statement  Patient presents with decreased vocal volume (average mid 60s dB at 30 cm at evaluation). Cognitive assessment with overall scores (CLQT) measuring WNL, however functional deficits noted including recall, attention/ attention to detail, impulsivity, and executive function skills. Pt would benefit from skilled ST  targeting incr'd speech volume, adherence to aspiration precations, and improving pt's cognitive communication skills.    Speech Therapy Frequency  2x / week    Duration  --   8 weeks or 17 total sessions; pt allowed 24 ST/year so may end early to conserve visits   Treatment/Interventions  Aspiration precaution training;Pharyngeal strengthening exercises;Diet toleration management by SLP;Internal/external aids;Patient/family education;Cognitive reorganization;Compensatory strategies;SLP instruction and feedback;Functional tasks;Environmental controls;Oral motor exercises    Potential to Achieve Goals  Good    Potential Considerations  Ability to learn/carryover information;Severity of impairments    Consulted and Agree with Plan of Care  Patient       Patient will benefit from skilled therapeutic intervention in order to improve the following deficits and impairments:   Cognitive communication deficit  Dysarthria and anarthria    Problem List Patient Active Problem List   Diagnosis Date Noted  . Acquired hammer toe of left foot 03/10/2019  . Right lumbar radiculopathy 12/27/2017  . Gastroparesis 07/06/2017  . Orthostatic dizziness 05/12/2016  . Low back pain 05/12/2016  . Chronic constipation 12/17/2015  . Chronic insomnia 12/17/2015  . Left hip pain 12/16/2015  . Parkinson's disease ([place]) 09/11/2015  . HYPERTENSION 11/21/2008  . MURMUR 11/21/2008  . CHEST PAIN, ATYPICAL 11/21/2008   Deneise Lever, [place], [place] 10/03/2019, 1:05 PM  [place] [address] [place], [place], [address] Phone: [phone]   Fax:  [phone]   Name: Garcia Dalzell MRN: [id number] Date of Birth: Oct 07, 1970

## 2019-10-03 NOTE — Patient Instructions (Addendum)
    Pt instructed to perform 1 time/day, 10-20 reps

## 2019-10-03 NOTE — Telephone Encounter (Signed)
Patient dismissed from [facility] by Dr. Lurena Joiner Tat  effective 10/03/19. Dismissal letter sent out by 1st class mail 10/04/19 fbg

## 2019-10-03 NOTE — Therapy (Signed)
[facility] [address] [place], [place], [address] Phone: [phone]   Fax:  [phone]  Physical Therapy Treatment  Patient Details  Name: Ruben Silva MRN: [id number] Date of Birth: 04-25-1971 Referring Provider (PT): Tat, Lurena Joiner   Encounter Date: 10/03/2019  PT End of Session - 10/03/19 2013    Visit Number  5    Number of Visits  18    Authorization Type  [employer] 24 PT, 24 OT, 24 ST    Authorization - Visit Number  5    Authorization - Number of Visits  24    PT Start Time  0804    PT Stop Time  0844    PT Time Calculation (min)  40 min    Activity Tolerance  Patient tolerated treatment well    Behavior During Therapy  [facility] for tasks assessed/performed       Past Medical History:  Diagnosis Date  . Bell's palsy   . Constipation   . Erectile dysfunction   . History of stomach ulcers   . Hypertension   . Insomnia   . Knee pain   . Murmur   . Parkinson disease (HCC)   . Parkinsons (HCC)   . Weakness generalized     Past Surgical History:  Procedure Laterality Date  . NO PAST SURGERIES      There were no vitals filed for this visit.  Subjective Assessment - 10/03/19 0808    Subjective  Having some knee pain this morning, worse when I woke up.    Diagnostic tests  MRI from 2019 shows L3-4, L4-5 disc bulge, no nerve involvement    Patient Stated Goals  Pt's goal for physical therapy:  to get back to where I was, to avoid the Parkinson's from advancing.    Currently in Pain?  Yes    Pain Score  8     Pain Location  Knee    Pain Orientation  Right    Pain Descriptors / Indicators  Aching    Pain Type  Chronic pain    Pain Onset  More than a month ago    Pain Frequency  Constant    Aggravating Factors   worse upon waking up today    Pain Relieving Factors  moving through the day                       [facility] Adult PT Treatment/Exercise - 10/03/19 3614      Ambulation/Gait   Ambulation/Gait  Yes    Ambulation/Gait Assistance  5: Supervision;6: Modified independent (Device/Increase time)    Ambulation/Gait Assistance Details  Treadmill, 2.5>3>3.5 mph, BUE support x 5 minutes, 0.25 miles.  Added cognitive tasks while maintaing speed and foot clearance.  Occasional cues for foot clearance.    Ambulation Distance (Feet)  230 Feet   200   Assistive device  None    Gait Pattern  Step-through pattern;Decreased arm swing - right;Decreased arm swing - left;Decreased step length - left;Decreased step length - right;Decreased trunk rotation;Trunk flexed;Narrow base of support    Ambulation Surface  Level;Indoor    Gait Comments  Pt initiates gait with forward posture, decreased foot clearance, decreased heel strike.  PT cues patient to stop, reset posture and start with bigger effort for improved foot clearance.  Added stop, starts, quick changes of directions, including forward/reverse, close supervision.      Knee/Hip Exercises: Aerobic   Stepper  SciFit, Seated Stepper,  Level 2.5>3, 4 extremities, RPM >80-90, with intervals >100-110, for aerobic warm up, flexibility , x 6 minutes Pt rates effort level as 9-10/10      Pt performs PWR! Moves in standing position    PWR! Up for improved posture x 20 reps  PWR! Rock for improved weighshifting x 10 reps each side  PWR! Twist for improved trunk rotation x 10 reps each side  PWR! Step for improved step initiation x 10 reps each side (cues to point R foot forward, as he c/o R knee pain with externally rotating RLE).   Also performed Forward step and weightshift x 5 reps each side Also performed Back step and weigthshift x 5 reps each side (at counter for support), cues for technique  Cues provided for beginning 1-3 reps with slowed pace for stretch, then additional reps at quicker pace, large intensity for improved overall amplitude of movement.  With stepping exercises, pt needs cues on optimal step length  for improved balance, also needs cues for foot clearance (not to slide foot back into place).  Reviewed doorframe reaching stretch given last visit; pt performs 5 reps, returns demo understanding.         PT Education - 10/03/19 2012    Education Details  Added standing PWR! Moves to HEP-see instructions    Person(s) Educated  Patient    Methods  Explanation;Demonstration;Verbal cues;Handout    Comprehension  Verbalized understanding;Returned demonstration;Verbal cues required;Need further instruction       PT Short Term Goals - 09/13/19 2027      PT SHORT TERM GOAL #1   Title  Patient will be independent with HEP to address strength, balance, posture.  TARGET 5 weeks:  10/13/2019    Time  5    Period  Weeks    Status  New    Target Date  10/13/19      PT SHORT TERM GOAL #2   Title  Pt will improve 5x sit<>stand to less than or equal to 10 seconds for improved functional lower extremity strength.    Time  5    Period  Weeks    Status  New    Target Date  10/13/19      PT SHORT TERM GOAL #3   Title  Pt will verbalize at least 3 means to decrease low back pain, for overall improved funcitonal mobility.    Time  5    Period  Weeks    Status  New    Target Date  10/13/19      PT SHORT TERM GOAL #4   Title  Pt will improve TUG/TUG manual score to less than or equal to 10% difference for improved dual tasking with gait.    Time  5    Period  Weeks    Status  New    Target Date  10/13/19        PT Long Term Goals - 09/13/19 2033      PT LONG TERM GOAL #1   Title  Patient will be independent with updated HEP and able to verbalize a plan for appropriate community-based exercise upon discharge from PT. TARGET 11/10/2019    Time  9    Period  Weeks    Status  New      PT LONG TERM GOAL #2   Title  Pt will improve gait velocity to at least 2.62 ft/sec for improved gait efficiency and safety.    Time  5  Period  Weeks      PT LONG TERM GOAL #3   Title  Pt will  improve to at least 385 ft for improved gait efficiency and safety.    Time  5    Period  Weeks    Status  New      PT LONG TERM GOAL #4   Title  Pt will verbalize understanding of posture and body mechanics for decreased back/neck pain, improved participation with functional/work activities.    Time  8    Period  Weeks    Status  New            Plan - 10/03/19 2013    Clinical Impression Statement  Pt requires cues throughout session for attention to intensity of movement patterns, especially with transition movements, initiation of gait (as he tends to have forward posture and decreased foot clearance initiating gait).  With cues, he is able to improve gait pattern.  Pt's knee bothers slightly with standing PWR! Moves, but is improved with slight changes in foot placement; no c/o knee pain at end of session.  He will continue to benefit from skilled PT to address strength, balance, gait for improved functional mobility, decreased fall risk.    Personal Factors and Comorbidities  Comorbidity 3+    Comorbidities  PMH:  Bell's palsy, HTN, knee pain, PD, esophageal stricture (stretched 08/2019), R chondromalacia patella, R knee OA, GERD    Examination-Activity Limitations  Locomotion Level;Transfers    Examination-Participation Restrictions  Community Activity;Other   Exercise   Stability/Clinical Decision Making  Stable/Uncomplicated    Rehab Potential  Good    PT Frequency  Other (comment)   1x/wk (eval), then 2x/wk for 8 weeks   PT Duration  Other (comment)   Plus eval visit; total POC = 9 weeks   PT Treatment/Interventions  ADLs/Self Care Home Management;Neuromuscular re-education;Balance training;Therapeutic exercise;Therapeutic activities;Functional mobility training;Gait training;Patient/family education    PT Next Visit Plan  Continue to work on aerobic activity (SciFit/NuStep), gait with reciprocal arm swing/intensity/resisted gait, postural strengthening and stretching;  Review PWR! Moves in standing (may need to be modified due to knee pain)    Consulted and Agree with Plan of Care  Patient       Patient will benefit from skilled therapeutic intervention in order to improve the following deficits and impairments:  Abnormal gait, Decreased coordination, Difficulty walking, Pain, Decreased balance, Improper body mechanics, Postural dysfunction, Decreased strength, Decreased mobility  Visit Diagnosis: Other abnormalities of gait and mobility  Abnormal posture     Problem List Patient Active Problem List   Diagnosis Date Noted  . Acquired hammer toe of left foot 03/10/2019  . Right lumbar radiculopathy 12/27/2017  . Gastroparesis 07/06/2017  . Orthostatic dizziness 05/12/2016  . Low back pain 05/12/2016  . Chronic constipation 12/17/2015  . Chronic insomnia 12/17/2015  . Left hip pain 12/16/2015  . Parkinson's disease (HCC) 09/11/2015  . HYPERTENSION 11/21/2008  . MURMUR 11/21/2008  . CHEST PAIN, ATYPICAL 11/21/2008    Weslee Prestage W. 10/03/2019, 8:18 PM Gean Maidens., PT   [facility] [address] [place], [place], [address] Phone: [phone]   Fax:  [phone]  Name: Ruben Silva MRN: [id number] Date of Birth: 1971/08/21

## 2019-10-04 ENCOUNTER — Telehealth: Payer: 59 | Admitting: Neurology

## 2019-10-06 ENCOUNTER — Ambulatory Visit: Payer: 59

## 2019-10-06 ENCOUNTER — Ambulatory Visit: Payer: 59 | Admitting: Occupational Therapy

## 2019-10-10 ENCOUNTER — Ambulatory Visit: Payer: 59

## 2019-10-10 ENCOUNTER — Ambulatory Visit: Payer: 59 | Admitting: Occupational Therapy

## 2019-10-10 ENCOUNTER — Ambulatory Visit: Payer: 59 | Admitting: Physical Therapy

## 2019-10-10 ENCOUNTER — Encounter: Payer: 59 | Admitting: Occupational Therapy

## 2019-10-13 ENCOUNTER — Ambulatory Visit: Payer: 59 | Admitting: Occupational Therapy

## 2019-10-13 ENCOUNTER — Ambulatory Visit: Payer: 59

## 2019-10-13 ENCOUNTER — Other Ambulatory Visit: Payer: Self-pay

## 2019-10-13 DIAGNOSIS — R29898 Other symptoms and signs involving the musculoskeletal system: Secondary | ICD-10-CM

## 2019-10-13 DIAGNOSIS — R41840 Attention and concentration deficit: Secondary | ICD-10-CM

## 2019-10-13 DIAGNOSIS — R2689 Other abnormalities of gait and mobility: Secondary | ICD-10-CM | POA: Diagnosis not present

## 2019-10-13 DIAGNOSIS — R41841 Cognitive communication deficit: Secondary | ICD-10-CM

## 2019-10-13 DIAGNOSIS — R41844 Frontal lobe and executive function deficit: Secondary | ICD-10-CM

## 2019-10-13 DIAGNOSIS — R471 Dysarthria and anarthria: Secondary | ICD-10-CM

## 2019-10-13 DIAGNOSIS — R278 Other lack of coordination: Secondary | ICD-10-CM

## 2019-10-13 DIAGNOSIS — R29818 Other symptoms and signs involving the nervous system: Secondary | ICD-10-CM

## 2019-10-13 NOTE — Therapy (Signed)
[facility] [address] [place], [place], [address] Phone: [phone]   Fax:  [phone]  Speech Language Pathology Treatment  Patient Details  Name: Ruben Silva MRN: [id number] Date of Birth: [birth year] Referring Provider (SLP): Ruben Salen, DO   Encounter Date: 10/13/2019  End of Session - 10/13/19 1103    Visit Number  4    Number of Visits  17    Date for SLP Re-Evaluation  11/24/19    Authorization - Visit Number  3    Authorization - Number of Visits  20    SLP Start Time  0850    SLP Stop Time   0930    SLP Time Calculation (min)  40 min    Activity Tolerance  Patient tolerated treatment well       Past Medical History:  Diagnosis Date  . Bell's palsy   . Constipation   . Erectile dysfunction   . History of stomach ulcers   . Hypertension   . Insomnia   . Knee pain   . Murmur   . Parkinson disease (HCC)   . Parkinsons (HCC)   . Weakness generalized     Past Surgical History:  Procedure Laterality Date  . NO PAST SURGERIES      There were no vitals filed for this visit.  Subjective Assessment - 10/13/19 0851    Subjective  Pt stated he could do late Wednesdy and early Friday for therapies.    Currently in Pain?  No/denies            ADULT SLP TREATMENT - 10/13/19 0857      General Information   Behavior/Cognition  Alert;Cooperative;Pleasant mood      Treatment Provided   Treatment provided  Cognitive-Linquistic      Cognitive-Linquistic Treatment   Treatment focused on  Dysarthria    Skilled Treatment  Pt entered with sub-normal volume in 5 minutes simple conversation. SLP had pt produce loud /a/ and pt's productions were in mid 90s dB. "I'm gonna get you that 100, Ruben Silva." SLP told pt to, instead of hitting 100 dB, focus on consistency of loudness (maintaining volume in mid 90s dB) between his productions. After Loud /a/ pt spoke spontaneous sentences with WNL volume  75% of the time with rare mod A. Pt began talking about work and req'd usual cues for increasing volume over the upper 60s dB.       Assessment / Recommendations / Plan   Plan  Continue with current plan of care      Progression Toward Goals   Progression toward goals  Progressing toward goals         SLP Short Term Goals - 10/13/19 1103      SLP SHORT TERM GOAL #1   Title  Pt will generate loud /a/ or extended "hey!" with average of low 90s dB over 4 sessions    Baseline  10/03/19    Time  3    Period  Weeks    Status  On-going      SLP SHORT TERM GOAL #2   Title  Pt will use speech volume average low 70sdB when responding with sentences 18/20 over 3 sessions    Time  3    Period  Weeks    Status  On-going      SLP SHORT TERM GOAL #3   Title  pt will use abdominal breathing at rest 70% of the time over two sessions  Time  3    Period  Weeks    Status  On-going      SLP SHORT TERM GOAL #4   Title  in 5-7 minutes simple conversation pt will generate speech volume of low 70s over 3 sessions    Time  3    Period  Weeks    Status  On-going      SLP SHORT TERM GOAL #5   Title  pt will complete cognitive linguistic testing in the first 1-2 sessions    Baseline  CLQT 09/26/19    Time  2    Period  --   sessions   Status  Achieved      SLP SHORT TERM GOAL #6   Title  Patient will use at least one compensatory strategy for memory or attention in 2 therapy sessions.    Time  3    Period  Weeks    Status  On-going      SLP SHORT TERM GOAL #7   Title  Patient will ID when impulsivity negatively impacts his performance in cognitive tasks in 75% of opportunities x 2 sessions.    Time  3    Period  Weeks    Status  On-going       SLP Long Term Goals - 10/13/19 1103      SLP LONG TERM GOAL #1   Title  Pt will generate loud /a/ with average of low-mid 90s dB over 4 total sessions    Time  7    Period  Weeks   or 17 total sessions; for all LTGs   Status  On-going       SLP LONG TERM GOAL #2   Title  pt will use abdominal breathing 65% of the time in 8 minutes conversation over 3 sessions    Time  7    Period  Weeks    Status  On-going      SLP LONG TERM GOAL #3   Title  in 8 minutes mod complex conversation pt will generate speech volume of low 70s with nonverbal cues, over 3 sessions    Time  7    Period  Weeks    Status  On-going      SLP LONG TERM GOAL #4   Title  pt will follow swallow precautions with POs in 3 sessions with modified independence    Time  7    Period  Weeks    Status  On-going      SLP LONG TERM GOAL #5   Title  Patient will report use of memory compensation system to recall/manage tasks at work outside of Ruben Silva between 4 sessions.    Time  7    Period  Weeks    Status  New      SLP LONG TERM GOAL #6   Title  Pt will use slow rate and double checking to decrease errors in functional tasks (95% accuracy with double checking).    Time  7    Period  Weeks    Status  New       Plan - 10/13/19 1103    Clinical Impression Statement  Patient presents with decreased vocal volume (average mid 60s dB at 30 cm at evaluation). Cognitive assessment with overall scores (CLQT) measuring WNL, however functional deficits noted including recall, attention/ attention to detail, impulsivity, and executive function skills. Pt would benefit from skilled ST targeting incr'd speech volume, adherence to aspiration precations, and  improving pt's cognitive communication skills.    Speech Therapy Frequency  2x / week    Duration  --   8 weeks or 17 total sessions; pt allowed 24 ST/year so may end early to conserve visits   Treatment/Interventions  Aspiration precaution training;Pharyngeal strengthening exercises;Diet toleration management by SLP;Internal/external aids;Patient/family education;Cognitive reorganization;Compensatory strategies;SLP instruction and feedback;Functional tasks;Environmental controls;Oral motor exercises    Potential to  Achieve Goals  Good    Potential Considerations  Ability to learn/carryover information;Severity of impairments    Consulted and Agree with Plan of Care  Patient       Patient will benefit from skilled therapeutic intervention in order to improve the following deficits and impairments:   Dysarthria and anarthria  Cognitive communication deficit    Problem List Patient Active Problem List   Diagnosis Date Noted  . Acquired hammer toe of left foot 03/10/2019  . Right lumbar radiculopathy 12/27/2017  . Gastroparesis 07/06/2017  . Orthostatic dizziness 05/12/2016  . Low back pain 05/12/2016  . Chronic constipation 12/17/2015  . Chronic insomnia 12/17/2015  . Left hip pain 12/16/2015  . Parkinson's disease (HCC) 09/11/2015  . HYPERTENSION 11/21/2008  . MURMUR 11/21/2008  . CHEST PAIN, ATYPICAL 11/21/2008    [facility] ,MS, CCC-SLP  10/13/2019, 11:04 AM  [facility] [address] [place], [place], [address] Phone: [phone]   Fax:  [phone]   Name: Ruben Silva MRN: [id number] Date of Birth: [birth year]

## 2019-10-13 NOTE — Therapy (Signed)
[facility] [address] [place], [place], [address] Phone: [phone]   Fax:  [phone]  Occupational Therapy Treatment  Patient Details  Name: Ruben Silva MRN: [id number] Date of Birth: [birth year] Referring Provider (OT): Dr. Carles Collet   Encounter Date: 10/13/2019  OT End of Session - 10/13/19 1551    Visit Number  4    Number of Visits  16    Date for OT Re-Evaluation  11/14/19    Authorization Type  U[employer] -24 visits OT    Authorization - Visit Number  4    Authorization - Number of Visits  24    OT Start Time  0804    OT Stop Time  0845    OT Time Calculation (min)  41 min    Activity Tolerance  Patient tolerated treatment well    Behavior During Therapy  [facility] for tasks assessed/performed       Past Medical History:  Diagnosis Date  . Bell's palsy   . Constipation   . Erectile dysfunction   . History of stomach ulcers   . Hypertension   . Insomnia   . Knee pain   . Murmur   . Parkinson disease ([place])   . Parkinsons ([place])   . Weakness generalized     Past Surgical History:  Procedure Laterality Date  . NO PAST SURGERIES      There were no vitals filed for this visit.  Subjective Assessment - 10/13/19 0808    Subjective   Pt reports he has started taking his meds first thing and it is helping.    Currently in Pain?  Yes    Pain Score  3     Pain Orientation  Left    Pain Descriptors / Indicators  Aching    Pain Type  Chronic pain    Pain Onset  More than a month ago    Pain Frequency  Intermittent    Aggravating Factors   standing    Pain Relieving Factors  rest               Treatment:PWR! modified quadraped basic 4 x 10-20 reps each, min vc. PWR! Hands for PWR! Up and step Handwriting activities with pt demonstrating significant improvements after cueing for letter size and to rest elbow on table, pt used foam grip. Ambulating while tossing a scarf, and performing cognitive  task, min-mod v.c              OT Short Term Goals - 09/15/19 1153      OT SHORT TERM GOAL #1   Title  I with PD specific HEP    Time  4    Period  Weeks    Status  New    Target Date  10/15/19      OT SHORT TERM GOAL #2   Title  I with adapted strategies for ADLS/ IADLS in order to maximize safety and independence    Time  4    Period  Weeks    Status  New      OT SHORT TERM GOAL #3   Title  Pt will demonstrate improved LUE fine motor coordination for ADLs as evidenced by decreasing 9 hole peg test score to 30 secs or less.    Baseline  RUE 20.96 LUE 33.28,    Time  4    Period  Weeks    Status  New      OT SHORT TERM  GOAL #4   Title  Pt will verbalize understanding of compensatory strategies for memory/ cognitive deficits and ways to keep thinking skills sharp.    Time  4    Period  Weeks    Status  New        OT Long Term Goals - 09/15/19 1156      OT LONG TERM GOAL #1   Title  Pt will verbalize understanding of ways to prevent future PD related complications.-11/14/19    Time  8    Period  Weeks    Status  New    Target Date  11/14/19      OT LONG TERM GOAL #2   Title  Pt will demonstrate increased fine motor coordination for ADLs as evidenced by decreasing 3 button/ unbutton test to 43 secs or less.    Baseline  48 secs    Time  8    Period  Weeks    Status  New      OT LONG TERM GOAL #3   Title  Pt will demonstrate ability to retrieve a lightweight object from overhead shelf with -15 elbow  extension fo RUE.    Baseline  RUE -20(Pt reports injuring elbow), LUE -5    Time  8    Period  Weeks    Status  New      OT LONG TERM GOAL #4   Title  Pt will perform all basic ADLs modified indpendently.    Baseline  requires occasional min A form son with pants and shirt    Time  8    Period  Weeks    Status  New            Plan - 10/13/19 1551    Clinical Impression Statement  Pt reports that he is taking his medications first thing before  getting ready for the day and it is helping.    OT Occupational Profile and History  Detailed Assessment- Review of Records and additional review of physical, cognitive, psychosocial history related to current functional performance    Occupational performance deficits (Please refer to evaluation for details):  ADL's;IADL's;Rest and Sleep;Work;Leisure;Social Participation;Play    Body Structure / Function / Physical Skills  ADL;Endurance;UE functional use;Balance;Flexibility;Pain;FMC;Vision;Gait;ROM;GMC;Coordination;Decreased knowledge of precautions;IADL;Decreased knowledge of use of DME;Dexterity;Strength;Mobility;Tone    Cognitive Skills  Attention;Emotional;Energy/Drive;Memory;Perception;Problem Solve;Safety Awareness;Sequencing;Thought;Understand    Rehab Potential  Good    Clinical Decision Making  Limited treatment options, no task modification necessary    Comorbidities Affecting Occupational Performance:  May have comorbidities impacting occupational performance    Modification or Assistance to Complete Evaluation   No modification of tasks or assist necessary to complete eval    OT Frequency  2x / week    OT Duration  8 weeks    OT Treatment/Interventions  Self-care/ADL training;Energy conservation;Aquatic Therapy;DME and/or AE instruction;Ultrasound;Patient/family education;Passive range of motion;Balance training;Cryotherapy;Fluidtherapy;Splinting;Building services engineer;Therapeutic activities;Manual Therapy;Therapeutic exercise;Moist Heat;Neuromuscular education;Cognitive remediation/compensation;Visual/perceptual remediation/compensation    Plan  continue ADL strategies, work towards goals    Consulted and Agree with Plan of Care  Patient       Patient will benefit from skilled therapeutic intervention in order to improve the following deficits and impairments:   Body Structure / Function / Physical Skills: ADL, Endurance, UE functional use, Balance, Flexibility, Pain, FMC,  Vision, Gait, ROM, GMC, Coordination, Decreased knowledge of precautions, IADL, Decreased knowledge of use of DME, Dexterity, Strength, Mobility, Tone Cognitive Skills: Attention, Emotional, Energy/Drive, Memory, Perception, Problem Solve, Safety Awareness, Sequencing,  Thought, Understand     Visit Diagnosis: Other lack of coordination  Frontal lobe and executive function deficit  Attention and concentration deficit  Other symptoms and signs involving the musculoskeletal system  Other symptoms and signs involving the nervous system    Problem List Patient Active Problem List   Diagnosis Date Noted  . Acquired hammer toe of left foot 03/10/2019  . Right lumbar radiculopathy 12/27/2017  . Gastroparesis 07/06/2017  . Orthostatic dizziness 05/12/2016  . Low back pain 05/12/2016  . Chronic constipation 12/17/2015  . Chronic insomnia 12/17/2015  . Left hip pain 12/16/2015  . Parkinson's disease ([facility]) 09/11/2015  . HYPERTENSION 11/21/2008  . MURMUR 11/21/2008  . CHEST PAIN, ATYPICAL 11/21/2008    Ruben Silva 10/13/2019, 3:53 PM  [facility] [address] [place], [place], [address] Phone: [phone]   Fax:  [phone]  Name: Ruben Silva MRN: [id number] Date of Birth: 02-Jan-1971

## 2019-10-17 ENCOUNTER — Encounter: Payer: Self-pay | Admitting: Physical Therapy

## 2019-10-17 ENCOUNTER — Ambulatory Visit: Payer: 59 | Admitting: Occupational Therapy

## 2019-10-17 ENCOUNTER — Ambulatory Visit: Payer: 59 | Admitting: Speech Pathology

## 2019-10-17 ENCOUNTER — Encounter: Payer: Self-pay | Admitting: Occupational Therapy

## 2019-10-17 ENCOUNTER — Ambulatory Visit: Payer: 59 | Admitting: Physical Therapy

## 2019-10-17 ENCOUNTER — Other Ambulatory Visit: Payer: Self-pay

## 2019-10-17 DIAGNOSIS — R278 Other lack of coordination: Secondary | ICD-10-CM

## 2019-10-17 DIAGNOSIS — R2689 Other abnormalities of gait and mobility: Secondary | ICD-10-CM

## 2019-10-17 DIAGNOSIS — R29818 Other symptoms and signs involving the nervous system: Secondary | ICD-10-CM

## 2019-10-17 DIAGNOSIS — R293 Abnormal posture: Secondary | ICD-10-CM

## 2019-10-17 DIAGNOSIS — R29898 Other symptoms and signs involving the musculoskeletal system: Secondary | ICD-10-CM

## 2019-10-17 DIAGNOSIS — R41844 Frontal lobe and executive function deficit: Secondary | ICD-10-CM

## 2019-10-17 DIAGNOSIS — R41840 Attention and concentration deficit: Secondary | ICD-10-CM

## 2019-10-17 NOTE — Therapy (Signed)
[facility] [address] [place], [place], [address] Phone: [phone]   Fax:  [phone]  Physical Therapy Treatment  Patient Details  Name: Ruben Silva MRN: [id number] Date of Birth: 1971-03-28 Referring Provider (PT): Tat, Wells Guiles   Encounter Date: 10/17/2019  PT End of Session - 10/17/19 1447    Visit Number  6    Number of Visits  18    Authorization Type  UHC-VL 24 PT, 24 OT, 24 ST    Authorization - Visit Number  6    Authorization - Number of Visits  24    PT Start Time  0848    PT Stop Time  0929    PT Time Calculation (min)  41 min    Activity Tolerance  Patient tolerated treatment well    Behavior During Therapy  [facility] for tasks assessed/performed       Past Medical History:  Diagnosis Date  . Bell's palsy   . Constipation   . Erectile dysfunction   . History of stomach ulcers   . Hypertension   . Insomnia   . Knee pain   . Murmur   . Parkinson disease ([place])   . Parkinsons ([place])   . Weakness generalized     Past Surgical History:  Procedure Laterality Date  . NO PAST SURGERIES      There were no vitals filed for this visit.  Subjective Assessment - 10/17/19 0838    Subjective  Having some pain in the back of my knee.  No falls, no stumbles.  Think I might try to get back to the gym today.    Diagnostic tests  MRI from 2019 shows L3-4, L4-5 disc bulge, no nerve involvement    Patient Stated Goals  Pt's goal for physical therapy:  to get back to where I was, to avoid the Parkinson's from advancing.    Currently in Pain?  Yes    Pain Score  6     Pain Location  Knee    Pain Orientation  Right    Pain Descriptors / Indicators  Tightness    Pain Type  Acute pain    Pain Onset  1 to 4 weeks ago    Pain Frequency  Constant    Aggravating Factors   not sure    Pain Relieving Factors  not sure                       [facility] Adult PT Treatment/Exercise - 10/17/19  9833      Transfers   Transfers  Sit to Stand;Stand to Sit    Sit to Stand  6: Modified independent (Device/Increase time);From chair/3-in-1;Without upper extremity assist    Five time sit to stand comments   6.31    Stand to Sit  6: Modified independent (Device/Increase time);Without upper extremity assist;To chair/3-in-1      Ambulation/Gait   Ambulation/Gait  Yes    Ambulation/Gait Assistance  6: Modified independent (Device/Increase time)    Ambulation/Gait Assistance Details  Gait training with added cognitive task (naming foods A-Z), with noted slowed gait pattern    Ambulation Distance (Feet)  720 Feet    Assistive device  None    Gait Pattern  Step-through pattern;Decreased arm swing - right;Decreased arm swing - left;Decreased step length - left;Decreased step length - right;Decreased trunk rotation;Trunk flexed;Narrow base of support    Ambulation Surface  Level;Indoor      Standardized Balance  Assessment   Standardized Balance Assessment  Timed Up and Go Test      Timed Up and Go Test   TUG  Normal TUG;Manual TUG;Cognitive TUG    Normal TUG (seconds)  7.75    Manual TUG (seconds)  7.94    Cognitive TUG (seconds)  9.9   2nd trial:  8.10 sec     Self-Care   Self-Care  Other Self-Care Comments    Other Self-Care Comments   Discussed progress towards goals, importance of consistent attendance in therapy sessions (pt reports he has changed PT schedule to better fit around work schedule).  Discussed concerns about returning to gym (as pt reports wanting to get back onto back machines)-discussed given pt's hx of back pain as well as need for frequent cues for posture with PT exercises-he will need to have guidance/supervision from gym staff to safely perform back machine exercises to avoid injury.      Checked BP this visit, as pt reports he is having continued headaches at night, unsure if BP related.  BP today 107/68.  Discussed checking BP regularly and keeping a long to  discuss with physician.  Also discussed signs/symptoms of HTN, including severe headaches. Therapeutic Exercise: Review of pt's exercises in HEP:  Pt performs PWR! Moves in seated, then standing position x 10 reps each   PWR! Up for improved posture  PWR! Rock for improved weighshifting  PWR! Twist for improved trunk rotation -cues for technique  PWR! Step for improved step initiation   Pt return demo understanding, with mod cues to keep head/neck in neutral posture and to focus eyes on target with exercises.  Pt also performs seated shoulder rolls, neck retraction, and pelvic tilts x 10 reps each in sitting, as part of HEP-cues for correct technique and neutral head/neck/low back posture.           PT Short Term Goals - 10/17/19 1452      PT SHORT TERM GOAL #1   Title  Patient will be independent with HEP to address strength, balance, posture.  TARGET 5 weeks:  10/13/2019    Baseline  Pt reports performing at home, but needs cues for technique. 10/17/2019    Time  5    Period  Weeks    Status  Partially Met    Target Date  10/13/19      PT SHORT TERM GOAL #2   Title  Pt will improve 5x sit<>stand to less than or equal to 10 seconds for improved functional lower extremity strength.    Time  5    Period  Weeks    Status  Achieved    Target Date  10/13/19      PT SHORT TERM GOAL #3   Title  Pt will verbalize at least 3 means to decrease low back pain, for overall improved funcitonal mobility.    Time  5    Period  Weeks    Status  Achieved    Target Date  10/13/19      PT SHORT TERM GOAL #4   Title  Pt will improve TUG/TUG manual score to less than or equal to 10% difference for improved dual tasking with gait.    Time  5    Period  Weeks    Status  Achieved    Target Date  10/13/19        PT Long Term Goals - 10/17/19 1456      PT LONG TERM GOAL #1  Title  Patient will be independent with updated HEP and able to verbalize a plan for appropriate  community-based exercise upon discharge from PT. TARGET 11/10/2019    Time  9    Period  Weeks    Status  New      PT LONG TERM GOAL #2   Title  Pt will improve gait velocity to at least 2.62 ft/sec for improved gait efficiency and safety.    Time  9    Period  Weeks      PT LONG TERM GOAL #3   Title  Pt will improve 3MWT to at least 385 ft for improved gait efficiency and safety.    Time  9    Period  Weeks    Status  New      PT LONG TERM GOAL #4   Title  Pt will verbalize understanding of posture and body mechanics for decreased back/neck pain, improved participation with functional/work activities.    Time  9    Period  Weeks    Status  New            Plan - 10/17/19 1453    Clinical Impression Statement  Pt returns to clinic today, after not being seen for PT since 10/03/2019 due to work schedule conflicts.  Assessed STGs this visit, with pt partially meeting STG 1 for HEP (performing at home, but needs cues); he has met STGS 2-4.  He appears to have excess movements in head/neck and trunk with exercises (unsure if this is related to dystonia), able to correct with cues for technique of exercises.  He will continue to benefit from skilled PT to address strength, balance,a nd gait for improved functional mobility, decreased falls.    Personal Factors and Comorbidities  Comorbidity 3+    Comorbidities  PMH:  Bell's palsy, HTN, knee pain, PD, esophageal stricture (stretched 08/2019), R chondromalacia patella, R knee OA, GERD    Examination-Activity Limitations  Locomotion Level;Transfers    Examination-Participation Restrictions  Community Activity;Other   Exercise   Stability/Clinical Decision Making  Stable/Uncomplicated    Rehab Potential  Good    PT Frequency  Other (comment)   1x/wk (eval), then 2x/wk for 8 weeks   PT Duration  Other (comment)   Plus eval visit; total POC = 9 weeks   PT Treatment/Interventions  ADLs/Self Care Home Management;Neuromuscular  re-education;Balance training;Therapeutic exercise;Therapeutic activities;Functional mobility training;Gait training;Patient/family education    PT Next Visit Plan  Treadmill gait and on-ground gait with cognitive tasks; resisted gait, postural strenghtening and stretching; work towards [employer] and Agree with Plan of Care  Patient       Patient will benefit from skilled therapeutic intervention in order to improve the following deficits and impairments:  Abnormal gait, Decreased coordination, Difficulty walking, Pain, Decreased balance, Improper body mechanics, Postural dysfunction, Decreased strength, Decreased mobility  Visit Diagnosis: Other symptoms and signs involving the nervous system  Abnormal posture  Other abnormalities of gait and mobility     Problem List Patient Active Problem List   Diagnosis Date Noted  . Acquired hammer toe of left foot 03/10/2019  . Right lumbar radiculopathy 12/27/2017  . Gastroparesis 07/06/2017  . Orthostatic dizziness 05/12/2016  . Low back pain 05/12/2016  . Chronic constipation 12/17/2015  . Chronic insomnia 12/17/2015  . Left hip pain 12/16/2015  . Parkinson's disease ([place]) 09/11/2015  . HYPERTENSION 11/21/2008  . MURMUR 11/21/2008  . CHEST PAIN, ATYPICAL 11/21/2008    Keanen Dohse  W. 10/17/2019, 2:58 PM  Frazier Butt., PT   [facility] [address] [place], [place], [address] Phone: [phone]   Fax:  [phone]  Name: Goro Wenrick MRN: [id number] Date of Birth: 09-Nov-1970

## 2019-10-17 NOTE — Therapy (Addendum)
[facility] [address] [place], [place], [address] Phone: [phone]   Fax:  [phone]  Occupational Therapy Treatment  Patient Details  Name: Ruben Silva MRN: [id number] Date of Birth: 10-Jun-1971 Referring Provider (OT): Dr. Arbutus Leas   Encounter Date: 10/17/2019  OT End of Session - 10/17/19 0807    Visit Number  5    Number of Visits  16    Date for OT Re-Evaluation  11/14/19    Authorization Type  [employer] -24 visits OT    Authorization - Visit Number  5    Authorization - Number of Visits  24    OT Start Time  0804    OT Stop Time  0845    OT Time Calculation (min)  41 min    Activity Tolerance  Patient tolerated treatment well    Behavior During Therapy  [facility] for tasks assessed/performed       Past Medical History:  Diagnosis Date  . Bell's palsy   . Constipation   . Erectile dysfunction   . History of stomach ulcers   . Hypertension   . Insomnia   . Knee pain   . Murmur   . Parkinson disease (HCC)   . Parkinsons (HCC)   . Weakness generalized     Past Surgical History:  Procedure Laterality Date  . NO PAST SURGERIES      There were no vitals filed for this visit.  Subjective Assessment - 10/17/19 0805    Subjective   Pt reports behind R thigh feels tight.  Pt reports that he was on time today due to taking medication earlier    Currently in Pain?  Yes    Pain Score  6     Pain Location  Leg    Pain Orientation  Right    Pain Descriptors / Indicators  Tightness    Pain Type  Acute pain    Pain Onset  In the past 7 days    Pain Frequency  Constant    Aggravating Factors   nothing    Pain Relieving Factors  nothing        Pulling scarf into each hand with min cueing for large amplitude movements with each hand to simulate clothing adjustment.  Donning/doffing shoes/socks with min cueing for large amplitude movement strategies/after instruction.  Donning/doffing shirt with min  cueing for large amplitude movement strategies/after instruction.  Standing, functional reaching lateral and overhead with each UE with focus on wt. Shift, trunk rotation, and large amplitude movements with reach/grasp.  Min cueing for deliberate movements, but not fast.  Standing, functional reaching to retrieve items from floor with large amplitude movement strategies (feet apart/staggered) with min cueing for timing of movement.  Practiced writing:  Pt able to copy 5 sentences with good legibility, min decr in size at ends of the lines.  Recommended that pt slide paper or pick up arm to slide over as he gets to the end of the line.  Pt verbalized understanding.   Then pt wrote a "work update" to simulate writing for work with min decr in legibility.  Recommended pt think about what he wants to write prior to starting writing to decr dual task and improve legibility.         OT Short Term Goals - 09/15/19 1153      OT SHORT TERM GOAL #1   Title  I with PD specific HEP    Time  4  Period  Weeks    Status  New    Target Date  10/15/19      OT SHORT TERM GOAL #2   Title  I with adapted strategies for ADLS/ IADLS in order to maximize safety and independence    Time  4    Period  Weeks    Status  New      OT SHORT TERM GOAL #3   Title  Pt will demonstrate improved LUE fine motor coordination for ADLs as evidenced by decreasing 9 hole peg test score to 30 secs or less.    Baseline  RUE 20.96 LUE 33.28,    Time  4    Period  Weeks    Status  New      OT SHORT TERM GOAL #4   Title  Pt will verbalize understanding of compensatory strategies for memory/ cognitive deficits and ways to keep thinking skills sharp.    Time  4    Period  Weeks    Status  New        OT Long Term Goals - 09/15/19 1156      OT LONG TERM GOAL #1   Title  Pt will verbalize understanding of ways to prevent future PD related complications.-11/14/19    Time  8    Period  Weeks    Status  New     Target Date  11/14/19      OT LONG TERM GOAL #2   Title  Pt will demonstrate increased fine motor coordination for ADLs as evidenced by decreasing 3 button/ unbutton test to 43 secs or less.    Baseline  48 secs    Time  8    Period  Weeks    Status  New      OT LONG TERM GOAL #3   Title  Pt will demonstrate ability to retrieve a lightweight object from overhead shelf with -15 elbow  extension fo RUE.    Baseline  RUE -20(Pt reports injuring elbow), LUE -5    Time  8    Period  Weeks    Status  New      OT LONG TERM GOAL #4   Title  Pt will perform all basic ADLs modified indpendently.    Baseline  requires occasional min A form son with pants and shirt    Time  8    Period  Weeks    Status  New            Plan - 10/17/19 9622    Clinical Impression Statement  Pt reports that he is taking his medications first thing before getting ready for the day and it is helping.  Pt needs cueing for timing and control of large amplitude movements.    OT Occupational Profile and History  Detailed Assessment- Review of Records and additional review of physical, cognitive, psychosocial history related to current functional performance    Occupational performance deficits (Please refer to evaluation for details):  ADL's;IADL's;Rest and Sleep;Work;Leisure;Social Participation;Play    Body Structure / Function / Physical Skills  ADL;Endurance;UE functional use;Balance;Flexibility;Pain;FMC;Vision;Gait;ROM;GMC;Coordination;Decreased knowledge of precautions;IADL;Decreased knowledge of use of DME;Dexterity;Strength;Mobility;Tone    Cognitive Skills  Attention;Emotional;Energy/Drive;Memory;Perception;Problem Solve;Safety Awareness;Sequencing;Thought;Understand    Rehab Potential  Good    Clinical Decision Making  Limited treatment options, no task modification necessary    Comorbidities Affecting Occupational Performance:  May have comorbidities impacting occupational performance    Modification or  Assistance to Complete Evaluation   No modification  of tasks or assist necessary to complete eval    OT Frequency  2x / week    OT Duration  8 weeks    OT Treatment/Interventions  Self-care/ADL training;Energy conservation;Aquatic Therapy;DME and/or AE instruction;Ultrasound;Patient/family education;Passive range of motion;Balance training;Cryotherapy;Fluidtherapy;Splinting;Therapist, nutritional;Therapeutic activities;Manual Therapy;Therapeutic exercise;Moist Heat;Neuromuscular education;Cognitive remediation/compensation;Visual/perceptual remediation/compensation    Plan  continue ADL strategies, work towards goals    Consulted and Agree with Plan of Care  Patient       Patient will benefit from skilled therapeutic intervention in order to improve the following deficits and impairments:   Body Structure / Function / Physical Skills: ADL, Endurance, UE functional use, Balance, Flexibility, Pain, FMC, Vision, Gait, ROM, GMC, Coordination, Decreased knowledge of precautions, IADL, Decreased knowledge of use of DME, Dexterity, Strength, Mobility, Tone Cognitive Skills: Attention, Emotional, Energy/Drive, Memory, Perception, Problem Solve, Safety Awareness, Sequencing, Thought, Understand     Visit Diagnosis: Other lack of coordination  Frontal lobe and executive function deficit  Attention and concentration deficit  Other symptoms and signs involving the musculoskeletal system  Other symptoms and signs involving the nervous system  Abnormal posture  Other abnormalities of gait and mobility    Problem List Patient Active Problem List   Diagnosis Date Noted  . Acquired hammer toe of left foot 03/10/2019  . Right lumbar radiculopathy 12/27/2017  . Gastroparesis 07/06/2017  . Orthostatic dizziness 05/12/2016  . Low back pain 05/12/2016  . Chronic constipation 12/17/2015  . Chronic insomnia 12/17/2015  . Left hip pain 12/16/2015  . Parkinson's disease ([place]) 09/11/2015  .  HYPERTENSION 11/21/2008  . MURMUR 11/21/2008  . CHEST PAIN, ATYPICAL 11/21/2008    [facility] 10/17/2019, 8:59 AM  [facility] [address] [place], [place], [address] Phone: [phone]   Fax:  [phone]  Name: Ruben Silva MRN: [id number] Date of Birth: Apr 24, 1971   Vianne Bulls, OTR/L [facility] [address] [place], [place]  [address] [phone] phone [phone] 10/17/19 8:59 AM

## 2019-10-19 ENCOUNTER — Ambulatory Visit: Payer: 59 | Admitting: Speech Pathology

## 2019-10-20 ENCOUNTER — Ambulatory Visit: Payer: 59 | Admitting: Occupational Therapy

## 2019-10-20 ENCOUNTER — Other Ambulatory Visit: Payer: Self-pay

## 2019-10-20 ENCOUNTER — Ambulatory Visit: Payer: 59 | Admitting: Physical Therapy

## 2019-10-20 ENCOUNTER — Ambulatory Visit: Payer: 59

## 2019-10-20 ENCOUNTER — Encounter: Payer: Self-pay | Admitting: Occupational Therapy

## 2019-10-20 DIAGNOSIS — R29818 Other symptoms and signs involving the nervous system: Secondary | ICD-10-CM

## 2019-10-20 DIAGNOSIS — R2689 Other abnormalities of gait and mobility: Secondary | ICD-10-CM | POA: Diagnosis not present

## 2019-10-20 DIAGNOSIS — R471 Dysarthria and anarthria: Secondary | ICD-10-CM

## 2019-10-20 DIAGNOSIS — R293 Abnormal posture: Secondary | ICD-10-CM

## 2019-10-20 DIAGNOSIS — R41841 Cognitive communication deficit: Secondary | ICD-10-CM

## 2019-10-20 DIAGNOSIS — R278 Other lack of coordination: Secondary | ICD-10-CM

## 2019-10-20 DIAGNOSIS — R29898 Other symptoms and signs involving the musculoskeletal system: Secondary | ICD-10-CM

## 2019-10-20 DIAGNOSIS — R1312 Dysphagia, oropharyngeal phase: Secondary | ICD-10-CM

## 2019-10-20 DIAGNOSIS — R41840 Attention and concentration deficit: Secondary | ICD-10-CM

## 2019-10-20 DIAGNOSIS — R41844 Frontal lobe and executive function deficit: Secondary | ICD-10-CM

## 2019-10-20 NOTE — Patient Instructions (Signed)
Memory Compensation Strategies  1. Use "WARM" strategy.  W= write it down  A= associate it  R= repeat it  M= make a mental note  2.   You can keep a Glass blower/designer.  Use a 3-ring notebook with sections for the following: calendar, important names and phone numbers,  medications, doctors' names/phone numbers, lists/reminders, and a section to journal what you did  each day.   3.    Use a calendar to write appointments down.  4.    Write yourself a schedule for the day.  This can be placed on the calendar or in a separate section of the Memory Notebook.  Keeping a  regular schedule can help memory.  5.    Use medication organizer with sections for each day or morning/evening pills.  You may need help loading it  6.    Keep a basket, or pegboard by the door.  Place items that you need to take out with you in the basket or on the pegboard.  You may also want to  include a message board for reminders.  7.    Use sticky notes.  Place sticky notes with reminders in a place where the task is performed.  For example: " turn off the  stove" placed by the stove, "lock the door" placed on the door at eye level, " take your medications" on  the bathroom mirror or by the place where you normally take your medications.  8.    Use alarms/timers.  Use while cooking to remind yourself to check on food or as a reminder to take your medicine, or as a  reminder to make a call, or as a reminder to perform another task, etc.   Keeping Thinking Skills Sharp: 1. Jigsaw puzzles 2. Card/board games 3. Talking on the phone/social events 4. Lumosity.com 5. Online games 6. Word searches/crossword puzzles 7.  Logic puzzles 8. Aerobic exercise (stationary bike) 9. Eating balanced diet (fruits & veggies) 10. Drink water 11. Try something new--new recipe, hobby 12. Crafts 13. Do a variety of activities that are challenging

## 2019-10-20 NOTE — Therapy (Signed)
[facility] [address] [place], [place], [address] Phone: [phone]   Fax:  [phone]  Occupational Therapy Treatment  Patient Details  Name: Ruben Silva MRN: [id number] Date of Birth: 21-Sep-1970 Referring Provider (OT): Dr. Carles Collet   Encounter Date: 10/20/2019  OT End of Session - 10/20/19 0938    Visit Number  6    Number of Visits  16    Date for OT Re-Evaluation  11/14/19    Authorization Type  [employer] -24 visits OT    Authorization - Visit Number  6    Authorization - Number of Visits  24    OT Start Time  0933    OT Stop Time  1015    OT Time Calculation (min)  42 min    Activity Tolerance  Patient tolerated treatment well    Behavior During Therapy  [facility] for tasks assessed/performed       Past Medical History:  Diagnosis Date  . Bell's palsy   . Constipation   . Erectile dysfunction   . History of stomach ulcers   . Hypertension   . Insomnia   . Knee pain   . Murmur   . Parkinson disease ([place])   . Parkinsons ([facility])   . Weakness generalized     Past Surgical History:  Procedure Laterality Date  . NO PAST SURGERIES      There were no vitals filed for this visit.  Subjective Assessment - 10/20/19 1002    Currently in Pain?  Yes    Pain Score  4     Pain Location  Back    Pain Descriptors / Indicators  Aching    Pain Onset  1 to 4 weeks ago    Pain Frequency  Intermittent    Aggravating Factors   rigidity in leg    Pain Relieving Factors  exercises             Treatment: PWR! Hands basic 4, 5-10 reps each. Fine motor coordination activity to place grooved pegs in pegboard with right and left UE's for increased fine motor coordination, then removing with in hand manipulation, min v.c Ambulating while tossing scarf and performing cognitive task min difficulty, min loss of balance but pt was able to self recover . Therapist reveiwed fastening buttons with big movements with  patient, patient met his long term goal with improved 3 button/ unbutton score. Therapist checked 9 hole peg test score and pt met goal. Drawing scarf into hand with right and left hand to simulate pulling up socks, min v.c               OT Education - 10/20/19 0941    Education Details  memory strategies, keeping thinking skills sharp    Person(s) Educated  Patient    Methods  Explanation;Demonstration;Verbal cues;Handout    Comprehension  Verbalized understanding;Returned demonstration       OT Short Term Goals - 10/20/19 1005      OT SHORT TERM GOAL #1   Title  I with PD specific HEP    Time  4    Period  Weeks    Status  Achieved    Target Date  10/15/19      OT SHORT TERM GOAL #2   Title  I with adapted strategies for ADLS/ IADLS in order to maximize safety and independence    Time  4    Period  Weeks    Status  On-going  OT SHORT TERM GOAL #3   Title  Pt will demonstrate improved LUE fine motor coordination for ADLs as evidenced by decreasing 9 hole peg test score to 30 secs or less.    Baseline  RUE 20.96 LUE 33.28,    Time  4    Period  Weeks    Status  On-going   20.84 secs     OT SHORT TERM GOAL #4   Title  Pt will verbalize understanding of compensatory strategies for memory/ cognitive deficits and ways to keep thinking skills sharp.    Time  4    Period  Weeks    Status  On-going        OT Long Term Goals - 10/20/19 1010      OT LONG TERM GOAL #1   Title  Pt will verbalize understanding of ways to prevent future PD related complications.-11/14/19    Time  8    Period  Weeks    Status  New      OT LONG TERM GOAL #2   Title  Pt will demonstrate increased fine motor coordination for ADLs as evidenced by decreasing 3 button/ unbutton test to 43 secs or less.    Baseline  48 secs    Time  8    Period  Weeks    Status  Achieved   23.66 secs     OT LONG TERM GOAL #3   Title  Pt will demonstrate ability to retrieve a lightweight object  from overhead shelf with -15 elbow  extension fo RUE.    Baseline  RUE -20(Pt reports injuring elbow), LUE -5    Time  8    Period  Weeks    Status  New      OT LONG TERM GOAL #4   Title  Pt will perform all basic ADLs modified indpendently.    Baseline  requires occasional min A form son with pants and shirt    Time  8    Period  Weeks    Status  New            Plan - 10/20/19 9937    Clinical Impression Statement  Pt reports he is having difficulty sleeping. Therapist encouraged pt to discuss with his MD.    OT Occupational Profile and History  Detailed Assessment- Review of Records and additional review of physical, cognitive, psychosocial history related to current functional performance    Occupational performance deficits (Please refer to evaluation for details):  ADL's;IADL's;Rest and Sleep;Work;Leisure;Social Participation;Play    Body Structure / Function / Physical Skills  ADL;Endurance;UE functional use;Balance;Flexibility;Pain;FMC;Vision;Gait;ROM;GMC;Coordination;Decreased knowledge of precautions;IADL;Decreased knowledge of use of DME;Dexterity;Strength;Mobility;Tone    Cognitive Skills  Attention;Emotional;Energy/Drive;Memory;Perception;Problem Solve;Safety Awareness;Sequencing;Thought;Understand    Rehab Potential  Good    Clinical Decision Making  Limited treatment options, no task modification necessary    Comorbidities Affecting Occupational Performance:  May have comorbidities impacting occupational performance    Modification or Assistance to Complete Evaluation   No modification of tasks or assist necessary to complete eval    OT Frequency  2x / week    OT Duration  8 weeks    OT Treatment/Interventions  Self-care/ADL training;Energy conservation;Aquatic Therapy;DME and/or AE instruction;Ultrasound;Patient/family education;Passive range of motion;Balance training;Cryotherapy;Fluidtherapy;Splinting;Therapist, nutritional;Therapeutic activities;Manual  Therapy;Therapeutic exercise;Moist Heat;Neuromuscular education;Cognitive remediation/compensation;Visual/perceptual remediation/compensation    Plan  continue ADL strategies, work towards goals    Consulted and Agree with Plan of Care  Patient       Patient will benefit from  skilled therapeutic intervention in order to improve the following deficits and impairments:   Body Structure / Function / Physical Skills: ADL, Endurance, UE functional use, Balance, Flexibility, Pain, FMC, Vision, Gait, ROM, GMC, Coordination, Decreased knowledge of precautions, IADL, Decreased knowledge of use of DME, Dexterity, Strength, Mobility, Tone Cognitive Skills: Attention, Emotional, Energy/Drive, Memory, Perception, Problem Solve, Safety Awareness, Sequencing, Thought, Understand     Visit Diagnosis: Other lack of coordination  Frontal lobe and executive function deficit  Attention and concentration deficit  Other symptoms and signs involving the musculoskeletal system  Other symptoms and signs involving the nervous system  Abnormal posture    Problem List Patient Active Problem List   Diagnosis Date Noted  . Acquired hammer toe of left foot 03/10/2019  . Right lumbar radiculopathy 12/27/2017  . Gastroparesis 07/06/2017  . Orthostatic dizziness 05/12/2016  . Low back pain 05/12/2016  . Chronic constipation 12/17/2015  . Chronic insomnia 12/17/2015  . Left hip pain 12/16/2015  . Parkinson's disease ([place]) 09/11/2015  . HYPERTENSION 11/21/2008  . MURMUR 11/21/2008  . CHEST PAIN, ATYPICAL 11/21/2008    Arnez Stoneking 10/20/2019, 10:56 AM  [facility] [address] [place], [place], [address] Phone: [phone]   Fax:  [phone]  Name: Bryson Gavia MRN: [id number] Date of Birth: October 04, 1970

## 2019-10-20 NOTE — Patient Instructions (Signed)
Strategic Mindfulness for Stress Management:  Mindfulness Apps:  Insight Timer (Free app)  Headspace  Calm  (Body Scan)

## 2019-10-20 NOTE — Therapy (Signed)
[facility] [address] [place], [place], [address] Phone: [phone]   Fax:  [phone]  Physical Therapy Treatment  Patient Details  Name: Ruben Silva MRN: [id number] Date of Birth: 1971/06/25 Referring Provider (PT): Tat, Wells Guiles   Encounter Date: 10/20/2019  PT End of Session - 10/20/19 1217    Visit Number  7    Number of Visits  18    Authorization Type  [employer] 24 PT, 24 OT, 24 ST    Authorization - Visit Number  7    Authorization - Number of Visits  24    PT Start Time  0845    PT Stop Time  0927    PT Time Calculation (min)  42 min    Activity Tolerance  Patient tolerated treatment well    Behavior During Therapy  [facility] for tasks assessed/performed       Past Medical History:  Diagnosis Date  . Bell's palsy   . Constipation   . Erectile dysfunction   . History of stomach ulcers   . Hypertension   . Insomnia   . Knee pain   . Murmur   . Parkinson disease ([place])   . Parkinsons ([place])   . Weakness generalized     Past Surgical History:  Procedure Laterality Date  . NO PAST SURGERIES      There were no vitals filed for this visit.  Subjective Assessment - 10/20/19 0846    Subjective  Just cannot sleep-only had one hour of sleep last night.  Headache just won't go away.    Diagnostic tests  MRI from 2019 shows L3-4, L4-5 disc bulge, no nerve involvement    Patient Stated Goals  Pt's goal for physical therapy:  to get back to where I was, to avoid the Parkinson's from advancing.    Currently in Pain?  Yes    Pain Score  4     Pain Location  Back    Pain Orientation  Left;Lower    Pain Descriptors / Indicators  Throbbing    Pain Type  Acute pain    Pain Onset  1 to 4 weeks ago    Aggravating Factors   just there    Pain Relieving Factors  not sure                       [facility] Adult PT Treatment/Exercise - 10/20/19 0859      Ambulation/Gait   Ambulation/Gait  Yes     Ambulation/Gait Assistance  6: Modified independent (Device/Increase time)    Ambulation/Gait Assistance Details  Pt wearing rain boots today, with PT providing cues throughout gait for improved foot clearance.    Ambulation Distance (Feet)  1260 Feet   6MWT; 632 ft in 3 minutes; 120 ft x 2   Assistive device  None    Gait Pattern  Step-through pattern;Decreased arm swing - right;Decreased arm swing - left;Decreased step length - left;Decreased step length - right;Decreased trunk rotation;Trunk flexed;Narrow base of support    Ambulation Surface  Level;Indoor    Gait Comments  BP with gait:  107/66, HR 60      Self-Care   Self-Care  Other Self-Care Comments    Other Self-Care Comments   Pt continues to be concerned about lack of sleep (reports headaches and heart racing at night).  Reminded patient from last visit, about checking BP measures at night and discussing with MD if BP measures elevated.  Discussed stragetic mindfulness for stress management, as means for relaxation, good sleep hygiene.  Provided pt with information on mindfulness apps.      Exercises   Exercises  Other Exercises    Other Exercises   Standing modified PWR! Moves at counter (modified quadruped):  push up to [facility]! Up x 10 reps, then [employer] (ant/post) position x 10 reps, then [employer]! Twist x 10 reps; cues throughout for coordinating head/eyes on visual target; PWR! Step x 10 reps each side-cues for increased foot clearance      Knee/Hip Exercises: Stretches   Gastroc Stretch  Right;3 reps;30 seconds    Gastroc Stretch Limitations  Standing runner's stretch      Knee/Hip Exercises: Aerobic   Stepper  SciFit, Seated Stepper, Level 3.3>3, 4 extremities, x 6 minutes, RPM >80-90, with intervals >100-110, for aerobic warm up, flexibility, and lower extremity strength.  Cues for upright posture throughout   At times, pt uses only lower extremities            PT Education - 10/20/19 1216    Education Details   Provided information on Strategic Mindfulness as means of relaxation/sleep hygiene-see instructions    Person(s) Educated  Patient    Methods  Explanation;Handout    Comprehension  Verbalized understanding       PT Short Term Goals - 10/17/19 1452      PT SHORT TERM GOAL #1   Title  Patient will be independent with HEP to address strength, balance, posture.  TARGET 5 weeks:  10/13/2019    Baseline  Pt reports performing at home, but needs cues for technique. 10/17/2019    Time  5    Period  Weeks    Status  Partially Met    Target Date  10/13/19      PT SHORT TERM GOAL #2   Title  Pt will improve 5x sit<>stand to less than or equal to 10 seconds for improved functional lower extremity strength.    Time  5    Period  Weeks    Status  Achieved    Target Date  10/13/19      PT SHORT TERM GOAL #3   Title  Pt will verbalize at least 3 means to decrease low back pain, for overall improved funcitonal mobility.    Time  5    Period  Weeks    Status  Achieved    Target Date  10/13/19      PT SHORT TERM GOAL #4   Title  Pt will improve TUG/TUG manual score to less than or equal to 10% difference for improved dual tasking with gait.    Time  5    Period  Weeks    Status  Achieved    Target Date  10/13/19        PT Long Term Goals - 10/17/19 1456      PT LONG TERM GOAL #1   Title  Patient will be independent with updated HEP and able to verbalize a plan for appropriate community-based exercise upon discharge from PT. TARGET 11/10/2019    Time  9    Period  Weeks    Status  New      PT LONG TERM GOAL #2   Title  Pt will improve gait velocity to at least 2.62 ft/sec for improved gait efficiency and safety.    Time  9    Period  Weeks      PT LONG TERM GOAL #  3   Title  Pt will improve 3MWT to at least 385 ft for improved gait efficiency and safety.    Time  9    Period  Weeks    Status  New      PT LONG TERM GOAL #4   Title  Pt will verbalize understanding of posture and  body mechanics for decreased back/neck pain, improved participation with functional/work activities.    Time  9    Period  Weeks    Status  New            Plan - 10/20/19 1217    Clinical Impression Statement  Addressed gait training and strength/aerobic training as well as PWR! Moves modified quadruped position at counter for postural strengthening and weightshifting.  Pt continues to have dyskinesias through neck and upper trunk with gait, cues for posture throughout session.    Personal Factors and Comorbidities  Comorbidity 3+    Comorbidities  PMH:  Bell's palsy, HTN, knee pain, PD, esophageal stricture (stretched 08/2019), R chondromalacia patella, R knee OA, GERD    Examination-Activity Limitations  Locomotion Level;Transfers    Examination-Participation Restrictions  Community Activity;Other   Exercise   Stability/Clinical Decision Making  Stable/Uncomplicated    Rehab Potential  Good    PT Frequency  Other (comment)   1x/wk (eval), then 2x/wk for 8 weeks   PT Duration  Other (comment)   Plus eval visit; total POC = 9 weeks   PT Treatment/Interventions  ADLs/Self Care Home Management;Neuromuscular re-education;Balance training;Therapeutic exercise;Therapeutic activities;Functional mobility training;Gait training;Patient/family education    PT Next Visit Plan  Try treadmill gait and on-ground gait with cognitive tasks; resisted gait, postural strenghtening and stretching; work towards [employer] and Agree with Plan of Care  Patient       Patient will benefit from skilled therapeutic intervention in order to improve the following deficits and impairments:  Abnormal gait, Decreased coordination, Difficulty walking, Pain, Decreased balance, Improper body mechanics, Postural dysfunction, Decreased strength, Decreased mobility  Visit Diagnosis: Other abnormalities of gait and mobility  Abnormal posture     Problem List Patient Active Problem List   Diagnosis Date  Noted  . Acquired hammer toe of left foot 03/10/2019  . Right lumbar radiculopathy 12/27/2017  . Gastroparesis 07/06/2017  . Orthostatic dizziness 05/12/2016  . Low back pain 05/12/2016  . Chronic constipation 12/17/2015  . Chronic insomnia 12/17/2015  . Left hip pain 12/16/2015  . Parkinson's disease ([place]) 09/11/2015  . HYPERTENSION 11/21/2008  . MURMUR 11/21/2008  . CHEST PAIN, ATYPICAL 11/21/2008    Domanique Luckett W. 10/20/2019, 12:21 PM  Frazier Butt., PT  [facility] [address] [place], [place], [address] Phone: [phone]   Fax:  [phone]  Name: Ruben Silva MRN: [id number] Date of Birth: 10/13/1970

## 2019-10-20 NOTE — Therapy (Signed)
[facility] [address] [place], [place], [address] Phone: [phone]   Fax:  [phone]  Speech Language Pathology Treatment  Patient Details  Name: Ruben Silva MRN: [id number] Date of Birth: 05-15-1971 Referring Provider (SLP): Kerin Salen, DO   Encounter Date: 10/20/2019  End of Session - 10/20/19 1050    Visit Number  5    Number of Visits  17    Date for SLP Re-Evaluation  11/24/19    Authorization - Visit Number  4    Authorization - Number of Visits  20    SLP Start Time  0804    SLP Stop Time   0845    SLP Time Calculation (min)  41 min    Activity Tolerance  Patient tolerated treatment well       Past Medical History:  Diagnosis Date  . Bell's palsy   . Constipation   . Erectile dysfunction   . History of stomach ulcers   . Hypertension   . Insomnia   . Knee pain   . Murmur   . Parkinson disease ([facility])   . Parkinsons ([facility])   . Weakness generalized     Past Surgical History:  Procedure Laterality Date  . NO PAST SURGERIES      There were no vitals filed for this visit.  Subjective Assessment - 10/20/19 0812    Subjective  "I got 1 hour of sleep last night [name]." "...I start getting a headache the same time every night [name] I can't sleep."    Currently in Pain?  No/denies            ADULT SLP TREATMENT - 10/20/19 0823      General Information   Behavior/Cognition  Alert;Cooperative;Pleasant mood      Treatment Provided   Treatment provided  Cognitive-Linquistic      Pain Assessment   Pain Assessment  No/denies pain      Cognitive-Linquistic Treatment   Treatment focused on  Dysarthria    Skilled Treatment  Pt entered with sub-normal volume in simple conversation prior to loud /a/ (5 minutes). SLP had pt produce loud /a/ to habitualize louder converstional speech - pt averaged mid-upper 90s dB. In structured sentence elvel speech tasks, pt req'd min A occasionally  to feel abdominal strength/support/push to achieve WNL volume. In conversation after structured tasks, no carryover to spontaneous speech was observed.      Assessment / Recommendations / Plan   Plan  Continue with current plan of care      Progression Toward Goals   Progression toward goals  Progressing toward goals         SLP Short Term Goals - 10/20/19 1050      SLP SHORT TERM GOAL #1   Title  Pt will generate loud /a/ or extended "hey!" with average of low 90s dB over 4 sessions    Baseline  10/03/19, 10-20-19    Time  2    Period  Weeks    Status  On-going      SLP SHORT TERM GOAL #2   Title  Pt will use speech volume average low 70sdB when responding with sentences 18/20 over 3 sessions    Time  2    Period  Weeks    Status  On-going      SLP SHORT TERM GOAL #3   Title  pt will use abdominal breathing at rest 70% of the time over two sessions  Time  2    Period  Weeks    Status  On-going      SLP SHORT TERM GOAL #4   Title  in 5-7 minutes simple conversation pt will generate speech volume of low 70s over 3 sessions    Time  2    Period  Weeks    Status  On-going      SLP SHORT TERM GOAL #5   Title  pt will complete cognitive linguistic testing in the first 1-2 sessions    Baseline  CLQT 09/26/19    Status  Achieved      SLP SHORT TERM GOAL #6   Title  Patient will use at least one compensatory strategy for memory or attention in 2 therapy sessions.    Time  3    Period  Weeks    Status  On-going      SLP SHORT TERM GOAL #7   Title  Patient will ID when impulsivity negatively impacts his performance in cognitive tasks in 75% of opportunities x 2 sessions.    Time  3    Period  Weeks    Status  On-going       SLP Long Term Goals - 10/20/19 1051      SLP LONG TERM GOAL #1   Title  Pt will generate loud /a/ with average of low-mid 90s dB over 4 total sessions    Time  6    Period  Weeks   or 17 total sessions; for all LTGs   Status  On-going       SLP LONG TERM GOAL #2   Title  pt will use abdominal breathing 65% of the time in 8 minutes conversation over 3 sessions    Time  6    Period  Weeks    Status  On-going      SLP LONG TERM GOAL #3   Title  in 8 minutes mod complex conversation pt will generate speech volume of low 70s with nonverbal cues, over 3 sessions    Time  6    Period  Weeks    Status  On-going      SLP LONG TERM GOAL #4   Title  pt will follow swallow precautions with POs in 3 sessions with modified independence    Time  6    Period  Weeks    Status  On-going      SLP LONG TERM GOAL #5   Title  Patient will report use of memory compensation system to recall/manage tasks at work outside of ST between 4 sessions.    Time  6    Period  Weeks    Status  New      SLP LONG TERM GOAL #6   Title  Pt will use slow rate and double checking to decrease errors in functional tasks (95% accuracy with double checking).    Time  6    Period  Weeks    Status  New       Plan - 10/20/19 1050    Clinical Impression Statement  Patient presents with decreased vocal volume (average mid 60s dB at 30 cm at evaluation). Cognitive assessment with overall scores (CLQT) measuring WNL, however functional deficits noted including recall, attention/ attention to detail, impulsivity, and executive function skills. Pt would benefit from skilled ST targeting incr'd speech volume, adherence to aspiration precations, and improving pt's cognitive communication skills.    Speech Therapy Frequency  2x /  week    Duration  --   8 weeks or 17 total sessions; pt allowed 24 ST/year so may end early to conserve visits   Treatment/Interventions  Aspiration precaution training;Pharyngeal strengthening exercises;Diet toleration management by SLP;Internal/external aids;Patient/family education;Cognitive reorganization;Compensatory strategies;SLP instruction and feedback;Functional tasks;Environmental controls;Oral motor exercises    Potential to Achieve  Goals  Good    Potential Considerations  Ability to learn/carryover information;Severity of impairments    Consulted and Agree with Plan of Care  Patient       Patient will benefit from skilled therapeutic intervention in order to improve the following deficits and impairments:   Dysarthria and anarthria  Cognitive communication deficit  Dysphagia, oropharyngeal phase    Problem List Patient Active Problem List   Diagnosis Date Noted  . Acquired hammer toe of left foot 03/10/2019  . Right lumbar radiculopathy 12/27/2017  . Gastroparesis 07/06/2017  . Orthostatic dizziness 05/12/2016  . Low back pain 05/12/2016  . Chronic constipation 12/17/2015  . Chronic insomnia 12/17/2015  . Left hip pain 12/16/2015  . Parkinson's disease ([place]) 09/11/2015  . HYPERTENSION 11/21/2008  . MURMUR 11/21/2008  . CHEST PAIN, ATYPICAL 11/21/2008    [facility] ,MS, CCC-SLP  10/20/2019, 10:52 AM  [facility] [address] [place], [place], [address] Phone: [phone]   Fax:  [phone]   Name: Iosefa Weintraub MRN: [id number] Date of Birth: 07-01-1971

## 2019-10-25 ENCOUNTER — Other Ambulatory Visit: Payer: Self-pay | Admitting: Neurology

## 2019-11-01 ENCOUNTER — Other Ambulatory Visit: Payer: Self-pay

## 2019-11-01 ENCOUNTER — Encounter: Payer: Self-pay | Admitting: Physical Therapy

## 2019-11-01 ENCOUNTER — Ambulatory Visit: Payer: 59 | Admitting: Occupational Therapy

## 2019-11-01 ENCOUNTER — Ambulatory Visit: Payer: 59 | Attending: Physician Assistant | Admitting: Physical Therapy

## 2019-11-01 DIAGNOSIS — R29898 Other symptoms and signs involving the musculoskeletal system: Secondary | ICD-10-CM | POA: Insufficient documentation

## 2019-11-01 DIAGNOSIS — R2689 Other abnormalities of gait and mobility: Secondary | ICD-10-CM | POA: Diagnosis not present

## 2019-11-01 DIAGNOSIS — R293 Abnormal posture: Secondary | ICD-10-CM

## 2019-11-01 DIAGNOSIS — R41844 Frontal lobe and executive function deficit: Secondary | ICD-10-CM

## 2019-11-01 DIAGNOSIS — R278 Other lack of coordination: Secondary | ICD-10-CM | POA: Insufficient documentation

## 2019-11-01 DIAGNOSIS — R41841 Cognitive communication deficit: Secondary | ICD-10-CM | POA: Diagnosis present

## 2019-11-01 DIAGNOSIS — R471 Dysarthria and anarthria: Secondary | ICD-10-CM | POA: Diagnosis present

## 2019-11-01 DIAGNOSIS — R29818 Other symptoms and signs involving the nervous system: Secondary | ICD-10-CM | POA: Diagnosis present

## 2019-11-01 DIAGNOSIS — R41840 Attention and concentration deficit: Secondary | ICD-10-CM | POA: Insufficient documentation

## 2019-11-01 DIAGNOSIS — R1312 Dysphagia, oropharyngeal phase: Secondary | ICD-10-CM | POA: Diagnosis present

## 2019-11-01 NOTE — Patient Instructions (Addendum)
Access Code: [email or web address] URL: [email or web address] Date: 11/01/2019 Prepared by: Lonia Blood  Exercises Seated Shoulder Rolls - 10 reps - 1-2 sets - 1-2x daily - 5x weekly Seated Cervical Retraction - 10 reps - 1-2 sets - 1-2x daily - 5x weekly Seated Pelvic Tilt - 10 reps - 1-2 sets - 1-2x daily - 5x weekly Standing Lumbar Spine Flexion Stretch Counter - 10 reps - 3 sets - 1x daily - 7x weekly Patient Education Office Posture

## 2019-11-01 NOTE — Therapy (Signed)
[facility] [address] [place], [place], [address] Phone: [phone]   Fax:  [phone]  Physical Therapy Treatment  Patient Details  Name: Ruben Silva MRN: [id number] Date of Birth: 02-22-1971 Referring Provider (PT): Tat, Wells Guiles   Encounter Date: 11/01/2019  PT End of Session - 11/01/19 2221    Visit Number  8    Number of Visits  18    Authorization Type  UHC-VL 24 PT, 24 OT, 24 ST    Authorization - Visit Number  8    Authorization - Number of Visits  24    PT Start Time  0938    PT Stop Time  1657    PT Time Calculation (min)  38 min    Activity Tolerance  Patient tolerated treatment well    Behavior During Therapy  WFL for tasks assessed/performed       Past Medical History:  Diagnosis Date  . Bell's palsy   . Constipation   . Erectile dysfunction   . History of stomach ulcers   . Hypertension   . Insomnia   . Knee pain   . Murmur   . Parkinson disease ([place])   . Parkinsons ([place])   . Weakness generalized     Past Surgical History:  Procedure Laterality Date  . NO PAST SURGERIES      There were no vitals filed for this visit.  Subjective Assessment - 11/01/19 1622    Subjective  Doing okay today.  Just a little pain in back today, not much.    Diagnostic tests  MRI from 2019 shows L3-4, L4-5 disc bulge, no nerve involvement    Patient Stated Goals  Pt's goal for physical therapy:  to get back to where I was, to avoid the Parkinson's from advancing.    Currently in Pain?  Yes    Pain Score  2     Pain Location  Back    Pain Orientation  Lower    Pain Descriptors / Indicators  Aching    Pain Type  Acute pain    Pain Onset  1 to 4 weeks ago    Pain Frequency  Intermittent    Aggravating Factors   knee bothers back    Pain Relieving Factors  when knee not hurting, back doesnot hurt; Tylenol                       [facility] Adult PT Treatment/Exercise - 11/01/19 1626       Ambulation/Gait   Ambulation/Gait  Yes    Ambulation/Gait Assistance  6: Modified independent (Device/Increase time)    Ambulation/Gait Assistance Details  Increased forward flexed posture and decreased arm swing.  C/o pain in back after gait, relieved stretching at counter.    Ambulation Distance (Feet)  800 Feet    Assistive device  None    Gait Pattern  Step-through pattern;Decreased arm swing - right;Decreased arm swing - left;Decreased step length - left;Decreased step length - right;Decreased trunk rotation;Trunk flexed;Narrow base of support    Ambulation Surface  Level;Indoor    Gait Comments  Gait with dual tasking (cognitive-naming) activity, overall slowed pattern with naming task; PT provides cues for upright posture, foot clearance      High Level Balance   High Level Balance Comments  Dynamic gait activities:  exagerrated step/stop activity with coordinated arm swing (with pt holding [employer] to increase intensity/amplitude of movement patterns), then forward marching/opposite arm lift;  forward march with UE tap to opposite leg; then forward lunges with trunk rotation; all performed 2 sets x 25 ft; then sidestepping with coordinated UE movements with [employer], with cues for upright posture, cues for increased foot clearance,step length, 1 x 25 ft each direction.      Knee/Hip Exercises: Aerobic   Stepper  SciFit, Seated Stepper, Level 3, 4 extremities, x 6 minutes, RPM >80-90, for aerobic warm up, flexibility, and lower extremity strength.  Cues for upright posture throughout.      Knee/Hip Exercises: Machines for Strengthening   Cybex Leg Press  90#, 110# BLE 10 reps each; 60# RLE x 10 reps; LLE x 10 reps.  No c/o knee pain.  Pt does need cues for slow, controlled leg extension/return to start position.             PT Education - 11/01/19 2221    Education Details  Added lumbar/hamstring stretch to standing exercises; pt has questions about weight machines at  gym; educated on safe start/progression of leg press    Person(s) Educated  Patient    Methods  Explanation;Demonstration;Handout    Comprehension  Verbalized understanding;Returned demonstration       PT Short Term Goals - 10/17/19 1452      PT SHORT TERM GOAL #1   Title  Patient will be independent with HEP to address strength, balance, posture.  TARGET 5 weeks:  10/13/2019    Baseline  Pt reports performing at home, but needs cues for technique. 10/17/2019    Time  5    Period  Weeks    Status  Partially Met    Target Date  10/13/19      PT SHORT TERM GOAL #2   Title  Pt will improve 5x sit<>stand to less than or equal to 10 seconds for improved functional lower extremity strength.    Time  5    Period  Weeks    Status  Achieved    Target Date  10/13/19      PT SHORT TERM GOAL #3   Title  Pt will verbalize at least 3 means to decrease low back pain, for overall improved funcitonal mobility.    Time  5    Period  Weeks    Status  Achieved    Target Date  10/13/19      PT SHORT TERM GOAL #4   Title  Pt will improve TUG/TUG manual score to less than or equal to 10% difference for improved dual tasking with gait.    Time  5    Period  Weeks    Status  Achieved    Target Date  10/13/19        PT Long Term Goals - 10/17/19 1456      PT LONG TERM GOAL #1   Title  Patient will be independent with updated HEP and able to verbalize a plan for appropriate community-based exercise upon discharge from PT. TARGET 11/10/2019    Time  9    Period  Weeks    Status  New      PT LONG TERM GOAL #2   Title  Pt will improve gait velocity to at least 2.62 ft/sec for improved gait efficiency and safety.    Time  9    Period  Weeks      PT LONG TERM GOAL #3   Title  Pt will improve 3MWT to at least 385 ft for improved gait efficiency and safety.  Time  9    Period  Weeks    Status  New      PT LONG TERM GOAL #4   Title  Pt will verbalize understanding of posture and body  mechanics for decreased back/neck pain, improved participation with functional/work activities.    Time  9    Period  Weeks    Status  New            Plan - 11/01/19 2222    Clinical Impression Statement  Focus of today's skilled PT session is strength/aerobic training as well as dual tasking and dynamic gait activities.  With dual tasking with gait, pt demonstrates overall slowed pace of gait, and needs postural cues throughout.  With dynamic gait tasks, pt needs cues for posture and for increase amplitude of movement.  He will continue to benefit from skilled PT to further address balance, functional stregnth, posture, and gait for improved functional mobility.    Personal Factors and Comorbidities  Comorbidity 3+    Comorbidities  PMH:  Bell's palsy, HTN, knee pain, PD, esophageal stricture (stretched 08/2019), R chondromalacia patella, R knee OA, GERD    Examination-Activity Limitations  Locomotion Level;Transfers    Examination-Participation Restrictions  Community Activity;Other   Exercise   Stability/Clinical Decision Making  Stable/Uncomplicated    Rehab Potential  Good    PT Frequency  Other (comment)   1x/wk (eval), then 2x/wk for 8 weeks   PT Duration  Other (comment)   Plus eval visit; total POC = 9 weeks   PT Treatment/Interventions  ADLs/Self Care Home Management;Neuromuscular re-education;Balance training;Therapeutic exercise;Therapeutic activities;Functional mobility training;Gait training;Patient/family education    PT Next Visit Plan  Try treadmill gait and on-ground gait with cognitive tasks; resisted gait/dynamic gait and balance, postural strenghtening and stretching; work towards [employer] and Agree with Plan of Care  Patient       Patient will benefit from skilled therapeutic intervention in order to improve the following deficits and impairments:  Abnormal gait, Decreased coordination, Difficulty walking, Pain, Decreased balance, Improper body mechanics,  Postural dysfunction, Decreased strength, Decreased mobility  Visit Diagnosis: Other abnormalities of gait and mobility  Abnormal posture     Problem List Patient Active Problem List   Diagnosis Date Noted  . Acquired hammer toe of left foot 03/10/2019  . Right lumbar radiculopathy 12/27/2017  . Gastroparesis 07/06/2017  . Orthostatic dizziness 05/12/2016  . Low back pain 05/12/2016  . Chronic constipation 12/17/2015  . Chronic insomnia 12/17/2015  . Left hip pain 12/16/2015  . Parkinson's disease ([place]) 09/11/2015  . HYPERTENSION 11/21/2008  . MURMUR 11/21/2008  . CHEST PAIN, ATYPICAL 11/21/2008    Quy Lotts W. 11/01/2019, 10:26 PM Frazier Butt., PT  [facility] [address] [place], [place], [address] Phone: [phone]   Fax:  [phone]  Name: Ruben Silva MRN: [id number] Date of Birth: 1970-10-28

## 2019-11-01 NOTE — Therapy (Signed)
[facility] [address] [place], [place], [address] Phone: [phone]   Fax:  [phone]  Occupational Therapy Treatment  Patient Details  Name: Ruben Silva MRN: [id number] Date of Birth: 1971-07-09 Referring Provider (OT): Dr. Arbutus Leas   Encounter Date: 11/01/2019  OT End of Session - 11/01/19 1705    Visit Number  7    Number of Visits  16    Date for OT Re-Evaluation  11/14/19    Authorization Type  UHC -24 visits OT    Authorization - Visit Number  7    Authorization - Number of Visits  24    OT Start Time  1704    OT Stop Time  1745    OT Time Calculation (min)  41 min    Activity Tolerance  Patient tolerated treatment well    Behavior During Therapy  [facility] for tasks assessed/performed       Past Medical History:  Diagnosis Date  . Bell's palsy   . Constipation   . Erectile dysfunction   . History of stomach ulcers   . Hypertension   . Insomnia   . Knee pain   . Murmur   . Parkinson disease ([facility])   . Parkinsons ([facility])   . Weakness generalized     Past Surgical History:  Procedure Laterality Date  . NO PAST SURGERIES      There were no vitals filed for this visit.  Subjective Assessment - 11/01/19 1702    Currently in Pain?  Yes    Pain Score  2     Pain Location  Back    Pain Descriptors / Indicators  Aching    Pain Type  Chronic pain    Pain Onset  More than a month ago    Pain Frequency  Intermittent    Aggravating Factors   malpositioning    Pain Relieving Factors  repositioning                 Treatment: PWR! Flow in standing x 10 reps, min -mod v.c and demonstration for exercise with cognitive component. Dynamic step and reach with large playing cards min v.c Ambulating while tossing ball and performing cognitive task, 1 LOB, however pt was able to recover, min v.c for category generation. Arm bike x 5 mins level 4 for conditioning, pt maintained 40 rpm Discussed  big movements with ADLs, pt reports he is doing better, he keeps remembeirng to move big, and use bigger hand movements Grooved pegboard for increased fine motor coordination, min v.c            OT Short Term Goals - 11/01/19 1706      OT SHORT TERM GOAL #1   Title  I with PD specific HEP    Time  4    Period  Weeks    Status  Achieved    Target Date  10/15/19      OT SHORT TERM GOAL #2   Title  I with adapted strategies for ADLS/ IADLS in order to maximize safety and independence    Time  4    Period  Weeks    Status  On-going      OT SHORT TERM GOAL #3   Title  Pt will demonstrate improved LUE fine motor coordination for ADLs as evidenced by decreasing 9 hole peg test score to 30 secs or less.    Baseline  RUE 20.96 LUE 33.28,    Time  4    Period  Weeks    Status  Achieved   20.84 secs     OT SHORT TERM GOAL #4   Title  Pt will verbalize understanding of compensatory strategies for memory/ cognitive deficits and ways to keep thinking skills sharp.    Time  4    Period  Weeks    Status  Achieved        OT Long Term Goals - 11/01/19 1707      OT LONG TERM GOAL #1   Title  Pt will verbalize understanding of ways to prevent future PD related complications.-11/14/19    Time  8    Period  Weeks    Status  New      OT LONG TERM GOAL #2   Title  Pt will demonstrate increased fine motor coordination for ADLs as evidenced by decreasing 3 button/ unbutton test to 43 secs or less.    Baseline  48 secs    Time  8    Period  Weeks    Status  Achieved   23.66 secs     OT LONG TERM GOAL #3   Title  Pt will demonstrate ability to retrieve a lightweight object from overhead shelf with -15 elbow  extension fo RUE.    Baseline  RUE -20(Pt reports injuring elbow), LUE -5    Time  8    Period  Weeks    Status  On-going      OT LONG TERM GOAL #4   Title  Pt will perform all basic ADLs modified indpendently.    Baseline  requires occasional min A form son with pants  and shirt    Time  8    Period  Weeks    Status  On-going   Pt needs help approximately 1x week           Plan - 11/01/19 1717    Clinical Impression Statement  Pt is progressing towards goals with improved carryover of big movments with ADLs.    OT Occupational Profile and History  Detailed Assessment- Review of Records and additional review of physical, cognitive, psychosocial history related to current functional performance    Occupational performance deficits (Please refer to evaluation for details):  ADL's;IADL's;Rest and Sleep;Work;Leisure;Social Participation;Play    Body Structure / Function / Physical Skills  ADL;Endurance;UE functional use;Balance;Flexibility;Pain;FMC;Vision;Gait;ROM;GMC;Coordination;Decreased knowledge of precautions;IADL;Decreased knowledge of use of DME;Dexterity;Strength;Mobility;Tone    Cognitive Skills  Attention;Emotional;Energy/Drive;Memory;Perception;Problem Solve;Safety Awareness;Sequencing;Thought;Understand    Rehab Potential  Good    Clinical Decision Making  Limited treatment options, no task modification necessary    Comorbidities Affecting Occupational Performance:  May have comorbidities impacting occupational performance    Modification or Assistance to Complete Evaluation   No modification of tasks or assist necessary to complete eval    OT Frequency  2x / week    OT Duration  8 weeks    OT Treatment/Interventions  Self-care/ADL training;Energy conservation;Aquatic Therapy;DME and/or AE instruction;Ultrasound;Patient/family education;Passive range of motion;Balance training;Cryotherapy;Fluidtherapy;Splinting;Building services engineer;Therapeutic activities;Manual Therapy;Therapeutic exercise;Moist Heat;Neuromuscular education;Cognitive remediation/compensation;Visual/perceptual remediation/compensation    Plan  work towards goals, reinforce boig movments with ADLs.    Consulted and Agree with Plan of Care  Patient       Patient will  benefit from skilled therapeutic intervention in order to improve the following deficits and impairments:   Body Structure / Function / Physical Skills: ADL, Endurance, UE functional use, Balance, Flexibility, Pain, FMC, Vision, Gait, ROM, GMC, Coordination, Decreased knowledge of precautions, IADL,  Decreased knowledge of use of DME, Dexterity, Strength, Mobility, Tone Cognitive Skills: Attention, Emotional, Energy/Drive, Memory, Perception, Problem Solve, Safety Awareness, Sequencing, Thought, Understand     Visit Diagnosis: Other lack of coordination  Frontal lobe and executive function deficit  Attention and concentration deficit  Other symptoms and signs involving the musculoskeletal system  Other symptoms and signs involving the nervous system  Abnormal posture    Problem List Patient Active Problem List   Diagnosis Date Noted  . Acquired hammer toe of left foot 03/10/2019  . Right lumbar radiculopathy 12/27/2017  . Gastroparesis 07/06/2017  . Orthostatic dizziness 05/12/2016  . Low back pain 05/12/2016  . Chronic constipation 12/17/2015  . Chronic insomnia 12/17/2015  . Left hip pain 12/16/2015  . Parkinson's disease ([place]) 09/11/2015  . HYPERTENSION 11/21/2008  . MURMUR 11/21/2008  . CHEST PAIN, ATYPICAL 11/21/2008    Aaleyah Witherow 11/01/2019, 5:34 PM  [facility] [address] [place], [place], [address] Phone: [phone]   Fax:  [phone]  Name: Keahi Mccarney MRN: [id number] Date of Birth: 13-Aug-1971

## 2019-11-02 ENCOUNTER — Ambulatory Visit: Payer: 59 | Admitting: Neurology

## 2019-11-03 ENCOUNTER — Ambulatory Visit: Payer: 59 | Admitting: Occupational Therapy

## 2019-11-03 ENCOUNTER — Encounter: Payer: Self-pay | Admitting: Occupational Therapy

## 2019-11-03 ENCOUNTER — Ambulatory Visit: Payer: 59

## 2019-11-03 ENCOUNTER — Other Ambulatory Visit: Payer: Self-pay

## 2019-11-03 ENCOUNTER — Ambulatory Visit: Payer: 59 | Admitting: Physical Therapy

## 2019-11-03 DIAGNOSIS — R471 Dysarthria and anarthria: Secondary | ICD-10-CM

## 2019-11-03 DIAGNOSIS — R41841 Cognitive communication deficit: Secondary | ICD-10-CM

## 2019-11-03 DIAGNOSIS — R278 Other lack of coordination: Secondary | ICD-10-CM

## 2019-11-03 DIAGNOSIS — R41840 Attention and concentration deficit: Secondary | ICD-10-CM

## 2019-11-03 DIAGNOSIS — R2689 Other abnormalities of gait and mobility: Secondary | ICD-10-CM | POA: Diagnosis not present

## 2019-11-03 DIAGNOSIS — R41844 Frontal lobe and executive function deficit: Secondary | ICD-10-CM

## 2019-11-03 DIAGNOSIS — R1312 Dysphagia, oropharyngeal phase: Secondary | ICD-10-CM

## 2019-11-03 DIAGNOSIS — R293 Abnormal posture: Secondary | ICD-10-CM

## 2019-11-03 DIAGNOSIS — R29818 Other symptoms and signs involving the nervous system: Secondary | ICD-10-CM

## 2019-11-03 DIAGNOSIS — R29898 Other symptoms and signs involving the musculoskeletal system: Secondary | ICD-10-CM

## 2019-11-03 NOTE — Therapy (Signed)
[facility] [address] [place], [place], [address] Phone: [phone]   Fax:  [phone]  Occupational Therapy Treatment  Patient Details  Name: Ruben Silva MRN: [id number] Date of Birth: 03-15-1971 Referring Provider (OT): Dr. Carles Collet   Encounter Date: 11/03/2019  OT End of Session - 11/03/19 1006    Visit Number  8    Number of Visits  16    Date for OT Re-Evaluation  11/14/19    Authorization Type  UHC -24 visits OT    Authorization - Visit Number  8    Authorization - Number of Visits  24    OT Start Time  [id number]   needed to leave early for work   OT Stop Time  0957    OT Time Calculation (min)  20 min    Activity Tolerance  Patient tolerated treatment well    Behavior During Therapy  [facility] for tasks assessed/performed       Past Medical History:  Diagnosis Date  . Bell's palsy   . Constipation   . Erectile dysfunction   . History of stomach ulcers   . Hypertension   . Insomnia   . Knee pain   . Murmur   . Parkinson disease ([place])   . Parkinsons ([place])   . Weakness generalized     Past Surgical History:  Procedure Laterality Date  . NO PAST SURGERIES      There were no vitals filed for this visit.  Subjective Assessment - 11/03/19 0942    Subjective   Pt report that putting socks on is better, putting on pants is still difficulty, but getting better.    Pertinent History  PD since 2016, HTN, heart murmur, MRI from 2019 shows L3-4, L4-5 disc bulge, no nerve involvement, pt reports injury to right elbow(pt states torn ligament)    Patient Stated Goals  improve speed with ADLs    Currently in Pain?  No/denies    Pain Onset  1 to 4 weeks ago        Sitting, reaching overhead>floor with BUEs with focus on stretch and slow, controlled movement with big amplitude, min v.c. for timing and large amplitude (head positioning and finger abduction).  Then reaching to touch toes (opposite UE/LE) to  simulate LB dressing and with focus, cues for large amplitude and controlled movement.  Drawing scarf in each hand with min difficulty (R>L), cueing for shoulder positioning in prep for clothing adjustments with focus on large amplitude movement and finger extension.  Hitting targets with boomwhackers in sequence for dual task with min cueing for large amplitude and min cueing for cognitive component.    Pt reports that he needs to end session early due to needing to return to work as he did not realize he had 3 sessions today.  Reprinted pt's schedule (as pt only has 2 sessions scheduled next Friday, pt may have looked at wrong date).       OT Education - 11/03/19 1134    Education Details  pt demo lifting exercise that he was doing at gym--cautioned pt against this due to shoulder positioning and amount of weight with risk of injury    Person(s) Educated  Patient    Methods  Explanation;Demonstration    Comprehension  Verbalized understanding       OT Short Term Goals - 11/01/19 1706      OT SHORT TERM GOAL #1   Title  I with PD specific HEP  Time  4    Period  Weeks    Status  Achieved    Target Date  10/15/19      OT SHORT TERM GOAL #2   Title  I with adapted strategies for ADLS/ IADLS in order to maximize safety and independence    Time  4    Period  Weeks    Status  On-going      OT SHORT TERM GOAL #3   Title  Pt will demonstrate improved LUE fine motor coordination for ADLs as evidenced by decreasing 9 hole peg test score to 30 secs or less.    Baseline  RUE 20.96 LUE 33.28,    Time  4    Period  Weeks    Status  Achieved   20.84 secs     OT SHORT TERM GOAL #4   Title  Pt will verbalize understanding of compensatory strategies for memory/ cognitive deficits and ways to keep thinking skills sharp.    Time  4    Period  Weeks    Status  Achieved        OT Long Term Goals - 11/01/19 1707      OT LONG TERM GOAL #1   Title  Pt will verbalize understanding  of ways to prevent future PD related complications.-11/14/19    Time  8    Period  Weeks    Status  New      OT LONG TERM GOAL #2   Title  Pt will demonstrate increased fine motor coordination for ADLs as evidenced by decreasing 3 button/ unbutton test to 43 secs or less.    Baseline  48 secs    Time  8    Period  Weeks    Status  Achieved   23.66 secs     OT LONG TERM GOAL #3   Title  Pt will demonstrate ability to retrieve a lightweight object from overhead shelf with -15 elbow  extension fo RUE.    Baseline  RUE -20(Pt reports injuring elbow), LUE -5    Time  8    Period  Weeks    Status  On-going      OT LONG TERM GOAL #4   Title  Pt will perform all basic ADLs modified indpendently.    Baseline  requires occasional min A form son with pants and shirt    Time  8    Period  Weeks    Status  On-going   Pt needs help approximately 1x week           Plan - 11/03/19 1006    Clinical Impression Statement  Pt is progressing towards goals with improved large amplitude movements; however, pt continues to demo difficulty with timing of movement (too fast) and decr control with speed.  Pt also demo dystonic postures at times with shoulders, neck, trunk noted today.  Shortened session today as pt reports that he did not realize that he had a 3rd appt and needed to get back to work--Reprinted pt's schedule for him (next Friday he only has 2 appts scheduled so pt may have looked at incorrect date)    OT Occupational Profile and History  Detailed Assessment- Review of Records and additional review of physical, cognitive, psychosocial history related to current functional performance    Occupational performance deficits (Please refer to evaluation for details):  ADL's;IADL's;Rest and Sleep;Work;Leisure;Social Participation;Play    Body Structure / Function / Physical Skills  ADL;Endurance;UE functional use;Balance;Flexibility;Pain;FMC;Vision;Gait;ROM;GMC;Coordination;Decreased  knowledge of  precautions;IADL;Decreased knowledge of use of DME;Dexterity;Strength;Mobility;Tone    Cognitive Skills  Attention;Emotional;Energy/Drive;Memory;Perception;Problem Solve;Safety Awareness;Sequencing;Thought;Understand    Rehab Potential  Good    Clinical Decision Making  Limited treatment options, no task modification necessary    Comorbidities Affecting Occupational Performance:  May have comorbidities impacting occupational performance    Modification or Assistance to Complete Evaluation   No modification of tasks or assist necessary to complete eval    OT Frequency  2x / week    OT Duration  8 weeks    OT Treatment/Interventions  Self-care/ADL training;Energy conservation;Aquatic Therapy;DME and/or AE instruction;Ultrasound;Patient/family education;Passive range of motion;Balance training;Cryotherapy;Fluidtherapy;Splinting;Building services engineer;Therapeutic activities;Manual Therapy;Therapeutic exercise;Moist Heat;Neuromuscular education;Cognitive remediation/compensation;Visual/perceptual remediation/compensation    Plan  work towards goals, reinforce big, but controlled/deliberate movments with ADLs--focus on control and timing    Consulted and Agree with Plan of Care  Patient       Patient will benefit from skilled therapeutic intervention in order to improve the following deficits and impairments:   Body Structure / Function / Physical Skills: ADL, Endurance, UE functional use, Balance, Flexibility, Pain, FMC, Vision, Gait, ROM, GMC, Coordination, Decreased knowledge of precautions, IADL, Decreased knowledge of use of DME, Dexterity, Strength, Mobility, Tone Cognitive Skills: Attention, Emotional, Energy/Drive, Memory, Perception, Problem Solve, Safety Awareness, Sequencing, Thought, Understand     Visit Diagnosis: Other lack of coordination  Frontal lobe and executive function deficit  Attention and concentration deficit  Other symptoms and signs involving the musculoskeletal  system  Other symptoms and signs involving the nervous system  Abnormal posture  Other abnormalities of gait and mobility    Problem List Patient Active Problem List   Diagnosis Date Noted  . Acquired hammer toe of left foot 03/10/2019  . Right lumbar radiculopathy 12/27/2017  . Gastroparesis 07/06/2017  . Orthostatic dizziness 05/12/2016  . Low back pain 05/12/2016  . Chronic constipation 12/17/2015  . Chronic insomnia 12/17/2015  . Left hip pain 12/16/2015  . Parkinson's disease ([facility]) 09/11/2015  . HYPERTENSION 11/21/2008  . MURMUR 11/21/2008  . CHEST PAIN, ATYPICAL 11/21/2008    [facility] 11/03/2019, 11:35 AM  [facility] [address] [place], [place], [address] Phone: [phone]   Fax:  [phone]  Name: Nicola Quesnell MRN: [id number] Date of Birth: [birth year]   Willa Frater, OTR/L [facility] [address] [place], [place]  [address] [phone] phone [phone] 11/03/19 11:35 AM

## 2019-11-03 NOTE — Therapy (Signed)
[facility] [address] [place], [place], [address] Phone: [phone]   Fax:  [phone]  Physical Therapy Treatment  Patient Details  Name: Ruben Silva MRN: [id number] Date of Birth: 09-Aug-1971 Referring Provider (PT): Tat, Wells Guiles   Encounter Date: 11/03/2019  PT End of Session - 11/03/19 1521    Visit Number  9    Number of Visits  18    Authorization Type  UHC-VL 24 PT, 24 OT, 24 ST    Authorization - Visit Number  9    Authorization - Number of Visits  24    PT Start Time  7124    PT Stop Time  0930    PT Time Calculation (min)  41 min    Activity Tolerance  Patient tolerated treatment well    Behavior During Therapy  [facility] for tasks assessed/performed       Past Medical History:  Diagnosis Date  . Bell's palsy   . Constipation   . Erectile dysfunction   . History of stomach ulcers   . Hypertension   . Insomnia   . Knee pain   . Murmur   . Parkinson disease ([place])   . Parkinsons ([place])   . Weakness generalized     Past Surgical History:  Procedure Laterality Date  . NO PAST SURGERIES      There were no vitals filed for this visit.  Subjective Assessment - 11/03/19 0850    Subjective  No pain, no changes, no sleep last night.    Diagnostic tests  MRI from 2019 shows L3-4, L4-5 disc bulge, no nerve involvement    Patient Stated Goals  Pt's goal for physical therapy:  to get back to where I was, to avoid the Parkinson's from advancing.    Currently in Pain?  No/denies    Pain Onset  1 to 4 weeks ago                       [facility] Adult PT Treatment/Exercise - 11/03/19 5809      Ambulation/Gait   Ambulation/Gait  Yes    Ambulation/Gait Assistance  6: Modified independent (Device/Increase time)    Ambulation/Gait Assistance Details  Pt with improved posture with gait, but cues provided for arm swing, foot clearance (versus marching type gait pattern)    Ambulation  Distance (Feet)  400 Feet   x 2; 230 ft of resisted gait   Assistive device  None    Gait Pattern  Step-through pattern;Decreased arm swing - right;Decreased arm swing - left;Decreased step length - left;Decreased step length - right;Decreased trunk rotation;Trunk flexed;Narrow base of support    Ambulation Surface  Level;Indoor    Pre-Gait Activities  Pre-gait:  resistance given through hips posteriorly and posterior-lateral, x 5 reps each, cues for abdominal activation, upright posture.    Gait Comments  Discussed amplitude of gait, using resisted gait mechanism for improved posture, improved foot clearance and arm swing.  Once resistance removes, pt appears to have overall smoother pattern of gait.      High Level Balance   High Level Balance Comments  On balance beam:  marching in place x 10 reps, alternating hip kicks x 10 reps; alternating step taps x 10 reps, then forward>back step taps x 10 reps.  Continuing on balance beam:  sidestep squats 2 reps R and L in parallel bars, then tandem forward/back gait 3 reps in parallel bars.  BUE support for all  balance beam activities.      Exercises   Exercises  Other Exercises    Other Exercises   Standing postural exercises at doorframe:  cues for abdominal activation and neutral spine posture, scapular retraction x 10 reps, then additional 10 reps with 2# weight BUEs; cues for correct neck posture (to avoid neck hyperextension)      Knee/Hip Exercises: Aerobic   Stepper  SciFit, Seated Stepper, Level 3, 4 extremities, x 6 minutes, RPM >80-90, for aerobic warm up, flexibility, and lower extremity strength.  Cues for upright posture throughout.      Knee/Hip Exercises: Machines for Strengthening   Cybex Leg Press  120# 2 sets x 10 reps BLE; 80#, RLE only x 10 reps   Cues for slowed concentric and eccentric control              PT Short Term Goals - 10/17/19 1452      PT SHORT TERM GOAL #1   Title  Patient will be independent with HEP  to address strength, balance, posture.  TARGET 5 weeks:  10/13/2019    Baseline  Pt reports performing at home, but needs cues for technique. 10/17/2019    Time  5    Period  Weeks    Status  Partially Met    Target Date  10/13/19      PT SHORT TERM GOAL #2   Title  Pt will improve 5x sit<>stand to less than or equal to 10 seconds for improved functional lower extremity strength.    Time  5    Period  Weeks    Status  Achieved    Target Date  10/13/19      PT SHORT TERM GOAL #3   Title  Pt will verbalize at least 3 means to decrease low back pain, for overall improved funcitonal mobility.    Time  5    Period  Weeks    Status  Achieved    Target Date  10/13/19      PT SHORT TERM GOAL #4   Title  Pt will improve TUG/TUG manual score to less than or equal to 10% difference for improved dual tasking with gait.    Time  5    Period  Weeks    Status  Achieved    Target Date  10/13/19        PT Long Term Goals - 10/17/19 1456      PT LONG TERM GOAL #1   Title  Patient will be independent with updated HEP and able to verbalize a plan for appropriate community-based exercise upon discharge from PT. TARGET 11/10/2019    Time  9    Period  Weeks    Status  New      PT LONG TERM GOAL #2   Title  Pt will improve gait velocity to at least 2.62 ft/sec for improved gait efficiency and safety.    Time  9    Period  Weeks      PT LONG TERM GOAL #3   Title  Pt will improve 3MWT to at least 385 ft for improved gait efficiency and safety.    Time  9    Period  Weeks    Status  New      PT LONG TERM GOAL #4   Title  Pt will verbalize understanding of posture and body mechanics for decreased back/neck pain, improved participation with functional/work activities.    Time  9  Period  Weeks    Status  New            Plan - 11/03/19 1529    Clinical Impression Statement  Skilled PT sessions today focused on strength, aerobic, gait and balance training.  Pt continues to need cues  throughout various activities for attention to posture and slowed movement patterns.  Pt with no c/o pain today in PT session; will conitnue to benefit from skilled PT to address balance, functional strength, posture and gait for improved funcitonla mobility.    Personal Factors and Comorbidities  Comorbidity 3+    Comorbidities  PMH:  Bell's palsy, HTN, knee pain, PD, esophageal stricture (stretched 08/2019), R chondromalacia patella, R knee OA, GERD    Examination-Activity Limitations  Locomotion Level;Transfers    Examination-Participation Restrictions  Community Activity;Other   Exercise   Stability/Clinical Decision Making  Stable/Uncomplicated    Rehab Potential  Good    PT Frequency  Other (comment)   1x/wk (eval), then 2x/wk for 8 weeks   PT Duration  Other (comment)   Plus eval visit; total POC = 9 weeks   PT Treatment/Interventions  ADLs/Self Care Home Management;Neuromuscular re-education;Balance training;Therapeutic exercise;Therapeutic activities;Functional mobility training;Gait training;Patient/family education    PT Next Visit Plan  Week of 3/8 is wk 6 of 8 in POC (goal dates may need to be extended, as pt missed some appts); continue to work on dual tasking with gait, coordinated UE/lower extremity movements with gait and high level balance; work towards [place].    Consulted and Agree with Plan of Care  Patient       Patient will benefit from skilled therapeutic intervention in order to improve the following deficits and impairments:  Abnormal gait, Decreased coordination, Difficulty walking, Pain, Decreased balance, Improper body mechanics, Postural dysfunction, Decreased strength, Decreased mobility  Visit Diagnosis: Other abnormalities of gait and mobility  Abnormal posture  Other symptoms and signs involving the nervous system     Problem List Patient Active Problem List   Diagnosis Date Noted  . Acquired hammer toe of left foot 03/10/2019  . Right lumbar  radiculopathy 12/27/2017  . Gastroparesis 07/06/2017  . Orthostatic dizziness 05/12/2016  . Low back pain 05/12/2016  . Chronic constipation 12/17/2015  . Chronic insomnia 12/17/2015  . Left hip pain 12/16/2015  . Parkinson's disease ([place]) 09/11/2015  . HYPERTENSION 11/21/2008  . MURMUR 11/21/2008  . CHEST PAIN, ATYPICAL 11/21/2008    Nayden Czajka W. 11/03/2019, 3:33 PM  Frazier Butt., PT   [facility] [address] [place], [place], [address] Phone: [phone]   Fax:  [phone]  Name: Ruben Silva MRN: [id number] Date of Birth: 1971-08-28

## 2019-11-03 NOTE — Patient Instructions (Signed)
  Please complete the assigned speech therapy homework prior to your next session and return it to the speech therapist at your next visit.  

## 2019-11-03 NOTE — Therapy (Signed)
[facility] [address] [place], [place], [address] Phone: [phone]   Fax:  [phone]  Speech Language Pathology Treatment  Patient Details  Name: Ruben Silva MRN: [id number] Date of Birth: 06-Mar-1971 Referring Provider (SLP): Alonza Bogus, DO   Encounter Date: 11/03/2019  End of Session - 11/03/19 1142    Visit Number  6    Number of Visits  17    Date for SLP Re-Evaluation  11/24/19    Authorization - Visit Number  5    Authorization - Number of Visits  58    SLP Start Time  0805    SLP Stop Time   0845    SLP Time Calculation (min)  40 min    Activity Tolerance  Patient tolerated treatment well       Past Medical History:  Diagnosis Date  . Bell's palsy   . Constipation   . Erectile dysfunction   . History of stomach ulcers   . Hypertension   . Insomnia   . Knee pain   . Murmur   . Parkinson disease ([place])   . Parkinsons ([place])   . Weakness generalized     Past Surgical History:  Procedure Laterality Date  . NO PAST SURGERIES      There were no vitals filed for this visit.  Subjective Assessment - 11/03/19 0812    Subjective  "I got about two hours last night (sleep). I didn't get any Wednesday into Thursday."            ADULT SLP TREATMENT - 11/03/19 0814      General Information   Behavior/Cognition  Alert;Cooperative;Pleasant mood      Treatment Provided   Treatment provided  Cognitive-Linquistic      Cognitive-Linquistic Treatment   Treatment focused on  Dysarthria    Skilled Treatment  Pt on his phone - SLP asked him to put away - pt stated he was in a conversation with his "almost ex-wife". Pt entered with sub-normal volume in simple conversation prior to loud /a/ (3-4 minutes). SLP had pt produce loud /a/ to habitualize louder converstional speech - pt averaged upper 90s dB with min A to have decr'd laryngeal push/strain x1 - pt corrected for 2 remaining reps. In  spontaneous conversation pt with widely variable structured sentence level speech tasks, pt req'd min A occasionally to ensure abdominal strength/support/push to achieve WNL volume. In conversation after structured tasks, some limited carryover to spontaneous speech was observed.       Assessment / Recommendations / Plan   Plan  Continue with current plan of care      Progression Toward Goals   Progression toward goals  Progressing toward goals         SLP Short Term Goals - 11/03/19 1143      SLP SHORT TERM GOAL #1   Title  Pt will generate loud /a/ or extended "hey!" with average of low 90s dB over 4 sessions    Baseline  10/03/19, 10-20-19    Status  Partially Met      SLP SHORT TERM GOAL #2   Title  Pt will use speech volume average low 70sdB when responding with sentences 18/20 over 3 sessions    Status  Not Met      SLP SHORT TERM GOAL #3   Title  pt will use abdominal breathing at rest 70% of the time over two sessions    Status  Partially Met  SLP SHORT TERM GOAL #4   Title  in 5-7 minutes simple conversation pt will generate speech volume of low 70s over 3 sessions    Status  Not Met      SLP SHORT TERM GOAL #5   Title  pt will complete cognitive linguistic testing in the first 1-2 sessions    Baseline  CLQT 09/26/19    Status  Achieved      SLP SHORT TERM GOAL #6   Title  Patient will use at least one compensatory strategy for memory or attention in 2 therapy sessions.    Status  Not Met      SLP SHORT TERM GOAL #7   Title  Patient will ID when impulsivity negatively impacts his performance in cognitive tasks in 75% of opportunities x 2 sessions.    Status  Not Met       SLP Long Term Goals - 11/03/19 1144      SLP LONG TERM GOAL #1   Title  Pt will generate loud /a/ with average of low-mid 90s dB over 4 total sessions    Baseline  10-20-19, 11-03-19    Time  5    Period  Weeks   or 17 total sessions; for all LTGs   Status  On-going      SLP LONG TERM  GOAL #2   Title  pt will use abdominal breathing 65% of the time in 5 minutes conversation over 3 sessions    Time  5    Period  Weeks    Status  Revised      SLP LONG TERM GOAL #3   Title  in 8 minutes simple-mod complex conversation pt will generate speech volume of low 70s with nonverbal cues, over 3 sessions    Time  5    Period  Weeks    Status  Revised      SLP LONG TERM GOAL #4   Title  pt will follow swallow precautions with POs in 3 sessions with modified independence    Time  5    Period  Weeks    Status  On-going      SLP LONG TERM GOAL #5   Title  Patient will report use of memory compensation system to recall/manage tasks at work outside of [place] between 4 sessions.    Time  5    Period  Weeks    Status  On-going      SLP LONG TERM GOAL #6   Title  Pt will use slow rate and double checking to decrease errors in functional tasks (95% accuracy with double checking).    Time  6    Period  Weeks    Status  New       Plan - 11/03/19 1142    Clinical Impression Statement  Patient presents with decreased vocal volume (average mid 60s dB at 30 cm at evaluation). Cognitive assessment with overall scores (CLQT) measuring WNL, however functional deficits noted including recall, attention/ attention to detail, impulsivity, and executive function skills. Pt would benefit from skilled ST targeting incr'd speech volume, adherence to aspiration precations, and improving pt's cognitive communication skills.    Speech Therapy Frequency  2x / week    Duration  --   8 weeks or 17 total sessions; pt allowed 24 ST/year so may end early to conserve visits   Treatment/Interventions  Aspiration precaution training;Pharyngeal strengthening exercises;Diet toleration management by SLP;Internal/external aids;Patient/family education;Cognitive reorganization;Compensatory strategies;SLP instruction and feedback;Functional  tasks;Environmental controls;Oral motor exercises    Potential to Achieve  Goals  Good    Potential Considerations  Ability to learn/carryover information;Severity of impairments    Consulted and Agree with Plan of Care  Patient       Patient will benefit from skilled therapeutic intervention in order to improve the following deficits and impairments:   Dysarthria and anarthria  Cognitive communication deficit  Dysphagia, oropharyngeal phase    Problem List Patient Active Problem List   Diagnosis Date Noted  . Acquired hammer toe of left foot 03/10/2019  . Right lumbar radiculopathy 12/27/2017  . Gastroparesis 07/06/2017  . Orthostatic dizziness 05/12/2016  . Low back pain 05/12/2016  . Chronic constipation 12/17/2015  . Chronic insomnia 12/17/2015  . Left hip pain 12/16/2015  . Parkinson's disease ([place]) 09/11/2015  . HYPERTENSION 11/21/2008  . MURMUR 11/21/2008  . CHEST PAIN, ATYPICAL 11/21/2008    [facility] ,MS, CCC-SLP  11/03/2019, 11:46 AM  [facility] [address] [place], [place], [address] Phone: [phone]   Fax:  [phone]   Name: Ruben Silva MRN: [id number] Date of Birth: [birth year]

## 2019-11-08 ENCOUNTER — Ambulatory Visit: Payer: 59 | Admitting: Physical Therapy

## 2019-11-08 ENCOUNTER — Other Ambulatory Visit: Payer: Self-pay

## 2019-11-08 ENCOUNTER — Encounter: Payer: Self-pay | Admitting: Physical Therapy

## 2019-11-08 ENCOUNTER — Ambulatory Visit: Payer: 59 | Admitting: Occupational Therapy

## 2019-11-08 DIAGNOSIS — R41840 Attention and concentration deficit: Secondary | ICD-10-CM

## 2019-11-08 DIAGNOSIS — R2689 Other abnormalities of gait and mobility: Secondary | ICD-10-CM

## 2019-11-08 DIAGNOSIS — R29898 Other symptoms and signs involving the musculoskeletal system: Secondary | ICD-10-CM

## 2019-11-08 DIAGNOSIS — R293 Abnormal posture: Secondary | ICD-10-CM

## 2019-11-08 DIAGNOSIS — R29818 Other symptoms and signs involving the nervous system: Secondary | ICD-10-CM

## 2019-11-08 DIAGNOSIS — R41844 Frontal lobe and executive function deficit: Secondary | ICD-10-CM

## 2019-11-08 DIAGNOSIS — R278 Other lack of coordination: Secondary | ICD-10-CM

## 2019-11-08 NOTE — Therapy (Signed)
[facility] [address] [place], [place], [address] Phone: [phone]   Fax:  [phone]  Physical Therapy Treatment  Patient Details  Name: Ruben Silva MRN: [id number] Date of Birth: 06-15-1971 Referring Provider (PT): Tat, Wells Guiles   Encounter Date: 11/08/2019  PT End of Session - 11/08/19 2121    Visit Number  10    Number of Visits  18    Authorization Type  [employer] 24 PT, 24 OT, 24 ST    Authorization - Visit Number  10    Authorization - Number of Visits  24    PT Start Time  1620    PT Stop Time  1658    PT Time Calculation (min)  38 min    Activity Tolerance  Patient tolerated treatment well    Behavior During Therapy  WFL for tasks assessed/performed       Past Medical History:  Diagnosis Date  . Bell's palsy   . Constipation   . Erectile dysfunction   . History of stomach ulcers   . Hypertension   . Insomnia   . Knee pain   . Murmur   . Parkinson disease ([place])   . Parkinsons ([place])   . Weakness generalized     Past Surgical History:  Procedure Laterality Date  . NO PAST SURGERIES      There were no vitals filed for this visit.  Subjective Assessment - 11/08/19 1622    Subjective  Have been having some chest pain at night-doctor is aware.  EKG was normal.    Diagnostic tests  MRI from 2019 shows L3-4, L4-5 disc bulge, no nerve involvement    Patient Stated Goals  Pt's goal for physical therapy:  to get back to where I was, to avoid the Parkinson's from advancing.    Currently in Pain?  No/denies    Pain Onset  1 to 4 weeks ago                       [facility] Adult PT Treatment/Exercise - 11/08/19 1624      Transfers   Transfers  Sit to Stand;Stand to Sit    Sit to Stand  6: Modified independent (Device/Increase time);From chair/3-in-1;Without upper extremity assist    Stand to Sit  6: Modified independent (Device/Increase time);Without upper extremity assist;To  chair/3-in-1    Number of Reps  10 reps;2 sets   standing on solid surface, then on [employer], from 20" mat     Ambulation/Gait   Ambulation/Gait  Yes    Ambulation/Gait Assistance  5: Supervision    Ambulation/Gait Assistance Details  Outdoor gait, cues for foot clearance, posture; then cues for posture, foot clearance, and heelstrike on inclines/decline,s, including grass.  Also obstacle negotiation with stepping over and around obstacles outdoors.  No LOB noted.    Ambulation Distance (Feet)  1200 Feet   outdoor surfaces   Assistive device  None    Gait Pattern  Step-through pattern;Decreased arm swing - right;Decreased arm swing - left;Decreased step length - left;Decreased step length - right;Decreased trunk rotation;Trunk flexed    Ambulation Surface  Level;Indoor;Unlevel;Outdoor;Paved;Grass    Pre-Gait Activities  Indoors:  6 minutes of walking:  1200 ft; 3 minutes walking:  600 ft    Gait Comments  Occasional postural cues to decrease forward flexed posture on indoor gait.  With outdoor gait, cues for foot clearance, arm swing.      Self-Care   Self-Care  Other Self-Care Comments    Other Self-Care Comments   Discussed community fitness options:  going to gym; discussed return to walking at [facility] with family member; discussed starting at 18/4 of the 2 mile loop and work up to full distance.  Discussed option for PD cycling class at [facility].   Provided handout on community/online PD resources.             PT Education - 11/08/19 2120    Education Details  Discussed community fitness/online and local PD resources    Person(s) Educated  Patient    Methods  Explanation;Handout    Comprehension  Verbalized understanding       PT Short Term Goals - 10/17/19 1452      PT SHORT TERM GOAL #1   Title  Patient will be independent with HEP to address strength, balance, posture.  TARGET 5 weeks:  10/13/2019    Baseline  Pt reports performing at home, but needs cues for  technique. 10/17/2019    Time  5    Period  Weeks    Status  Partially Met    Target Date  10/13/19      PT SHORT TERM GOAL #2   Title  Pt will improve 5x sit<>stand to less than or equal to 10 seconds for improved functional lower extremity strength.    Time  5    Period  Weeks    Status  Achieved    Target Date  10/13/19      PT SHORT TERM GOAL #3   Title  Pt will verbalize at least 3 means to decrease low back pain, for overall improved funcitonal mobility.    Time  5    Period  Weeks    Status  Achieved    Target Date  10/13/19      PT SHORT TERM GOAL #4   Title  Pt will improve TUG/TUG manual score to less than or equal to 10% difference for improved dual tasking with gait.    Time  5    Period  Weeks    Status  Achieved    Target Date  10/13/19        PT Long Term Goals - 11/08/19 2122      PT LONG TERM GOAL #1   Title  Patient will be independent with updated HEP and able to verbalize a plan for appropriate community-based exercise upon discharge from PT. TARGET 11/10/2019    Time  9    Period  Weeks    Status  New      PT LONG TERM GOAL #2   Title  Pt will improve gait velocity to at least 2.62 ft/sec for improved gait efficiency and safety.    Time  9    Period  Weeks      PT LONG TERM GOAL #3   Title  Pt will improve 3MWT to at least 385 ft for improved gait efficiency and safety.    Baseline  600 ft in 3 minute walk    Time  9    Period  Weeks    Status  Achieved      PT LONG TERM GOAL #4   Title  Pt will verbalize understanding of posture and body mechanics for decreased back/neck pain, improved participation with functional/work activities.    Time  9    Period  Weeks    Status  New  Plan - 11/08/19 2122    Clinical Impression Statement  Pt has met LTG for 3 minute walk test, improved significantly to 600 ft in 3 minutes, from 335 ft at eval.  Outdoor gait activities today, with obstacle negotiation, incline, declines, varied  surfaces.  Pt continues to require cues for foot clearance and posture at times.  Pt is progressing towards remaining LTGs, and anticipate pt may be able to d/c earlier than full 8 wk POC.    Personal Factors and Comorbidities  Comorbidity 3+    Comorbidities  PMH:  Bell's palsy, HTN, knee pain, PD, esophageal stricture (stretched 08/2019), R chondromalacia patella, R knee OA, GERD    Examination-Activity Limitations  Locomotion Level;Transfers    Examination-Participation Restrictions  Community Activity;Other   Exercise   Stability/Clinical Decision Making  Stable/Uncomplicated    Rehab Potential  Good    PT Frequency  Other (comment)   1x/wk (eval), then 2x/wk for 8 weeks   PT Duration  Other (comment)   Plus eval visit; total POC = 9 weeks   PT Treatment/Interventions  ADLs/Self Care Home Management;Neuromuscular re-education;Balance training;Therapeutic exercise;Therapeutic activities;Functional mobility training;Gait training;Patient/family education    PT Next Visit Plan  Week of 3/8 is wk 6 of 8 in POC (goal dates may need to be extended, as pt missed some appts); continue to work on dual tasking with gait, coordinated UE/lower extremity movements with gait and high level balance; work towards [place].    Consulted and Agree with Plan of Care  Patient       Patient will benefit from skilled therapeutic intervention in order to improve the following deficits and impairments:  Abnormal gait, Decreased coordination, Difficulty walking, Pain, Decreased balance, Improper body mechanics, Postural dysfunction, Decreased strength, Decreased mobility  Visit Diagnosis: Other abnormalities of gait and mobility  Abnormal posture     Problem List Patient Active Problem List   Diagnosis Date Noted  . Acquired hammer toe of left foot 03/10/2019  . Right lumbar radiculopathy 12/27/2017  . Gastroparesis 07/06/2017  . Orthostatic dizziness 05/12/2016  . Low back pain 05/12/2016  . Chronic  constipation 12/17/2015  . Chronic insomnia 12/17/2015  . Left hip pain 12/16/2015  . Parkinson's disease ([place]) 09/11/2015  . HYPERTENSION 11/21/2008  . MURMUR 11/21/2008  . CHEST PAIN, ATYPICAL 11/21/2008    Ruben Silva W. 11/08/2019, 9:27 PM  Frazier Butt., PT   [facility] [address] [place], [place], [address] Phone: [phone]   Fax:  [phone]  Name: Alfie Rideaux MRN: [id number] Date of Birth: 1970/12/21
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
## 2019-11-10 ENCOUNTER — Ambulatory Visit: Payer: 59 | Admitting: Occupational Therapy

## 2019-11-10 ENCOUNTER — Ambulatory Visit: Payer: 59

## 2019-11-10 ENCOUNTER — Ambulatory Visit: Payer: Self-pay

## 2019-11-10 ENCOUNTER — Ambulatory Visit: Payer: 59 | Admitting: Physical Therapy

## 2019-11-10 ENCOUNTER — Other Ambulatory Visit: Payer: Self-pay

## 2019-11-10 ENCOUNTER — Encounter: Payer: Self-pay | Admitting: Occupational Therapy

## 2019-11-10 DIAGNOSIS — R2689 Other abnormalities of gait and mobility: Secondary | ICD-10-CM | POA: Diagnosis not present

## 2019-11-10 DIAGNOSIS — R29818 Other symptoms and signs involving the nervous system: Secondary | ICD-10-CM

## 2019-11-10 DIAGNOSIS — R41844 Frontal lobe and executive function deficit: Secondary | ICD-10-CM

## 2019-11-10 DIAGNOSIS — R278 Other lack of coordination: Secondary | ICD-10-CM

## 2019-11-10 DIAGNOSIS — R41841 Cognitive communication deficit: Secondary | ICD-10-CM

## 2019-11-10 DIAGNOSIS — R1312 Dysphagia, oropharyngeal phase: Secondary | ICD-10-CM

## 2019-11-10 DIAGNOSIS — R41840 Attention and concentration deficit: Secondary | ICD-10-CM

## 2019-11-10 DIAGNOSIS — R29898 Other symptoms and signs involving the musculoskeletal system: Secondary | ICD-10-CM

## 2019-11-10 DIAGNOSIS — R293 Abnormal posture: Secondary | ICD-10-CM

## 2019-11-10 DIAGNOSIS — R471 Dysarthria and anarthria: Secondary | ICD-10-CM

## 2019-11-10 NOTE — Therapy (Signed)
[facility] [address] [place], [place], [address] Phone: [phone]   Fax:  [phone]  Speech Language Pathology Treatment  Patient Details  Name: Ruben Silva MRN: [id number] Date of Birth: [birth year] Referring Provider (SLP): Alonza Bogus, DO   Encounter Date: 11/10/2019  End of Session - 11/10/19 3545    Visit Number  7    Number of Visits  17    Date for SLP Re-Evaluation  11/24/19    Authorization - Visit Number  6    Authorization - Number of Visits  33    SLP Start Time  0804    SLP Stop Time   0845    SLP Time Calculation (min)  41 min    Activity Tolerance  Patient tolerated treatment well       Past Medical History:  Diagnosis Date  . Bell's palsy   . Constipation   . Erectile dysfunction   . History of stomach ulcers   . Hypertension   . Insomnia   . Knee pain   . Murmur   . Parkinson disease ([place])   . Parkinsons ([place])   . Weakness generalized     Past Surgical History:  Procedure Laterality Date  . NO PAST SURGERIES      There were no vitals filed for this visit.  Subjective Assessment - 11/10/19 1528    Subjective  "Am I gonna get my cap and gown today, [name]? OT just set me free."    Currently in Pain?  No/denies            ADULT SLP TREATMENT - 11/10/19 0830      General Information   Behavior/Cognition  Alert;Cooperative;Pleasant mood      Treatment Provided   Treatment provided  Cognitive-Linquistic      Cognitive-Linquistic Treatment   Treatment focused on  Dysarthria;Cognition    Skilled Treatment  SLP told pt he had scheduled ST frequency at x1/week and that SLP suggested x2/week. Pt stated he was "hitting 100" with his /a/, and knew "what (he) need(ed) to do to be better, (he) was just tired." Pt entered with sub-normal volume in simple conversation prior to loud /a/ (2-3 minutes). SLP had pt produce loud /a/ to habitualize louder converstional speech  - pt averaged upper 90s dB without laryngeal push/strain. In semi-structured sentence response tasks pt averaged mid to upper 60s dB volume. SLP consistently asked pt to feel his abdominal musculature engage like he does with loud /a/, occasional success in incr'ing volume. Very limited carryover into 1-2 sentence responses of spontaneous simple conversation. Using this as an example, SLP strongly encouraged pt to schedule additional visit/week to make x2/week ST. Pt told SLP he was "good with coming once/week." SLP explained rationale for x2/week and pt stood firm on once a week ST.       Assessment / Recommendations / Plan   Plan  Continue with current plan of care      Progression Toward Goals   Progression toward goals  Progressing toward goals       SLP Education - 11/10/19 1528    Education Details  x1/week ST is not sufficient to meet pt's goal of WNL volume, rationale for x2/week    Person(s) Educated  Patient    Methods  Explanation    Comprehension  Verbalized understanding       SLP Short Term Goals - 11/10/19 0825      SLP SHORT TERM GOAL #1  Title  Pt will generate loud /a/ or extended "hey!" with average of low 90s dB over 4 sessions    Baseline  10/03/19, 10-20-19    Status  Partially Met      SLP SHORT TERM GOAL #2   Title  Pt will use speech volume average low 70sdB when responding with sentences 18/20 over 3 sessions    Status  Not Met      SLP SHORT TERM GOAL #3   Title  pt will use abdominal breathing at rest 70% of the time over two sessions    Status  Partially Met      SLP SHORT TERM GOAL #4   Title  in 5-7 minutes simple conversation pt will generate speech volume of low 70s over 3 sessions    Status  Not Met      SLP SHORT TERM GOAL #5   Title  pt will complete cognitive linguistic testing in the first 1-2 sessions    Baseline  CLQT 09/26/19    Status  Achieved      SLP SHORT TERM GOAL #6   Title  Patient will use at least one compensatory strategy for  memory or attention in 2 therapy sessions.    Status  Not Met      SLP SHORT TERM GOAL #7   Title  Patient will ID when impulsivity negatively impacts his performance in cognitive tasks in 75% of opportunities x 2 sessions.    Status  Not Met       SLP Long Term Goals - 11/10/19 9147      SLP LONG TERM GOAL #1   Title  Pt will generate loud /a/ with average of low-mid 90s dB over 4 total sessions    Baseline  10-20-19, 11-03-19, 11-10-19    Time  4    Period  Weeks   or 17 total sessions; for all LTGs   Status  On-going      SLP LONG TERM GOAL #2   Title  pt will use abdominal breathing 65% of the time in 5 minutes conversation over 3 sessions    Time  4    Period  Weeks    Status  Revised      SLP LONG TERM GOAL #3   Title  in 8 minutes simple-mod complex conversation pt will generate speech volume of low 70s with nonverbal cues, over 3 sessions    Time  4    Period  Weeks    Status  Revised      SLP LONG TERM GOAL #4   Title  pt will follow swallow precautions with POs in 3 sessions with modified independence    Time  4    Period  Weeks    Status  On-going      SLP LONG TERM GOAL #5   Title  Patient will report use of memory compensation system to recall/manage tasks at work outside of [facility] between 4 sessions.    Baseline  11-10-19    Time  4    Period  Weeks    Status  On-going      SLP LONG TERM GOAL #6   Title  Pt will use slow rate and double checking to decrease errors in functional tasks (95% accuracy with double checking).    Time  4    Period  Weeks    Status  On-going       Plan - 11/10/19 1529  Clinical Impression Statement  Patient presents with decreased vocal volume - consistently at mid 60s dB upon entering [place] room. Functional cognitive communication deficits noted including recall, attention/ attention to detail, impulsivity, and executive function skills. Pt would benefit from skilled ST targeting incr'd speech volume, adherence to aspiration  precations, and improving pt's cognitive communication skills however pt only scheduled himself once a week and despite being told (again) rationale for x2/week pt insists remaining at x1/week.    Speech Therapy Frequency  2x / week    Duration  --   8 weeks or 17 total sessions; pt allowed 24 ST/year so may end early to conserve visits   Treatment/Interventions  Aspiration precaution training;Pharyngeal strengthening exercises;Diet toleration management by SLP;Internal/external aids;Patient/family education;Cognitive reorganization;Compensatory strategies;SLP instruction and feedback;Functional tasks;Environmental controls;Oral motor exercises    Potential to Achieve Goals  Good    Potential Considerations  Ability to learn/carryover information;Severity of impairments    Consulted and Agree with Plan of Care  Patient       Patient will benefit from skilled therapeutic intervention in order to improve the following deficits and impairments:   Cognitive communication deficit  Dysarthria and anarthria  Dysphagia, oropharyngeal phase    Problem List Patient Active Problem List   Diagnosis Date Noted  . Acquired hammer toe of left foot 03/10/2019  . Right lumbar radiculopathy 12/27/2017  . Gastroparesis 07/06/2017  . Orthostatic dizziness 05/12/2016  . Low back pain 05/12/2016  . Chronic constipation 12/17/2015  . Chronic insomnia 12/17/2015  . Left hip pain 12/16/2015  . Parkinson's disease ([employer]) 09/11/2015  . HYPERTENSION 11/21/2008  . MURMUR 11/21/2008  . CHEST PAIN, ATYPICAL 11/21/2008    [facility] ,MS, CCC-SLP  11/10/2019, 3:31 PM  [facility] [address] [place], [place], [address] Phone: [phone]   Fax:  [phone]   Name: Darcy Cordner MRN: [id number] Date of Birth: [birth year]

## 2019-11-10 NOTE — Therapy (Signed)
[facility] [address] [place], [place], [address] Phone: [phone]   Fax:  [phone]  Physical Therapy Treatment/Discharge Summary  Patient Details  Name: Ruben Silva MRN: [id number] Date of Birth: 04/02/1971 Referring Provider (PT): Tat, Wells Guiles   Encounter Date: 11/10/2019  PT End of Session - 11/10/19 1027    Visit Number  11    Number of Visits  18    Authorization Type  [employer]-VL 24 PT, 24 OT, 24 ST    Authorization - Visit Number  10    Authorization - Number of Visits  24    PT Start Time  1287    PT Stop Time  0915   d/c day-goals checked and session finished   PT Time Calculation (min)  26 min    Activity Tolerance  Patient tolerated treatment well    Behavior During Therapy  [facility] for tasks assessed/performed       Past Medical History:  Diagnosis Date  . Bell's palsy   . Constipation   . Erectile dysfunction   . History of stomach ulcers   . Hypertension   . Insomnia   . Knee pain   . Murmur   . Parkinson disease ([place])   . Parkinsons ([place])   . Weakness generalized     Past Surgical History:  Procedure Laterality Date  . NO PAST SURGERIES      There were no vitals filed for this visit.  Subjective Assessment - 11/10/19 0850    Subjective  Had a lot of pain yesterday, so I couldn't do the gym very much yesterday.  No pain today.    Diagnostic tests  MRI from 2019 shows L3-4, L4-5 disc bulge, no nerve involvement    Patient Stated Goals  Pt's goal for physical therapy:  to get back to where I was, to avoid the Parkinson's from advancing.    Currently in Pain?  No/denies    Pain Onset  1 to 4 weeks ago                       [facility] Adult PT Treatment/Exercise - 11/10/19 0001      Transfers   Transfers  Sit to Stand;Stand to Sit    Sit to Stand  6: Modified independent (Device/Increase time);From chair/3-in-1;Without upper extremity assist    Five time sit to  stand comments   6.22    Stand to Sit  6: Modified independent (Device/Increase time);Without upper extremity assist;To chair/3-in-1      Ambulation/Gait   Ambulation/Gait  Yes    Ambulation/Gait Assistance  7: Independent    Assistive device  None    Gait Pattern  Step-through pattern;Decreased arm swing - right;Decreased arm swing - left;Decreased step length - left;Decreased step length - right    Gait velocity  7.93 sec = 4.14 ft/sec      Timed Up and Go Test   TUG  Normal TUG;Manual TUG;Cognitive TUG    Normal TUG (seconds)  6.71    Manual TUG (seconds)  6.93    Cognitive TUG (seconds)  7.18      High Level Balance   High Level Balance Comments  PWR! Up positions:  seated PWR! Up>sit to stand>stand PWR! Up x 10 reps, cues for sequence, technique and control.  Then standing PWR! Moves Flow:  PWR! Up>PWR! Rock>PWR!Twist>PWR! Step x 8 reps, visual cues for sequence and technique.  Disucssed this as option for patient for  part of his HEP.      Self-Care   Self-Care  Other Self-Care Comments    Other Self-Care Comments   Reviewed posture and body mechanics with ADLs.  Pt verbalizes/demo correct technique for squats, lifting, sitting at computer.  Discussed POC, optimal continued fintess through HEP/walking for exercise and plans for d/c today and return for PT screen in 6 months.  Pt verblaizes understanding.               PT Short Term Goals - 10/17/19 1452      PT SHORT TERM GOAL #1   Title  Patient will be independent with HEP to address strength, balance, posture.  TARGET 5 weeks:  10/13/2019    Baseline  Pt reports performing at home, but needs cues for technique. 10/17/2019    Time  5    Period  Weeks    Status  Partially Met    Target Date  10/13/19      PT SHORT TERM GOAL #2   Title  Pt will improve 5x sit<>stand to less than or equal to 10 seconds for improved functional lower extremity strength.    Time  5    Period  Weeks    Status  Achieved    Target Date   10/13/19      PT SHORT TERM GOAL #3   Title  Pt will verbalize at least 3 means to decrease low back pain, for overall improved funcitonal mobility.    Time  5    Period  Weeks    Status  Achieved    Target Date  10/13/19      PT SHORT TERM GOAL #4   Title  Pt will improve TUG/TUG manual score to less than or equal to 10% difference for improved dual tasking with gait.    Time  5    Period  Weeks    Status  Achieved    Target Date  10/13/19        PT Long Term Goals - 11/10/19 1028      PT LONG TERM GOAL #1   Title  Patient will be independent with updated HEP and able to verbalize a plan for appropriate community-based exercise upon discharge from PT. TARGET 11/10/2019    Time  9    Period  Weeks    Status  Achieved      PT LONG TERM GOAL #2   Title  Pt will improve gait velocity to at least 2.62 ft/sec for improved gait efficiency and safety.    Time  9    Period  Weeks    Status  Achieved      PT LONG TERM GOAL #3   Title  Pt will improve 3MWT to at least 385 ft for improved gait efficiency and safety.    Baseline  600 ft in 3 minute walk    Time  9    Period  Weeks    Status  Achieved      PT LONG TERM GOAL #4   Title  Pt will verbalize understanding of posture and body mechanics for decreased back/neck pain, improved participation with functional/work activities.    Time  9    Period  Weeks    Status  Achieved            Plan - 11/10/19 1028    Clinical Impression Statement  Pt has been progressing with PT well during last few sessions.  Assessed LTGs today,  with pt meeting all remaining LTGs.  Discussed and pt agreed to discharge this visit, with educaiton on continued fitness post d/c.    Personal Factors and Comorbidities  Comorbidity 3+    Comorbidities  PMH:  Bell's palsy, HTN, knee pain, PD, esophageal stricture (stretched 08/2019), R chondromalacia patella, R knee OA, GERD    Examination-Activity Limitations  Locomotion Level;Transfers     Examination-Participation Restrictions  Community Activity;Other   Exercise   Stability/Clinical Decision Making  Stable/Uncomplicated    Rehab Potential  Good    PT Frequency  Other (comment)   1x/wk (eval), then 2x/wk for 8 weeks   PT Duration  Other (comment)   Plus eval visit; total POC = 9 weeks   PT Treatment/Interventions  ADLs/Self Care Home Management;Neuromuscular re-education;Balance training;Therapeutic exercise;Therapeutic activities;Functional mobility training;Gait training;Patient/family education    PT Next Visit Plan  Discharge this visit, plan for return screen in 6 months.    Consulted and Agree with Plan of Care  Patient       Patient will benefit from skilled therapeutic intervention in order to improve the following deficits and impairments:  Abnormal gait, Decreased coordination, Difficulty walking, Pain, Decreased balance, Improper body mechanics, Postural dysfunction, Decreased strength, Decreased mobility  Visit Diagnosis: Other symptoms and signs involving the nervous system  Other abnormalities of gait and mobility     Problem List Patient Active Problem List   Diagnosis Date Noted  . Acquired hammer toe of left foot 03/10/2019  . Right lumbar radiculopathy 12/27/2017  . Gastroparesis 07/06/2017  . Orthostatic dizziness 05/12/2016  . Low back pain 05/12/2016  . Chronic constipation 12/17/2015  . Chronic insomnia 12/17/2015  . Left hip pain 12/16/2015  . Parkinson's disease ([place]) 09/11/2015  . HYPERTENSION 11/21/2008  . MURMUR 11/21/2008  . CHEST PAIN, ATYPICAL 11/21/2008    Braelen Sproule W. 11/10/2019, 10:31 AM  Frazier Butt., PT   [facility] [address] [place], [place], [address] Phone: [phone]   Fax:  [phone]  Name: Ruben Silva MRN: [id number] Date of Birth: 06/14/1971   PHYSICAL THERAPY DISCHARGE SUMMARY  Visits from Start of Care: 11  Current  functional level related to goals / functional outcomes: PT Long Term Goals - 11/10/19 1028      PT LONG TERM GOAL #1   Title  Patient will be independent with updated HEP and able to verbalize a plan for appropriate community-based exercise upon discharge from PT. TARGET 11/10/2019    Time  9    Period  Weeks    Status  Achieved      PT LONG TERM GOAL #2   Title  Pt will improve gait velocity to at least 2.62 ft/sec for improved gait efficiency and safety.    Time  9    Period  Weeks    Status  Achieved      PT LONG TERM GOAL #3   Title  Pt will improve 3MWT to at least 385 ft for improved gait efficiency and safety.    Baseline  600 ft in 3 minute walk    Time  9    Period  Weeks    Status  Achieved      PT LONG TERM GOAL #4   Title  Pt will verbalize understanding of posture and body mechanics for decreased back/neck pain, improved participation with functional/work activities.    Time  9    Period  Weeks    Status  Achieved  Pt has met all LTGs   Remaining deficits: Timing/coordination of gait, high level balance   Education / Equipment: Educated in [employer], continueing fitness  Plan: Patient agrees to discharge.  Patient goals were met. Patient is being discharged due to meeting the stated rehab goals.  ?????REcommend PT screen in 6 months due to progressive nature of disease.     Mady Haagensen, PT 11/10/19 10:33 AM Phone: [phone] Fax: [phone]

## 2019-11-10 NOTE — Therapy (Signed)
[facility] [address] [place], [place], [address] Phone: [phone]   Fax:  [phone]  Occupational Therapy Treatment  Patient Details  Name: Ruben Silva MRN: [id number] Date of Birth: 1971/07/07 Referring Provider (OT): Dr. Carles Collet   Encounter Date: 11/10/2019  OT End of Session - 11/10/19 0753    Visit Number  10    Number of Visits  16    Date for OT Re-Evaluation  11/14/19    Authorization Type  UHC -24 visits OT    Authorization - Visit Number  10    Authorization - Number of Visits  24    OT Start Time  [id number]   d/c visit   OT Stop Time  0750    OT Time Calculation (min)  32 min    Activity Tolerance  Patient tolerated treatment well    Behavior During Therapy  [facility] for tasks assessed/performed       Past Medical History:  Diagnosis Date  . Bell's palsy   . Constipation   . Erectile dysfunction   . History of stomach ulcers   . Hypertension   . Insomnia   . Knee pain   . Murmur   . Parkinson disease ([place])   . Parkinsons ([place])   . Weakness generalized     Past Surgical History:  Procedure Laterality Date  . NO PAST SURGERIES      There were no vitals filed for this visit.  Subjective Assessment - 11/10/19 0725    Subjective   Pt agrees with pland for d/c, he has met all goals    Pertinent History  PD since 2016, HTN, heart murmur, MRI from 2019 shows L3-4, L4-5 disc bulge, no nerve involvement, pt reports injury to right elbow(pt states torn ligament)    Patient Stated Goals  improve speed with ADLs    Currently in Pain?  No/denies             Treatment: PWR! Flow x 10 reps each, min v.c with therapist  demonstrating, min v.c Checked progress towards goals Crumpling plastic bag for simulating pulling ip socking, min v.c              OT Education - 11/10/19 0740    Education Details  ways to prevent future complications, safety for exercise and avoiding heavy  overhead lifting due to risk for injury, importance of focus on stretching    Person(s) Educated  Patient    Methods  Explanation;Demonstration;Handout    Comprehension  Verbalized understanding;Returned demonstration       OT Short Term Goals - 11/10/19 0741      OT SHORT TERM GOAL #1   Title  I with PD specific HEP    Time  4    Period  Weeks    Status  Achieved    Target Date  10/15/19      OT SHORT TERM GOAL #2   Title  I with adapted strategies for ADLS/ IADLS in order to maximize safety and independence    Time  4    Period  Weeks    Status  Achieved      OT SHORT TERM GOAL #3   Title  Pt will demonstrate improved LUE fine motor coordination for ADLs as evidenced by decreasing 9 hole peg test score to 30 secs or less.    Baseline  RUE 20.96 LUE 33.28,    Time  4    Period  Weeks  Status  Achieved   20.84 secs     OT SHORT TERM GOAL #4   Title  Pt will verbalize understanding of compensatory strategies for memory/ cognitive deficits and ways to keep thinking skills sharp.    Time  4    Period  Weeks    Status  Achieved        OT Long Term Goals - 11/10/19 0742      OT LONG TERM GOAL #1   Title  Pt will verbalize understanding of ways to prevent future PD related complications.-11/14/19    Time  8    Period  Weeks    Status  Achieved      OT LONG TERM GOAL #2   Title  Pt will demonstrate increased fine motor coordination for ADLs as evidenced by decreasing 3 button/ unbutton test to 43 secs or less.    Baseline  48 secs    Time  8    Period  Weeks    Status  Achieved   23.66 secs     OT LONG TERM GOAL #3   Title  Pt will demonstrate ability to retrieve a lightweight object from overhead shelf with -15 elbow  extension fo RUE.    Baseline  RUE -20(Pt reports injuring elbow), LUE -5    Time  8    Period  Weeks    Status  Achieved   0     OT LONG TERM GOAL #4   Title  Pt will perform all basic ADLs modified indpendently.    Baseline  requires  occasional min A form son with pants and shirt    Time  8    Period  Weeks    Status  Achieved        OCCUPATIONAL THERAPY DISCHARGE SUMMARY    Current functional level related to goals / functional outcomes: Pt met all goals.   Remaining deficits: Decreased coordination and timing of movement, cognitive deficits, decreased functional mobility,    Education / Equipment: Pt was educated regarding ways to prevent future PD related complications, HEP,compensatory strategies for short term emory and keeping thinking skills sharp,  big movements with ADLS as well as importance of controlled movements. Pt verbalized  understanding of all education. Plan: Patient agrees to discharge.  Patient goals were met. Patient is being discharged due to meeting the stated rehab goals.  ?????         Plan - 11/10/19 0755    Clinical Impression Statement  Pt has achieved all goals, he agrees with plans for d/c.    OT Occupational Profile and History  Detailed Assessment- Review of Records and additional review of physical, cognitive, psychosocial history related to current functional performance    Occupational performance deficits (Please refer to evaluation for details):  ADL's;IADL's;Rest and Sleep;Work;Leisure;Social Participation;Play    Body Structure / Function / Physical Skills  ADL;Endurance;UE functional use;Balance;Flexibility;Pain;FMC;Vision;Gait;ROM;GMC;Coordination;Decreased knowledge of precautions;IADL;Decreased knowledge of use of DME;Dexterity;Strength;Mobility;Tone    Cognitive Skills  Attention;Emotional;Energy/Drive;Memory;Perception;Problem Solve;Safety Awareness;Sequencing;Thought;Understand    Rehab Potential  Good    Clinical Decision Making  Limited treatment options, no task modification necessary    Comorbidities Affecting Occupational Performance:  May have comorbidities impacting occupational performance    Modification or Assistance to Complete Evaluation   No  modification of tasks or assist necessary to complete eval    OT Frequency  2x / week    OT Duration  8 weeks    OT Treatment/Interventions  Self-care/ADL training;Energy conservation;Aquatic Therapy;DME  and/or AE instruction;Ultrasound;Patient/family education;Passive range of motion;Balance training;Cryotherapy;Fluidtherapy;Splinting;Therapist, nutritional;Therapeutic activities;Manual Therapy;Therapeutic exercise;Moist Heat;Neuromuscular education;Cognitive remediation/compensation;Visual/perceptual remediation/compensation    Plan  d/c OT pt agrees with plans for d/c    Consulted and Agree with Plan of Care  Patient       Patient will benefit from skilled therapeutic intervention in order to improve the following deficits and impairments:   Body Structure / Function / Physical Skills: ADL, Endurance, UE functional use, Balance, Flexibility, Pain, FMC, Vision, Gait, ROM, GMC, Coordination, Decreased knowledge of precautions, IADL, Decreased knowledge of use of DME, Dexterity, Strength, Mobility, Tone Cognitive Skills: Attention, Emotional, Energy/Drive, Memory, Perception, Problem Solve, Safety Awareness, Sequencing, Thought, Understand     Visit Diagnosis: Frontal lobe and executive function deficit  Attention and concentration deficit  Other symptoms and signs involving the musculoskeletal system  Other symptoms and signs involving the nervous system  Other abnormalities of gait and mobility  Abnormal posture  Other lack of coordination    Problem List Patient Active Problem List   Diagnosis Date Noted  . Acquired hammer toe of left foot 03/10/2019  . Right lumbar radiculopathy 12/27/2017  . Gastroparesis 07/06/2017  . Orthostatic dizziness 05/12/2016  . Low back pain 05/12/2016  . Chronic constipation 12/17/2015  . Chronic insomnia 12/17/2015  . Left hip pain 12/16/2015  . Parkinson's disease ([place]) 09/11/2015  . HYPERTENSION 11/21/2008  . MURMUR 11/21/2008  .  CHEST PAIN, ATYPICAL 11/21/2008    Decie Verne 11/10/2019, 7:56 AM  [facility] [address] [place], [place], [address] Phone: [phone]   Fax:  [phone]  Name: Cheryl Stabenow MRN: [id number] Date of Birth: 05/16/1971

## 2019-11-10 NOTE — Patient Instructions (Signed)

## 2019-11-11 ENCOUNTER — Ambulatory Visit: Payer: 59 | Attending: Internal Medicine

## 2019-11-11 DIAGNOSIS — Z23 Encounter for immunization: Secondary | ICD-10-CM

## 2019-11-11 NOTE — Progress Notes (Signed)
   Covid-19 Vaccination Clinic  Name:  Jermani Eberlein    MRN: [id number] DOB: [birth year]  11/11/2019  Mr. Popov was observed post Covid-19 immunization for 15 minutes without incident. He was provided with Vaccine Information Sheet and instruction to access the V-Safe system.   Mr. Fesler was instructed to call 911 with any severe reactions post vaccine: Marland Kitchen Difficulty breathing  . Swelling of face and throat  . A fast heartbeat  . A bad rash all over body  . Dizziness and weakness   Immunizations Administered    Name Date Dose VIS Date Route   Pfizer COVID-19 Vaccine 11/11/2019  2:22 PM 0.3 mL 08/11/2019 Intramuscular   Manufacturer: [employer], [employer]   Lot: [id number]   NDC: [address]-6

## 2019-11-15 ENCOUNTER — Encounter: Payer: 59 | Admitting: Occupational Therapy

## 2019-11-15 ENCOUNTER — Ambulatory Visit: Payer: 59 | Admitting: Physical Therapy

## 2019-11-17 ENCOUNTER — Ambulatory Visit (INDEPENDENT_AMBULATORY_CARE_PROVIDER_SITE_OTHER): Payer: 59 | Admitting: Psychology

## 2019-11-17 ENCOUNTER — Encounter: Payer: 59 | Admitting: Occupational Therapy

## 2019-11-17 ENCOUNTER — Ambulatory Visit: Payer: 59 | Admitting: Physical Therapy

## 2019-11-17 ENCOUNTER — Ambulatory Visit: Payer: 59

## 2019-11-17 DIAGNOSIS — F4321 Adjustment disorder with depressed mood: Secondary | ICD-10-CM | POA: Diagnosis not present

## 2019-11-22 ENCOUNTER — Encounter: Payer: 59 | Admitting: Occupational Therapy

## 2019-11-22 ENCOUNTER — Ambulatory Visit: Payer: 59 | Admitting: Physical Therapy

## 2019-11-24 ENCOUNTER — Encounter: Payer: 59 | Admitting: Occupational Therapy

## 2019-11-24 ENCOUNTER — Other Ambulatory Visit: Payer: Self-pay

## 2019-11-24 ENCOUNTER — Ambulatory Visit: Payer: 59

## 2019-11-24 ENCOUNTER — Ambulatory Visit: Payer: 59 | Admitting: Physical Therapy

## 2019-11-24 DIAGNOSIS — R471 Dysarthria and anarthria: Secondary | ICD-10-CM

## 2019-11-24 DIAGNOSIS — R2689 Other abnormalities of gait and mobility: Secondary | ICD-10-CM | POA: Diagnosis not present

## 2019-11-24 DIAGNOSIS — R1312 Dysphagia, oropharyngeal phase: Secondary | ICD-10-CM

## 2019-11-24 DIAGNOSIS — R41841 Cognitive communication deficit: Secondary | ICD-10-CM

## 2019-11-24 NOTE — Therapy (Signed)
[facility] [address] [place], [place], [address] Phone: [phone]   Fax:  [phone]  Speech Language Pathology Treatment/Renewal   Patient Details  Name: Ruben Silva MRN: [id number] Date of Birth: [birth year] Referring Provider (SLP): Mindi Curling. PA-C (due to pt PD no longer managed with Dr. Alfonso Patten. Tat, DO)   Encounter Date: 11/24/2019  End of Session - 11/24/19 0955    Visit Number  8    Number of Visits  17    Date for SLP Re-Evaluation  12/29/19    Authorization - Visit Number  7    Authorization - Number of Visits  20    SLP Start Time  0811   10 minutes late   SLP Stop Time   0850    SLP Time Calculation (min)  39 min    Activity Tolerance  Patient tolerated treatment well       Past Medical History:  Diagnosis Date  . Bell's palsy   . Constipation   . Erectile dysfunction   . History of stomach ulcers   . Hypertension   . Insomnia   . Knee pain   . Murmur   . Parkinson disease ([place])   . Parkinsons ([place])   . Weakness generalized     Past Surgical History:  Procedure Laterality Date  . NO PAST SURGERIES      There were no vitals filed for this visit.  Subjective Assessment - 11/24/19 0825    Subjective  "[name], the [place] manufacturer stopped manufacturing the medicine in there that stops the tremors."    Currently in Pain?  No/denies            ADULT SLP TREATMENT - 11/24/19 0826      General Information   Behavior/Cognition  Alert;Cooperative;Pleasant mood      Treatment Provided   Treatment provided  Cognitive-Linquistic      Cognitive-Linquistic Treatment   Treatment focused on  Dysarthria;Cognition    Skilled Treatment  "Now the medicine is kicking in [place]. (8:25AM) I took it about 30 minutes ago." "I used to take the meds every 3 hours now I take them every hour -hour and a half."  Pt produced loud /a/ average low to mid 90s dB. [place] description task  completed with usual min-mod SLP cues faded to occasional min cues. Conversation afterwards at low-mid 60s dB, and only incr'd to mid 60s dB after SLP cues. Pt with quick loudness decay back to low-mid 60s after approx 2 minutes of simple conversation. SLP used this opportunity to encourage pt to practice louder speech via homework provided and SLP explained rationale for this.       Assessment / Recommendations / Plan   Plan  Other (Comment)   goals downgraded     Progression Toward Goals   Progression toward goals  --   pt will have difficulty meeting his goal at [employer] - 11/24/19 0955    Education Details  ST homework important to complete if pt wants to have WNL volume in conversation    Person(s) Educated  Patient    Methods  Explanation    Comprehension  Verbalized understanding       SLP Short Term Goals - 11/10/19 0825      SLP SHORT TERM GOAL #1   Title  Pt will generate loud /a/ or extended "hey!" with average of low 90s dB over 4 sessions  Baseline  10/03/19, 10-20-19    Status  Partially Met      SLP SHORT TERM GOAL #2   Title  Pt will use speech volume average low 70sdB when responding with sentences 18/20 over 3 sessions    Status  Not Met      SLP SHORT TERM GOAL #3   Title  pt will use abdominal breathing at rest 70% of the time over two sessions    Status  Partially Met      SLP SHORT TERM GOAL #4   Title  in 5-7 minutes simple conversation pt will generate speech volume of low 70s over 3 sessions    Status  Not Met      SLP SHORT TERM GOAL #5   Title  pt will complete cognitive linguistic testing in the first 1-2 sessions    Baseline  CLQT 09/26/19    Status  Achieved      SLP SHORT TERM GOAL #6   Title  Patient will use at least one compensatory strategy for memory or attention in 2 therapy sessions.    Status  Not Met      SLP SHORT TERM GOAL #7   Title  Patient will ID when impulsivity negatively impacts his performance in  cognitive tasks in 75% of opportunities x 2 sessions.    Status  Not Met       SLP Long Term Goals - 11/24/19 1000      SLP LONG TERM GOAL #1   Title  Pt will generate loud /a/ with average of low-mid 90s dB over 4 total sessions    Baseline  10-20-19, 11-03-19, 11-10-19    Time  --   LTGs renewed 11-24-19   Period  --   or 17 total sessions; for all LTGs   Status  Achieved      SLP LONG TERM GOAL #2   Title  pt will use abdominal breathing 65% of the time in 5 minutes conversation over 3 sessions    Time  4    Period  Weeks    Status  On-going      SLP LONG TERM GOAL #3   Title  in 10 minutes simple conversation pt will generate speech volume of low 70s with nonverbal cues, over 3 sessions    Time  4    Period  Weeks    Status  Revised      SLP LONG TERM GOAL #4   Title  pt will follow swallow precautions with POs in 3 sessions with modified independence    Time  4    Period  Weeks    Status  On-going      SLP LONG TERM GOAL #5   Title  Patient will report use of memory compensation system to recall/manage tasks at work outside of [place] between 4 sessions.    Baseline  11-10-19    Time  4    Period  Weeks    Status  Deferred   pt on STD, possibly LTD (TBD)     SLP LONG TERM GOAL #6   Title  Pt will use slow rate and double checking to decrease errors in functional tasks (95% accuracy with double checking).    Time  4    Period  Weeks    Status  On-going       Plan - 11/24/19 5681    Clinical Impression Statement  Patient presents with vocal volume at low-mid 60s  dB upon entering Comfort room, talking about his PD med being discontinued(?). Functional cognitive communication deficits noted including recall, attention/ attention to detail, impulsivity, and executive function skills. Pt would ocnt to benefit from skilled ST targeting incr'd speech volume, adherence to aspiration precations, and improving pt's cognitive communication skils. Pt only scheduled himself once a week ST  despite being told rationale for SLP-recommended frequency of x2/week.    Speech Therapy Frequency  2x / week    Duration  --   8 weeks or 17 total sessions; pt allowed 24 ST/year so may end early to conserve visits   Treatment/Interventions  Aspiration precaution training;Pharyngeal strengthening exercises;Diet toleration management by SLP;Internal/external aids;Patient/family education;Cognitive reorganization;Compensatory strategies;SLP instruction and feedback;Functional tasks;Environmental controls;Oral motor exercises    Potential to Achieve Goals  Good    Potential Considerations  Ability to learn/carryover information;Severity of impairments    Consulted and Agree with Plan of Care  Patient       Patient will benefit from skilled therapeutic intervention in order to improve the following deficits and impairments:   No diagnosis found.    Problem List Patient Active Problem List   Diagnosis Date Noted  . Acquired hammer toe of left foot 03/10/2019  . Right lumbar radiculopathy 12/27/2017  . Gastroparesis 07/06/2017  . Orthostatic dizziness 05/12/2016  . Low back pain 05/12/2016  . Chronic constipation 12/17/2015  . Chronic insomnia 12/17/2015  . Left hip pain 12/16/2015  . Parkinson's disease ([place]) 09/11/2015  . HYPERTENSION 11/21/2008  . MURMUR 11/21/2008  . CHEST PAIN, ATYPICAL 11/21/2008    [facility] 11/24/2019, 10:03 AM  [facility] [address] [place], [place], [address] Phone: [phone]   Fax:  [phone]   Name: Sacramento Monds MRN: [id number] Date of Birth: [birth year]

## 2019-11-24 NOTE — Patient Instructions (Signed)
  Please complete the assigned speech therapy homework prior to your next session and return it to the speech therapist at your next visit.  

## 2019-12-05 ENCOUNTER — Ambulatory Visit: Payer: 59 | Attending: Internal Medicine

## 2019-12-05 DIAGNOSIS — Z23 Encounter for immunization: Secondary | ICD-10-CM

## 2019-12-05 NOTE — Progress Notes (Signed)
   Covid-19 Vaccination Clinic  Name:  Adama Ferber    MRN: [id number] DOB: 1971/02/09  12/05/2019  Mr. Devins was observed post Covid-19 immunization for 15 minutes without incident. He was provided with Vaccine Information Sheet and instruction to access the V-Safe system.   Mr. Bunt was instructed to call 911 with any severe reactions post vaccine: Marland Kitchen Difficulty breathing  . Swelling of face and throat  . A fast heartbeat  . A bad rash all over body  . Dizziness and weakness   Immunizations Administered    Name Date Dose VIS Date Route   Pfizer COVID-19 Vaccine 12/05/2019  4:42 PM 0.3 mL 08/11/2019 Intramuscular   Manufacturer: [employer], [employer]   Lot: [id number]   NDC: [address]-7

## 2020-02-29 ENCOUNTER — Other Ambulatory Visit: Payer: Self-pay | Admitting: Neurology

## 2020-06-24 ENCOUNTER — Ambulatory Visit (INDEPENDENT_AMBULATORY_CARE_PROVIDER_SITE_OTHER): Payer: 59 | Admitting: Psychology

## 2020-06-24 DIAGNOSIS — F4321 Adjustment disorder with depressed mood: Secondary | ICD-10-CM | POA: Diagnosis not present

## 2020-07-08 ENCOUNTER — Ambulatory Visit (INDEPENDENT_AMBULATORY_CARE_PROVIDER_SITE_OTHER): Payer: 59 | Admitting: Psychology

## 2020-07-08 DIAGNOSIS — F4321 Adjustment disorder with depressed mood: Secondary | ICD-10-CM

## 2020-07-22 ENCOUNTER — Ambulatory Visit: Payer: 59 | Admitting: Psychology

## 2020-07-29 ENCOUNTER — Ambulatory Visit: Payer: 59 | Admitting: Psychology

## 2020-07-30 ENCOUNTER — Ambulatory Visit: Payer: Self-pay | Admitting: Psychology

## 2020-08-26 ENCOUNTER — Ambulatory Visit (INDEPENDENT_AMBULATORY_CARE_PROVIDER_SITE_OTHER): Payer: 59 | Admitting: Psychology

## 2020-08-26 DIAGNOSIS — F4321 Adjustment disorder with depressed mood: Secondary | ICD-10-CM | POA: Diagnosis not present

## 2020-09-09 ENCOUNTER — Emergency Department: Payer: BC Managed Care – PPO

## 2020-09-09 ENCOUNTER — Ambulatory Visit: Payer: 59 | Admitting: Psychology

## 2020-09-09 ENCOUNTER — Emergency Department
Admission: EM | Admit: 2020-09-09 | Discharge: 2020-09-09 | Disposition: A | Discharge status: Home / Self Care | Payer: BC Managed Care – PPO | Attending: Emergency Medicine | Admitting: Emergency Medicine

## 2020-09-09 ENCOUNTER — Other Ambulatory Visit: Payer: Self-pay

## 2020-09-09 ENCOUNTER — Encounter: Payer: Self-pay | Admitting: Emergency Medicine

## 2020-09-09 DIAGNOSIS — Z5321 Procedure and treatment not carried out due to patient leaving prior to being seen by health care provider: Secondary | ICD-10-CM | POA: Insufficient documentation

## 2020-09-09 DIAGNOSIS — M79604 Pain in right leg: Secondary | ICD-10-CM | POA: Insufficient documentation

## 2020-09-09 DIAGNOSIS — R4182 Altered mental status, unspecified: Secondary | ICD-10-CM | POA: Diagnosis not present

## 2020-09-09 DIAGNOSIS — R0789 Other chest pain: Secondary | ICD-10-CM | POA: Diagnosis not present

## 2020-09-09 LAB — ACETAMINOPHEN LEVEL: Acetaminophen (Tylenol), Serum: <10 ug/mL — ABNORMAL LOW (ref 10–30)

## 2020-09-09 LAB — COMPREHENSIVE METABOLIC PANEL
ALT: 6 U/L (ref 0–44)
AST: 25 U/L (ref 15–41)
Albumin: 4.3 g/dL (ref 3.5–5.0)
Alkaline Phosphatase: 35 U/L — ABNORMAL LOW (ref 38–126)
Anion gap: 12 (ref 5–15)
BUN: 15 mg/dL (ref 6–20)
CO2: 28 mmol/L (ref 22–32)
Calcium: 9.4 mg/dL (ref 8.9–10.3)
Chloride: 99 mmol/L (ref 98–111)
Creatinine, Ser: 1.1 mg/dL (ref 0.61–1.24)
GFR, Estimated: >60 mL/min (ref >60)
Glucose, Bld: 106 mg/dL — ABNORMAL HIGH (ref 70–99)
Potassium: 4.6 mmol/L (ref 3.5–5.1)
Sodium: 139 mmol/L (ref 135–145)
Total Bilirubin: 0.9 mg/dL (ref 0.3–1.2)
Total Protein: 7.6 g/dL (ref 6.5–8.1)

## 2020-09-09 LAB — CBC
HCT: 45.5 % (ref 39.0–52.0)
Hemoglobin: 15 g/dL (ref 13.0–17.0)
MCH: 27.5 pg (ref 26.0–34.0)
MCHC: 33 g/dL (ref 30.0–36.0)
MCV: 83.3 fL (ref 80.0–100.0)
Platelets: 253 10*3/uL (ref 150–400)
RBC: 5.46 MIL/uL (ref 4.22–5.81)
RDW: 12.5 % (ref 11.5–15.5)
WBC: 5.9 10*3/uL (ref 4.0–10.5)
nRBC: 0 % (ref 0.0–0.2)

## 2020-09-09 LAB — SALICYLATE LEVEL: Salicylate Lvl: <7 mg/dL — ABNORMAL LOW (ref 7.0–30.0)

## 2020-09-09 LAB — TROPONIN I (HIGH SENSITIVITY): Troponin I (High Sensitivity): 5 ng/L (ref <18)

## 2020-09-09 LAB — ETHANOL: Alcohol, Ethyl (B): <10 mg/dL (ref <10)
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
## 2020-12-11 ENCOUNTER — Ambulatory Visit: Payer: Self-pay | Admitting: Podiatry

## 2021-01-06 ENCOUNTER — Ambulatory Visit (INDEPENDENT_AMBULATORY_CARE_PROVIDER_SITE_OTHER): Payer: BC Managed Care – PPO | Admitting: Psychology

## 2021-01-06 DIAGNOSIS — F4321 Adjustment disorder with depressed mood: Secondary | ICD-10-CM | POA: Diagnosis not present

## 2021-01-20 ENCOUNTER — Ambulatory Visit: Payer: BC Managed Care – PPO | Admitting: Psychology

## 2021-03-29 ENCOUNTER — Other Ambulatory Visit: Payer: Self-pay

## 2021-03-29 ENCOUNTER — Encounter (HOSPITAL_COMMUNITY): Payer: Self-pay

## 2021-03-29 ENCOUNTER — Ambulatory Visit (HOSPITAL_COMMUNITY)
Admission: EM | Admit: 2021-03-29 | Discharge: 2021-03-29 | Disposition: A | Discharge status: Home / Self Care | Payer: BC Managed Care – PPO | Attending: Internal Medicine | Admitting: Internal Medicine

## 2021-03-29 ENCOUNTER — Ambulatory Visit (INDEPENDENT_AMBULATORY_CARE_PROVIDER_SITE_OTHER): Payer: BC Managed Care – PPO

## 2021-03-29 ENCOUNTER — Ambulatory Visit (HOSPITAL_COMMUNITY): Payer: BC Managed Care – PPO

## 2021-03-29 DIAGNOSIS — W19XXXA Unspecified fall, initial encounter: Secondary | ICD-10-CM

## 2021-03-29 DIAGNOSIS — S60222A Contusion of left hand, initial encounter: Secondary | ICD-10-CM | POA: Diagnosis not present

## 2021-03-29 DIAGNOSIS — M79642 Pain in left hand: Secondary | ICD-10-CM | POA: Diagnosis not present

## 2021-03-29 MED ORDER — NAPROXEN 500 MG PO TABS: 500.0000 mg | ORAL_TABLET | Freq: Two times a day (BID) | ORAL | 0 refills | Status: AC | PRN

## 2021-03-29 NOTE — ED Triage Notes (Signed)
Pt reports left hand and left wrist pain since 1530 today. States he feel over left hand.

## 2021-03-29 NOTE — ED Provider Notes (Signed)
[facility]    CSN: [id number] Arrival date & time: 03/29/21  1751      History   Chief Complaint Chief Complaint  Patient presents with   Hand Pain    HPI Jacobey Gura is a 50 y.o. male.   Patient presenting today with left hand pain and swelling after a fall onto the hand earlier today.  States there is some swelling and pain in the third MCP and he cannot lift his middle finger.  Denies discoloration, skin abrasions, other injuries from the fall, numbness, tingling.  Has not taken anything for pain.   Past Medical History:  Diagnosis Date   Bell's palsy    Constipation    Erectile dysfunction    History of stomach ulcers    Hypertension    Insomnia    Knee pain    Murmur    Parkinson disease ([facility])    Parkinsons ([facility])    Weakness generalized     Patient Active Problem List   Diagnosis Date Noted   Acquired hammer toe of left foot 03/10/2019   Right lumbar radiculopathy 12/27/2017   Gastroparesis 07/06/2017   Orthostatic dizziness 05/12/2016   Low back pain 05/12/2016   Chronic constipation 12/17/2015   Chronic insomnia 12/17/2015   Left hip pain 12/16/2015   Parkinson's disease ([facility]) 09/11/2015   HYPERTENSION 11/21/2008   MURMUR 11/21/2008   CHEST PAIN, ATYPICAL 11/21/2008    Past Surgical History:  Procedure Laterality Date   NO PAST SURGERIES         Home Medications    Prior to Admission medications   Medication Sig Start Date End Date Taking? Authorizing Provider  AMITIZA 24 MCG capsule Take 24 mcg by mouth daily as needed for constipation. 10/14/18   [provider]  carbidopa-levodopa-entacapone (STALEVO) 50-200-200 MG tablet TAKE 1 TABLET BY MOUTH 5 TIMES A DAY/EVERY 3 HOURS. 02/20/19   Levert Feinstein, MD  clonazePAM (KLONOPIN) 1 MG tablet Take 1 tablet (1 mg total) by mouth at bedtime. Please call [phone] to schedule yearly appt. 05/22/19   Levert Feinstein, MD  diclofenac (VOLTAREN) 75 MG EC tablet Take 75 mg by mouth 2  (two) times daily.    [provider]  losartan (COZAAR) 50 MG tablet TAKE ONE TABLET (50 MG DOSE) BY MOUTH 2 (TWO) TIMES DAILY. FOR BLOOD PRESSURE 02/27/19   [provider]  naproxen (NAPROSYN) 500 MG tablet Take 1 tablet (500 mg total) by mouth 2 (two) times daily as needed. 03/29/21   Particia Nearing, PA-C  propranolol (INDERAL) 40 MG tablet TAKE 2 TABLETS TWICE A DAY 10/18/18   Levert Feinstein, MD  rotigotine (NEUPRO) 6 MG/24HR Place 1 patch onto the skin daily. 05/25/19   Tat, Octaviano Batty, DO  sildenafil (REVATIO) 20 MG tablet Take 1 tablet (20 mg total) by mouth 3 (three) times daily. 01/10/18   Sater, Pearletha Furl, MD  tadalafil (CIALIS) 20 MG tablet TAKE 1 TABLET DAILY AS NEEDED FOR ERECTILE DYSFUNCTION 02/27/19   [provider]    Family History Family History  Problem Relation Age of Onset   Hypertension Mother 20   Liver disease Mother    Hypercholesterolemia Mother    Heart attack Mother    Stroke Father    Hypertension Father    Diabetes Father    Dementia Father    Diabetes Sister     Social History Social History   Tobacco Use   Smoking status: Never   Smokeless tobacco:  Never  Vaping Use   Vaping Use: Never used  Substance Use Topics   Alcohol use: Never   Drug use: Never     Allergies   Patient has no known allergies.   Review of Systems Review of Systems Per HPI  Physical Exam Triage Vital Signs ED Triage Vitals  Enc Vitals Group     BP 03/29/21 1850 (!) 163/105     Pulse Rate 03/29/21 1850 78     Resp 03/29/21 1850 18     Temp 03/29/21 1850 98.2 F (36.8 C)     Temp Source 03/29/21 1850 Oral     SpO2 03/29/21 1850 99 %     Weight --      Height --      Head Circumference --      Peak Flow --      Pain Score 03/29/21 1849 10     Pain Loc --      Pain Edu? --      Excl. in GC? --    No data found.  Updated Vital Signs BP (!) 163/105 (BP Location: Right Arm)   Pulse 78   Temp 98.2 F (36.8 C) (Oral)   Resp 18    SpO2 99%   Visual Acuity Right Eye Distance:   Left Eye Distance:   Bilateral Distance:    Right Eye Near:   Left Eye Near:    Bilateral Near:     Physical Exam Vitals and nursing note reviewed.  Constitutional:      Appearance: Normal appearance.  HENT:     Head: Atraumatic.  Eyes:     Extraocular Movements: Extraocular movements intact.     Conjunctiva/sclera: Conjunctivae normal.  Cardiovascular:     Rate and Rhythm: Normal rate and regular rhythm.     Pulses: Normal pulses.  Pulmonary:     Effort: Pulmonary effort is normal.     Breath sounds: Normal breath sounds.  Musculoskeletal:        General: Tenderness and signs of injury present. No swelling or deformity. Normal range of motion.     Cervical back: Normal range of motion and neck supple.     Comments: Minimal edema surrounding left third MCP.  Minimal range of motion with effort of left hand diffusely, unclear what his baseline is as he does have Parkinson's disease.  Skin:    General: Skin is warm and dry.     Findings: No bruising, erythema or lesion.  Neurological:     General: No focal deficit present.     Mental Status: He is oriented to person, place, and time.  Psychiatric:        Mood and Affect: Mood normal.        Thought Content: Thought content normal.        Judgment: Judgment normal.     UC Treatments / Results  Labs (all labs ordered are listed, but only abnormal results are displayed) Labs Reviewed - No data to display  EKG   Radiology DG Hand Complete Left  Result Date: 03/29/2021 CLINICAL DATA:  Larey Seat today onto left hand, pain in 3rd MCP joint. EXAM: LEFT HAND - COMPLETE 3+ VIEW COMPARISON:  None. FINDINGS: There is no evidence of fracture or dislocation. There is no evidence of arthropathy or other focal bone abnormality. Soft tissues are unremarkable. IMPRESSION: Negative. Electronically Signed   By: Emmaline Kluver M.D.   On: 03/29/2021 18:30    Procedures Procedures  (including critical care  time)  Medications Ordered in UC Medications - No data to display  Initial Impression / Assessment and Plan / UC Course  I have reviewed the triage vital signs and the nursing notes.  Pertinent labs & imaging results that were available during my care of the patient were reviewed by me and considered in my medical decision making (see chart for details).     No bony deformities palpable on exam, x-ray of the left hand negative for bony injury or abnormality.  RICE protocol reviewed, naproxen sent.  Follow-up with orthopedics if not fully resolving.  Final Clinical Impressions(s) / UC Diagnoses   Final diagnoses:  Contusion of left hand, initial encounter  Fall, initial encounter   Discharge Instructions   None    ED Prescriptions     Medication Sig Dispense Auth. Provider   naproxen (NAPROSYN) 500 MG tablet Take 1 tablet (500 mg total) by mouth 2 (two) times daily as needed. 30 tablet Particia Nearing, [place]      PDMP not reviewed this encounter.   Particia Nearing, [place] 03/29/21 1906

## 2021-04-18 ENCOUNTER — Other Ambulatory Visit: Payer: Self-pay | Admitting: Family Medicine

## 2021-05-22 ENCOUNTER — Other Ambulatory Visit: Payer: Self-pay | Admitting: Family Medicine

## 2021-05-22 NOTE — Telephone Encounter (Signed)
   Notes to clinic Not a pt in this practice.  

## 2021-05-24 ENCOUNTER — Other Ambulatory Visit: Payer: Self-pay | Admitting: Family Medicine

## 2021-05-25 ENCOUNTER — Other Ambulatory Visit: Payer: Self-pay | Admitting: Family Medicine

## 2021-05-25 NOTE — Telephone Encounter (Signed)
R . Lane , PA no longer at [facility] 

## 2021-12-17 ENCOUNTER — Ambulatory Visit: Payer: BC Managed Care – PPO | Admitting: Podiatry

## 2021-12-30 ENCOUNTER — Ambulatory Visit: Payer: BC Managed Care – PPO | Admitting: Podiatry

## 2021-12-31 ENCOUNTER — Encounter: Payer: Self-pay | Admitting: Podiatry

## 2021-12-31 ENCOUNTER — Ambulatory Visit (INDEPENDENT_AMBULATORY_CARE_PROVIDER_SITE_OTHER): Payer: BC Managed Care – PPO | Admitting: Podiatry

## 2021-12-31 DIAGNOSIS — M79675 Pain in left toe(s): Secondary | ICD-10-CM

## 2021-12-31 DIAGNOSIS — M79674 Pain in right toe(s): Secondary | ICD-10-CM | POA: Diagnosis not present

## 2021-12-31 DIAGNOSIS — B351 Tinea unguium: Secondary | ICD-10-CM | POA: Diagnosis not present

## 2021-12-31 NOTE — Progress Notes (Signed)
This patient returns to my office for at risk foot care.  This patient requires this care by a professional since this patient will be at risk due to having  Parkinsons ?This patient is unable to cut nails himself since the patient cannot reach his nails.These nails are painful walking and wearing shoes.  This patient presents for at risk foot care today. ? ?General Appearance  Alert, conversant and in no acute stress. ? ?Vascular  Dorsalis pedis and posterior tibial  pulses are palpable  bilaterally.  Capillary return is within normal limits  bilaterally. Temperature is within normal limits  bilaterally. ? ?Neurologic  Senn-Weinstein monofilament wire test within normal limits  bilaterally. Muscle power within normal limits bilaterally. ? ?Nails Thick disfigured discolored nails with subungual debris  hallux bilaterally. No evidence of bacterial infection or drainage bilaterally. ? ?Orthopedic  No limitations of motion  feet .  No crepitus or effusions noted.  No bony pathology or digital deformities noted. ? ?Skin  normotropic skin with no porokeratosis noted bilaterally.  No signs of infections or ulcers noted.    ? ?Onychomycosis  Pain in right toes  Pain in left toes ? ?Consent was obtained for treatment procedures.   Mechanical debridement of nails 1-5  bilaterally performed with a nail nipper.  Filed with dremel without incident.  ? ? ?Return office visit    prn                 Told patient to return for periodic foot care and evaluation due to potential at risk complications. ? ? ?Helane Gunther DPM   ?

## 2022-01-27 ENCOUNTER — Encounter: Payer: Self-pay | Admitting: Neurology

## 2022-02-26 ENCOUNTER — Institutional Professional Consult (permissible substitution): Payer: BC Managed Care – PPO | Admitting: Neurology

## 2022-03-31 ENCOUNTER — Encounter: Payer: Self-pay | Admitting: *Deleted

## 2022-04-01 ENCOUNTER — Encounter: Payer: Self-pay | Admitting: Neurology

## 2022-04-01 ENCOUNTER — Ambulatory Visit (INDEPENDENT_AMBULATORY_CARE_PROVIDER_SITE_OTHER): Payer: BC Managed Care – PPO | Admitting: Neurology

## 2022-04-01 VITALS — BP 102/66 | HR 60 | Ht 68.0 in | Wt 245.0 lb

## 2022-04-01 DIAGNOSIS — R351 Nocturia: Secondary | ICD-10-CM | POA: Diagnosis not present

## 2022-04-01 DIAGNOSIS — Z9189 Other specified personal risk factors, not elsewhere classified: Secondary | ICD-10-CM

## 2022-04-01 DIAGNOSIS — G4700 Insomnia, unspecified: Secondary | ICD-10-CM

## 2022-04-01 DIAGNOSIS — G4719 Other hypersomnia: Secondary | ICD-10-CM

## 2022-04-01 DIAGNOSIS — R519 Headache, unspecified: Secondary | ICD-10-CM

## 2022-04-01 NOTE — Patient Instructions (Signed)

## 2022-04-01 NOTE — Progress Notes (Signed)
Subjective:    Patient ID: Ruben Silva is a 51 y.o. male.  HPI    Huston Foley, MD, PhD [facility] [address] [address] [place], [place] [address]  Dear Maralyn Sago,   I saw your patient, Ruben Silva, upon your kind request in my sleep clinic today for initial consultation of his sleep disorder, in particular, concern for underlying obstructive sleep apnea.  The patient is unaccompanied today.  As you know, Ruben Silva is a 51 year old right-handed gentleman with an underlying medical history of Parkinson's disease (followed by [name] neurology, previously seen by [facility] neurology, also seen by [name] at [facility] and also seen by Dr. Arbutus Leas at [facility]), Bell's palsy, chronic kidney disease, hypertension, knee pain, arthritis, and mild obesity, who reports excessive daytime somnolence and difficulty initiating as well as maintaining sleep. I reviewed your office note from 01/02/2022.  Of note, he is on multiple medications, including several potentially sedating medications, including clonazepam 1 mg strength at bedtime, but reports not taking BuSpar 10 mg twice daily any longer. He is on baclofen 5 to 10 mg 3 times daily as needed, hydroxyzine 50 mg at bedtime as needed. He is no longer on Neupro. He  is on propranolol 40 mg twice daily and quetiapine 25 mg at bedtime as needed, as well as trazodone 100 mg at bedtime.  Full medication list as below.  His Epworth sleepiness score is 7 out of 24, fatigue severity score is 44 out of 63.  He is not sure if he snores, he is divorced and lives alone, he has 1 grown child.  He does not work.  He is a non-smoker and does not drink alcohol.  He does not drink caffeine on a day-to-day basis.  He is not aware of any family history of sleep apnea.  He is supposed to get a Holter monitor as I understand. He goes to bed around midnight.  He fairly consistently takes clonazepam, hydroxyzine, Seroquel and trazodone at bedtime.  He  does not sleep through the night.  He does not wake up rested.  He has nocturia about twice per average night and has had recurrent morning headaches.  He takes ibuprofen 1000 mg every other day approximately.  He is a restless sleeper.  He denies vivid dreams or acting out in his dreams, he has never fallen out of bed.  He is not aware of any family history of sleep apnea.  He is supposed to get a heart monitor in September.  His Past Medical History Is Significant For: Past Medical History:  Diagnosis Date   Arthritis    Bell's palsy    CKD (chronic kidney disease)    Constipation    Erectile dysfunction    History of stomach ulcers    Hypertension    Insomnia    Knee pain    Murmur    Parkinson disease (HCC)    Parkinsons (HCC)    Tremor    Weakness generalized     His Past Surgical History Is Significant For: Past Surgical History:  Procedure Laterality Date   COLONOSCOPY  02/24/2017   ENDOSCOPY  02/24/2017   NO PAST SURGERIES     UPPER GI ENDOSCOPY     UPPER GI ENDOSCOPY  08/14/2019    His Family History Is Significant For: Family History  Problem Relation Age of Onset   Hypertension Mother 74   Liver disease Mother    Hypercholesterolemia Mother  Heart attack Mother    Stroke Father    Hypertension Father    Diabetes Father    Dementia Father    Diabetes Sister    Sleep apnea Neg Hx     His Social History Is Significant For: Social History   Socioeconomic History   Marital status: Married    Spouse name: Not on file   Number of children: 2   Years of education: College   Highest education level: Master's degree (e.g., MA, MS, MEng, MEd, MSW, MBA)  Occupational History   Occupation: Emergency planning/management officer  Tobacco Use   Smoking status: Never   Smokeless tobacco: Never  Vaping Use   Vaping Use: Never used  Substance and Sexual Activity   Alcohol use: Never   Drug use: Never   Sexual activity: Not on file  Other Topics Concern   Not on file  Social  History Narrative   ** Merged History Encounter **       Lives at home with wife and son. Right-hand. Occasional use of caffeine.   Social Determinants of Health   Financial Resource Strain: Not on file  Food Insecurity: Not on file  Transportation Needs: Not on file  Physical Activity: Not on file  Stress: Not on file  Social Connections: Not on file    His Allergies Are:  No Known Allergies:   His Current Medications Are:  Outpatient Encounter Medications as of 04/01/2022  Medication Sig   AMITIZA 24 MCG capsule Take 24 mcg by mouth daily as needed for constipation.   baclofen (LIORESAL) 10 MG tablet Take 5-10 mg by mouth 3 (three) times daily as needed for muscle spasms.   busPIRone (BUSPAR) 10 MG tablet Take 10 mg by mouth 2 (two) times daily.   carbidopa-levodopa-entacapone (STALEVO) 50-200-200 MG tablet TAKE 1 TABLET BY MOUTH 5 TIMES A DAY/EVERY 3 HOURS.   cetirizine (ZYRTEC) 10 MG tablet Take 10 mg by mouth daily.   Cholecalciferol (VITAMIN D3) 1.25 MG (50000 UT) CAPS Take 1 capsule by mouth once a week.   clonazePAM (KLONOPIN) 1 MG tablet Take 1 tablet (1 mg total) by mouth at bedtime. Please call [phone] to schedule yearly appt.   diclofenac (VOLTAREN) 75 MG EC tablet Take 75 mg by mouth 2 (two) times daily.   fluticasone (FLONASE) 50 MCG/ACT nasal spray Place 1 spray into both nostrils daily as needed for allergies or rhinitis.   hydrOXYzine (ATARAX) 50 MG tablet Take 50 mg by mouth at bedtime as needed.   losartan (COZAAR) 50 MG tablet Take 100 mg by mouth daily.   meloxicam (MOBIC) 15 MG tablet Take 15 mg by mouth daily.   naproxen (NAPROSYN) 500 MG tablet Take 1 tablet (500 mg total) by mouth 2 (two) times daily as needed.   pantoprazole (PROTONIX) 40 MG tablet Take 40 mg by mouth 2 (two) times daily.   Plecanatide (TRULANCE) 3 MG TABS Take 3 mg by mouth daily.   propranolol (INDERAL) 40 MG tablet TAKE 2 TABLETS TWICE A DAY (Patient taking differently: Take 40 mg by  mouth 2 (two) times daily.)   QUEtiapine (SEROQUEL) 25 MG tablet Take 12.5-25 mg by mouth at bedtime.   rotigotine (NEUPRO) 6 MG/24HR Place 1 patch onto the skin daily.   sildenafil (REVATIO) 20 MG tablet Take 1 tablet (20 mg total) by mouth 3 (three) times daily.   sodium chloride (OCEAN) 0.65 % nasal spray Place 1 spray into the nose 2 (two) times daily.   sucralfate (  CARAFATE) 1 GM/10ML suspension Take 1 g by mouth in the morning, at noon, in the evening, and at bedtime.   tadalafil (CIALIS) 20 MG tablet TAKE 1 TABLET DAILY AS NEEDED FOR ERECTILE DYSFUNCTION   tamsulosin (FLOMAX) 0.4 MG CAPS capsule Take 0.8 mg by mouth at bedtime.   traZODone (DESYREL) 100 MG tablet Take 100 mg by mouth at bedtime.   No facility-administered encounter medications on file as of 04/01/2022.  :   Review of Systems:  Out of a complete 14 point review of systems, all are reviewed and negative with the exception of these symptoms as listed below:  Review of Systems  Neurological:        Pt here for sleep consult  Pt has hypertension  Pt denies snoring,fatigue,headaches ,sleep study,CPAP machine     ESS:7 FSS :44    Objective:  Neurological Exam  Physical Exam Physical Examination:   Vitals:   04/01/22 1008  BP: 102/66  Pulse: 60    General Examination: The patient is a very pleasant 51 y.o. male in no acute distress. He appears well-developed and well-nourished and well groomed.   HEENT: Normocephalic, atraumatic, pupils are equal, round and reactive to light, tracking is impaired.  Face is slightly asymmetric, mild to moderate facial masking noted.  Speech is scant and significantly hypophonic, minimal dysarthria noted.  No carotid bruits.  Airway examination reveals a Mallampati class III, small airway, smaller tonsils, slightly wider uvula, neck circumference of 17-1/2 inches, no significant overbite.  Tongue protrudes centrally and palate elevates symmetrically.    Chest: Clear to  auscultation without wheezing, rhonchi or crackles noted.  Heart: S1+S2+0, regular and normal without murmurs, rubs or gallops noted.   Abdomen: Soft, non-tender and non-distended.  Extremities: There is no obvious edema.  Brace right leg.    Skin: Warm and dry without trophic changes noted.   Musculoskeletal: exam reveals no obvious joint deformities, tenderness or joint swelling or erythema.   Neurologically:  Mental status: The patient is awake, alert and oriented in all 4 spheres. Motor exam: Global strength at least 4 out of 5, able to move more easily on the left side.  No obvious tremor.   Cerebellar testing: No dysmetria or intention tremor.  There is no truncal or gait ataxia.  Impaired fine motor skills, more so on the right Sensory exam: intact to light touch in the upper and lower extremities.  Gait, station and balance: He stands with difficulty and pushes himself up.  He walks with a cane.   Assessment and Plan:  In summary, Ruben Silva is a very pleasant 51 y.o.-year old male with an underlying medical history of Parkinson's disease (followed by [name] neurology, previously seen by [facility] neurology, also seen by [name] at [facility] and also seen by Dr. Arbutus Leas at [facility]), Bell's palsy, chronic kidney disease, hypertension, knee pain, arthritis, and mild obesity, who presents for evaluation of his sleep disturbance.  He has difficulty maintaining sleep.  He is already on multiple sedating medications.  We talked about sleep testing to rule out an underlying organic cause of his sleep disturbance particularly sleep apnea.  He was shown a model for an AutoPap machine.  He would be willing to try AutoPap therapy if the need arises.  We talked about potential alternative treatment options including surgical and dental option for treatment of sleep apnea but he would be willing to consider a PAP machine which is generally considered first-line treatment.  He was  advised regarding the  risks and ramifications of untreated moderate to severe OSA, especially with respect to developing cardiovascular disease down the road, including congestive heart failure (CHF), difficult to treat hypertension, cardiac arrhythmias (particularly A-fib), neurovascular complications including TIA, stroke and dementia. Even type 2 diabetes has, in part, been linked to untreated OSA. Symptoms of untreated OSA may include (but may not be limited to) daytime sleepiness, nocturia (i.e. frequent nighttime urination), memory problems, mood irritability and suboptimally controlled or worsening mood disorder such as depression and/or anxiety, lack of energy, lack of motivation, physical discomfort, as well as recurrent headaches, especially morning or nocturnal headaches.  We will set up a sleep study pending insurance authorization.  We talked about the differences between a laboratory attended sleep study versus home sleep testing.  We will plan a follow-up in sleep clinic accordingly.  I answered all his questions today and the patient was in agreement.   I encouraged him to call with any interim questions, concerns, problems or updates or email [place] through MyChart.  Generally speaking, sleep test authorizations may take up to 2 weeks, sometimes less, sometimes longer, the patient is encouraged to get in touch with [place] if they do not hear back from the sleep lab staff directly within the next 2 weeks.  Thank you very much for allowing me to participate in the care of this nice patient. If I can be of any further assistance to you please do not hesitate to call me at [phone].  Sincerely,   Huston Foley, MD, PhD

## 2022-04-14 ENCOUNTER — Telehealth: Payer: Self-pay | Admitting: Neurology

## 2022-04-14 NOTE — Telephone Encounter (Signed)
[employer]- [employer] [place]: [id number] (exp. 04/13/22 to 06/11/22)  Patient is scheduled at [facility] for 05/07/22 at 9 pm.  Mailed packet to the patient.

## 2022-04-29 ENCOUNTER — Other Ambulatory Visit: Payer: Self-pay | Admitting: *Deleted

## 2022-04-29 DIAGNOSIS — R600 Localized edema: Secondary | ICD-10-CM

## 2022-05-07 ENCOUNTER — Ambulatory Visit (INDEPENDENT_AMBULATORY_CARE_PROVIDER_SITE_OTHER): Payer: Medicare Other | Admitting: Neurology

## 2022-05-07 DIAGNOSIS — G4700 Insomnia, unspecified: Secondary | ICD-10-CM | POA: Diagnosis not present

## 2022-05-07 DIAGNOSIS — R519 Headache, unspecified: Secondary | ICD-10-CM

## 2022-05-07 DIAGNOSIS — Z9189 Other specified personal risk factors, not elsewhere classified: Secondary | ICD-10-CM

## 2022-05-07 DIAGNOSIS — R9431 Abnormal electrocardiogram [ECG] [EKG]: Secondary | ICD-10-CM

## 2022-05-07 DIAGNOSIS — R351 Nocturia: Secondary | ICD-10-CM

## 2022-05-07 DIAGNOSIS — G4719 Other hypersomnia: Secondary | ICD-10-CM | POA: Diagnosis not present

## 2022-05-07 DIAGNOSIS — R0683 Snoring: Secondary | ICD-10-CM

## 2022-05-07 DIAGNOSIS — G4720 Circadian rhythm sleep disorder, unspecified type: Secondary | ICD-10-CM

## 2022-05-11 ENCOUNTER — Ambulatory Visit (INDEPENDENT_AMBULATORY_CARE_PROVIDER_SITE_OTHER): Payer: Medicare Other | Admitting: Physician Assistant

## 2022-05-11 ENCOUNTER — Ambulatory Visit (HOSPITAL_COMMUNITY)
Admission: RE | Admit: 2022-05-11 | Discharge: 2022-05-11 | Disposition: A | Discharge status: Home / Self Care | Payer: Medicare Other | Source: Ambulatory Visit | Attending: Surgery | Admitting: Surgery

## 2022-05-11 VITALS — BP 153/92 | HR 81 | Temp 97.3°F | Resp 16 | Ht 68.5 in | Wt 243.0 lb

## 2022-05-11 DIAGNOSIS — R600 Localized edema: Secondary | ICD-10-CM

## 2022-05-11 NOTE — Progress Notes (Signed)
Office Note     CC:  follow up Requesting Provider:  Tracey Harries, MD  HPI: Ruben Silva is a 51 y.o. (02-10-1971) male who presents for evaluation of bilateral lower leg swelling.  He was evaluated by his PCP 2 months ago and underwent right leg venous duplex which was negative for DVT.  He was referred to our office for further evaluation.  Since that time he had blood pressure medication changed and states the swelling has gotten worse.  He also states that an echocardiogram was done demonstrating a hypertrophic cardiomyopathy for which he is being seen by a cardiologist.  He denies any history of DVT, venous ulcerations, trauma, or prior vascular interventions.  He occasionally wears his compression socks and states it makes his legs feel better.  He does not have any rest pain or tissue loss of bilateral lower extremities.  He was diagnosed with Parkinson's about 9 years ago and struggles with stiffness and his mobility in general.   Past Medical History:  Diagnosis Date   Arthritis    Bell's palsy    CKD (chronic kidney disease)    Constipation    Erectile dysfunction    History of stomach ulcers    Hypertension    Insomnia    Knee pain    Murmur    Parkinson disease (HCC)    Parkinsons (HCC)    Tremor    Weakness generalized     Past Surgical History:  Procedure Laterality Date   COLONOSCOPY  02/24/2017   ENDOSCOPY  02/24/2017   NO PAST SURGERIES     UPPER GI ENDOSCOPY     UPPER GI ENDOSCOPY  08/14/2019    Social History   Socioeconomic History   Marital status: Married    Spouse name: Not on file   Number of children: 2   Years of education: Automotive engineer   Highest education level: Master's degree (e.g., MA, MS, MEng, MEd, MSW, MBA)  Occupational History   Occupation: Emergency planning/management officer  Tobacco Use   Smoking status: Never   Smokeless tobacco: Never  Vaping Use   Vaping Use: Never used  Substance and Sexual Activity   Alcohol use: Never   Drug use:  Never   Sexual activity: Not on file  Other Topics Concern   Not on file  Social History Narrative   ** Merged History Encounter **       Lives at home with wife and son. Right-hand. Occasional use of caffeine.   Social Determinants of Health   Financial Resource Strain: Not on file  Food Insecurity: Not on file  Transportation Needs: Not on file  Physical Activity: Not on file  Stress: Not on file  Social Connections: Not on file  Intimate Partner Violence: Not on file    Family History  Problem Relation Age of Onset   Hypertension Mother 61   Liver disease Mother    Hypercholesterolemia Mother    Heart attack Mother    Stroke Father    Hypertension Father    Diabetes Father    Dementia Father    Diabetes Sister    Sleep apnea Neg Hx     Current Outpatient Medications  Medication Sig Dispense Refill   AMITIZA 24 MCG capsule Take 24 mcg by mouth daily as needed for constipation.     baclofen (LIORESAL) 10 MG tablet Take 5-10 mg by mouth 3 (three) times daily as needed for muscle spasms.     busPIRone (BUSPAR) 10 MG tablet  Take 10 mg by mouth 2 (two) times daily.     carbidopa-levodopa-entacapone (STALEVO) 50-200-200 MG tablet TAKE 1 TABLET BY MOUTH 5 TIMES A DAY/EVERY 3 HOURS. 150 tablet 11   cetirizine (ZYRTEC) 10 MG tablet Take 10 mg by mouth as needed for allergies.     Cholecalciferol (VITAMIN D3) 1.25 MG (50000 UT) CAPS Take 1 capsule by mouth once a week.     clonazePAM (KLONOPIN) 1 MG tablet Take 1 tablet (1 mg total) by mouth at bedtime. Please call [phone] to schedule yearly appt. 30 tablet 5   diclofenac (VOLTAREN) 75 MG EC tablet Take 75 mg by mouth 2 (two) times daily.     fluticasone (FLONASE) 50 MCG/ACT nasal spray Place 1 spray into both nostrils daily as needed for allergies or rhinitis.     hydrOXYzine (ATARAX) 50 MG tablet Take 50 mg by mouth at bedtime as needed.     losartan (COZAAR) 50 MG tablet Take 100 mg by mouth daily.     meloxicam (MOBIC)  15 MG tablet Take 15 mg by mouth daily.     naproxen (NAPROSYN) 500 MG tablet Take 1 tablet (500 mg total) by mouth 2 (two) times daily as needed. 30 tablet 0   pantoprazole (PROTONIX) 40 MG tablet Take 40 mg by mouth 2 (two) times daily.     Plecanatide (TRULANCE) 3 MG TABS Take 3 mg by mouth daily.     Plecanatide (TRULANCE) 3 MG TABS Take by mouth.     QUEtiapine (SEROQUEL) 25 MG tablet Take 12.5-25 mg by mouth at bedtime.     rotigotine (NEUPRO) 6 MG/24HR Place 1 patch onto the skin daily. 30 patch 5   sildenafil (REVATIO) 20 MG tablet Take 1 tablet (20 mg total) by mouth 3 (three) times daily. 10 tablet 0   sodium chloride (OCEAN) 0.65 % nasal spray Place 1 spray into the nose 2 (two) times daily.     sucralfate (CARAFATE) 1 GM/10ML suspension Take 1 g by mouth in the morning, at noon, in the evening, and at bedtime.     tadalafil (CIALIS) 20 MG tablet TAKE 1 TABLET DAILY AS NEEDED FOR ERECTILE DYSFUNCTION     tamsulosin (FLOMAX) 0.4 MG CAPS capsule Take 0.8 mg by mouth at bedtime.     traZODone (DESYREL) 100 MG tablet Take 100 mg by mouth at bedtime.     propranolol (INDERAL) 40 MG tablet TAKE 2 TABLETS TWICE A DAY (Patient not taking: Reported on 05/11/2022) 120 tablet 5   No current facility-administered medications for this visit.    No Known Allergies   REVIEW OF SYSTEMS:   [X]  denotes positive finding, [ ]  denotes negative finding Cardiac  Comments:  Chest pain or chest pressure:    Shortness of breath upon exertion:    Short of breath when lying flat:    Irregular heart rhythm:        Vascular    Pain in calf, thigh, or hip brought on by ambulation:    Pain in feet at night that wakes you up from your sleep:     Blood clot in your veins:    Leg swelling:         Pulmonary    Oxygen at home:    Productive cough:     Wheezing:         Neurologic    Sudden weakness in arms or legs:     Sudden numbness in arms or legs:  Sudden onset of difficulty speaking or  slurred speech:    Temporary loss of vision in one eye:     Problems with dizziness:         Gastrointestinal    Blood in stool:     Vomited blood:         Genitourinary    Burning when urinating:     Blood in urine:        Psychiatric    Major depression:         Hematologic    Bleeding problems:    Problems with blood clotting too easily:        Skin    Rashes or ulcers:        Constitutional    Fever or chills:      PHYSICAL EXAMINATION:  Vitals:   05/11/22 1332  BP: (!) 153/92  Pulse: 81  Resp: 16  Temp: (!) 97.3 F (36.3 C)  TempSrc: Temporal  SpO2: 100%  Weight: 243 lb (110.2 kg)  Height: 5' 8.5" (1.74 m)    General:  WDWN in NAD; vital signs documented above Gait: Not observed HENT: WNL, normocephalic Pulmonary: normal non-labored breathing , without Rales, rhonchi,  wheezing Cardiac: regular HR Abdomen: soft, NT, no masses Skin: without rashes Vascular Exam/Pulses:  Right Left  Radial 2+ (normal) 2+ (normal)  DP 2+ (normal) 2+ (normal)   Extremities: without ischemic changes, without Gangrene , without cellulitis; without open wounds;  Musculoskeletal: no muscle wasting or atrophy  Neurologic: A&O X 3;  No focal weakness or paresthesias are detected Psychiatric:  The pt has Normal affect.   Non-Invasive Vascular Imaging:   Right lower extremity venous reflux study negative for DVT, deep venous reflux, superficial venous reflux    ASSESSMENT/PLAN:: 51 y.o. male here for evaluation of bilateral lower extremity edema.  Patient has been worked up for hypertrophic cardiomyopathy since the time of referral.  He has also had adjustments to his blood pressure medication which has made edema of his legs worse.  Right lower extremity venous reflux study was negative for DVT, deep venous reflux, and superficial venous reflux.  He also has well-perfused bilateral lower extremities with palpable DP pulses.  My recommendations included to contact his  cardiologist office to notify them of his side effect of worsening bilateral lower extremity edema since blood pressure medication change.  He will also continue his cardiac work-up and has a nuclear medicine stress test in the near future.  Nothing to offer from a vascular surgery standpoint.  He will follow-up on an as-needed basis.  Emilie Rutter, PA-C [facility] [phone]  Clinic MD:   Myra Gianotti

## 2022-05-13 NOTE — Procedures (Signed)
[facility] POLYSOMNOGRAPHY  INTERPRETATION REPORT   STUDY DATE:  05/07/2022     PATIENT NAME:  Ruben Silva         DATE OF BIRTH:  06-Feb-1971  PATIENT ID:  [id number]    TYPE OF STUDY:  PSG  READING PHYSICIAN: Huston Foley, MD, PhD REFERRED BY: Ralene Ok, PA SCORING TECHNICIAN: Domingo Cocking, RPSGT   HISTORY: 51 year old right-handed gentleman with an underlying medical history of Parkinson's disease, Bell's palsy, chronic kidney disease, hypertension, knee pain, arthritis, and mild obesity, who reports excessive daytime somnolence and difficulty initiating as well as maintaining sleep. Height: 68 in Weight: 245 lb (BMI 37) Neck Size: 18 in  MEDICATIONS: Amitiza, Lioresal, Buspar, Stalevo, Zyrtec, Vitamin D3, Klonopin, Voltaren, Flonase, Atarax, Cozaar, Mobic, Naprosyn, Protonix, Trulance, Inderal, Seroquel, Neupro, Revatio, Ocean, Carafate, Cialis, Flomax, Desyrel TECHNICAL DESCRIPTION: A registered sleep technologist was in attendance for the duration of the recording.  Data collection, scoring, video monitoring, and reporting were performed in compliance with the AASM Manual for the Scoring of Sleep and Associated Events; (Hypopnea is scored based on the criteria listed in Section VIII D. 1b in the AASM Manual V2.6 using a 4% oxygen desaturation rule or Hypopnea is scored based on the criteria listed in Section VIII D. 1a in the AASM Manual V2.6 using 3% oxygen desaturation and /or arousal rule).   SLEEP CONTINUITY AND SLEEP ARCHITECTURE:  Lights-out was at 22:08: and lights-on at  05:08.  Total recording time (TRT) was 6 hours, 59.5 minutes. Total sleep time ( TST) was 261.0 minutes with a decreased sleep efficiency at 62.2%.  BODY POSITION:  TST was divided  between the following sleep positions: 0.0% supine;  85.8% lateral;  14% prone. Duration of total sleep and percent of total sleep in their respective position is as follows: supine 00 minutes  (0%), non-supine 261 minutes (100%); right 00 minutes (0%), left 224 minutes (86%), and prone 37 minutes (14%).  Total supine REM sleep time was 00 minutes (0% of total REM sleep).  Sleep latency was increased at 32.5 minutes.  REM sleep latency was normal at 86.5 minutes. Of the total sleep time, the percentage of stage N1 sleep was 14.6%, which is increased, stage N2 sleep was 66%, which is increased, stage N3 sleep was absent, and REM sleep was 19.9%, which is normal.  There were 3 Stage R periods observed on this study night, 23 awakenings (i.e. transitions to Stage W from any sleep stage), and 70 total stage transitions. Wake after sleep onset (WASO) time accounted for 125.5 minutes with mild to moderate sleep fragmentation noted.   RESPIRATORY MONITORING:   Based on CMS criteria (using a 4% oxygen desaturation rule for scoring hypopneas), there were 5 apneas (3 obstructive; 0 central; 2 mixed), and 2 hypopneas.  Apnea index was 1.1. Hypopnea index was 0.5. The apnea-hypopnea index was 1.6 overall (0.0 supine, 0 non-supine; 0.0 REM, 0.0 supine REM).  There were 0 respiratory effort-related arousals (RERAs).  The RERA index was 0 events/h. Total respiratory disturbance index (RDI) was 1.6 events/h. RDI results showed: supine RDI  0.0 /h; non-supine RDI 1.6 /h; REM RDI 0.0 /h, supine REM RDI 0.0 /h.   Based on AASM criteria (using a 3% oxygen desaturation and /or arousal rule for scoring hypopneas), there were 5 apneas (3 obstructive; 0 central; 2 mixed), and 2 hypopneas. Apnea index was 1.1. Hypopnea index was 0.5. The apnea-hypopnea index was 1.6 overall (0.0 supine, 0 non-supine; 0.0  REM, 0.0 supine REM).  There were 0 respiratory effort-related arousals (RERAs).  The RERA index was 0 events/h. Total respiratory disturbance index (RDI) was 1.6 events/h. RDI results showed: supine RDI  0.0 /h; non-supine RDI 1.6 /h; REM RDI 0.0 /h, supine REM RDI 0.0 /h.  OXIMETRY: Oxyhemoglobin Saturation Nadir  during sleep was at 91%) from a mean of 95%.  Of the Total sleep time (TST)   hypoxemia (=<88%) was present for  0.0 minutes, or 0.0% of total sleep time.  LIMB MOVEMENTS: There were 0 periodic limb movements of sleep (0.0/hr), of which 0 (0.0/hr) were associated with an arousal. AROUSAL: There were 21 arousals in total, for an arousal index of 5 arousals/hour.  Of these, 5 were identified as respiratory-related arousals (1 /h), 0 were PLM-related arousals (0 /h), and 28 were non-specific arousals (6 /h). There were 0 occurrences of Cheyne Stokes breathing.  Snoring was classified as mild and intermittent.  EEG:  The EEG was of normal amplitude and frequency, with symmetric manifestation of sleep stages. EKG: The electrocardiogram showed normal sinus rhythm with occastional PVCs.  The average heart rate during sleep was 69 bpm.  The heart rate during sleep varied between a minimum of Tachycardia and  a maximum of  92 bpm. AUDIO and VIDEO: The video and audio analysis did not show any abnormal or unusual behaviors, movements, phonations or vocalizations. The patient took 2 bathroom breaks.  Post study, the patient indicated, that sleep was the same as usual. IMPRESSION: 1. Primary snoring 2. Dysfunctions associated with sleep stages or arousal from sleep 3. Non-specific abnormal electrocardiogram (EKG) RECOMMENDATIONS: 1. This study does not demonstrate any significant obstructive or central sleep disordered breathing with an AHI of less than 5/hour. His total AHI was 1.6/hour, O2 nadir 91%. The absence of supine sleep may underestimate his sleep disordered breathing to some degree. Mild intermittent snoring was noted. Treatment with a positive airway pressure device, such as CPAP or autoPAP is not indicated. Weight loss may aid in reducing his snoring.  2. This study shows sleep fragmentation and abnormal sleep stage percentages; these are nonspecific findings and per se do not signify an intrinsic  sleep disorder or a cause for the patient's sleep-related symptoms. Causes include (but are not limited to) the first night effect of the sleep study, circadian rhythm disturbances, medication effect or an underlying mood disorder or medical problem.  3. The patient should be cautioned not to drive, work at heights, or operate dangerous or heavy equipment when tired or sleepy. Review and reiteration of good sleep hygiene measures should be pursued with any patient. 4. The study showed rare PVCs on single lead EKG; clinical correlation is recommended and consultation with cardiology may be feasible.  5. The patient will be advised to follow up with the referring provider, who will be notified of the test results.   I certify that I have reviewed the entire raw data recording prior to the issuance of this report in accordance with the Standards of Accreditation of the American Academy of Sleep Medicine (AASM).  Huston Foley,  MD, PhD

## 2022-05-14 ENCOUNTER — Telehealth: Payer: Self-pay | Admitting: *Deleted

## 2022-05-14 NOTE — Telephone Encounter (Signed)
-----   Message from Huston Foley, MD sent at 05/13/2022  5:42 PM EDT ----- Patient referred by PCP, seen by me on 04/01/22, diagnostic PSG on 05/07/22.   Please call and notify the patient that the recent sleep study did not show any significant obstructive sleep apnea with an AHI of less than 5/hour. His total AHI was 1.6/hour, O2 nadir 91%. The absence of supine sleep may underestimate his sleep disordered breathing to some degree. Mild intermittent snoring was noted. Treatment with a positive airway pressure device, such as CPAP or autoPAP is not indicated. Weight loss may aid in reducing his snoring. He had very occasional PVCs on EKG, which are usually benign extra beats. He is followed by cardiology. At this juncture, he can FU with his PCP.   Thanks,  Huston Foley, MD, PhD [facility])

## 2022-05-14 NOTE — Telephone Encounter (Signed)
Spoke with patient gave sleep study results . Pt wants a copy of sleep study results mailed to his home and sent to his PCP . Gave Dr Frances Furbish recommendation  Pt states understanding and thanked me for calling .

## 2022-12-23 NOTE — ED Provider Notes (Signed)
 " [facility]  ED Provider Note  Ruben Silva 52 y.o. male DOB: Nov 29, 1970 MRN: [id number] History   Chief Complaint  Patient presents with   Hypertension    Arrived by ems [place] [facility]. Called out for chest pressure and abd pain as a result of HTN. Did not take his losartan today, SBP >200 pta.    Patient arrived via EMS from [facility] [place].  He had called because he is having chest pressure and abdominal pain and headache.  He did not take his losartan today and his blood pressure was above 200 systolic.  He says that he has been compliant with his medication otherwise and he said the headache started earlier today was very mild in nature and slowly worsened over time.  He does not have frequent headaches or migraines.  He denies any head trauma or bleeding.  Patient does have a history of Parkinson's disease he has a Sinemet  pump and per patient is on a Duopa  pump. Denies chest pain currently, SOB. Not on anticoagulants.      Past Medical History:  Diagnosis Date   Arthritis    Bell's palsy    Chronic kidney disease    GERD (gastroesophageal reflux disease)    Heart murmur    Hypertension    Insomnia    Parkinsons (*)    Sleep apnea    Pt said neg study   Tremor     Past Surgical History:  Procedure Laterality Date   Colonoscopy  02/24/2017   Endoscopy  02/24/2017   Upper gastrointestinal endoscopy  2018   [facility]   Upper gastrointestinal endoscopy  08/14/2019    Social History   Substance and Sexual Activity  Alcohol Use Never   Social History   Tobacco Use  Smoking Status Never   Passive exposure: Never  Smokeless Tobacco Never   E-Cigarettes   Vaping Use Never User    Start Date     Cartridges/Day     Quit Date     Social History   Substance and Sexual Activity  Drug Use Never         No Known Allergies  Home Medications   ALBUTEROL SULFATE HFA (PROVENTIL HFA) 108 (90 BASE)  MCG/ACT INHALER    Inhale two puffs into the lungs every 4 (four) hours as needed for Wheezing or Shortness of Breath.   AMLODIPINE BESYLATE (NORVASC) 5 MG TABLET    Take one tablet (5 mg dose) by mouth daily.   CARBIDOPA -LEVODOPA  (SINEMET ) 25-100 MG PER TABLET    Take by mouth.   CETIRIZINE (ZYRTEC) 10 MG TABLET    Take one tablet (10 mg dose) by mouth daily.   CLONAZEPAM  (KLONOPIN ) 0.5 MG TABLET    Take three tablets (1.5 mg dose) by mouth at bedtime. Max Daily Amount: 2 mg   CLONAZEPAM  (KLONOPIN ) 1 MG TABLET    Take one tablet (1 mg dose) by mouth daily.   KETOCONAZOLE (NIZORAL) 2% SHAMPOO    Apply topically.   LINACLOTIDE  (LINZESS ) 290 MCG CAPSULE    Take one capsule (290 mcg dose) by mouth daily.   LOSARTAN POTASSIUM (COZAAR) 100 MG TABLET    Take one tablet (100 mg dose) by mouth daily.   MELOXICAM  (MOBIC ) 15 MG TABLET    Take one tablet (15 mg dose) by mouth daily.   PANTOPRAZOLE SODIUM (PROTONIX) 40 MG TABLET    Take one tablet (40 mg dose) by mouth 2 (two) times daily.  PROPRANOLOL  HCL (INDERAL ) 40 MG TABLET    Take one tablet (40 mg dose) by mouth 2 (two) times daily.   SEMAGLUTIDE-WEIGHT MANAGEMENT (WEGOVY) 0.25 MG/0.5ML SOAJ    Inject 0.5 mLs (0.25 mg dose) into the skin once a week.   TADALAFIL (CIALIS) 20 MG TABLET    Take one tablet (20 mg dose) by mouth daily as needed for Erectile Dysfunction.   TAMSULOSIN (FLOMAX) 0.4 MG CAPS    Take two capsules (0.8 mg dose) by mouth at bedtime. Takes two capsules at bedtime.   TRIAMCINOLONE ACETONIDE (KENALOG) 0.1% CREAM    SMARTSIG:1 Application Topical 2-3 Times Daily   VITAMIN D3, CHOLECALCIFEROL, (OPTIMAL-D) 50,000 UNITS CAPS    Take one capsule by mouth once a week.    Review of Systems   Review of Systems  Constitutional:  Negative for chills and fever.  HENT:  Negative for ear pain and sore throat.   Eyes:  Negative for pain and visual disturbance.  Respiratory:  Negative for cough and shortness of breath.   Cardiovascular:   Positive for chest pain. Negative for palpitations.  Gastrointestinal:  Negative for abdominal pain and vomiting.  Genitourinary:  Negative for dysuria and hematuria.  Musculoskeletal:  Negative for arthralgias and back pain.  Skin:  Negative for color change and rash.  Neurological:  Positive for headaches. Negative for seizures and syncope.    Physical Exam   ED Triage Vitals [12/23/22 1850]  BP (!) 254/137  Heart Rate 71  Resp 16  SpO2 97 %  Temp 98.7 F (37.1 C)    Physical Exam  Nursing note and vitals reviewed. Constitutional: He appears well-developed. He does not appear distressed.  HENT:  Head: Normocephalic and atraumatic.  Eyes: Pupils are equal, round, and reactive to light.  Neck: Normal range of motion. Neck supple. No nuchal rigidity.  Cardiovascular: Normal rate, regular rhythm and intact distal pulses.  Pulmonary/Chest: Respiratory effort normal and breath sounds normal.  Abdominal: Soft. There is no abdominal tenderness. There is no guarding. Abdomen not distended.  Musculoskeletal: Normal range of motion. No obvious deformity noted to extremities.     Cervical back: Normal range of motion and neck supple. no edema.  Neurological: He is alert and oriented to person, place, and time. Cranial nerves intact II through XII.  Skin: Skin is not cool.    ED Course   Lab results:   CBC AND DIFFERENTIAL - Abnormal      Result Value   WBC 6.1     RBC 5.69 (*)    HGB 15.3     HCT 46.3     MCV 81 (*)    MCH 26.9 (*)    MCHC 33.0     Plt Ct 266     RDW SD 41.1     MPV 10.0     NRBC% 0.0     NRBC 0.000     NEUTROPHIL % 65.2     LYMPHOCYTE % 23.2 (*)    MONOCYTE % 9.3     Eosinophil % 1.5     BASOPHIL % 0.5     IG% 0.300     ABSOLUTE NEUTROPHIL COUNT 3.98     ABSOLUTE LYMPHOCYTE COUNT 1.4     Mono Absolute 0.6     EOS ABSOLUTE 0.1     BASO ABSOLUTE 0.0     IG ABSOLUTE 0.020    COMPREHENSIVE METABOLIC PANEL - Abnormal   Na 137     Potassium 4.7  Cl 100     CO2 27     AGAP 10     Glucose 161 (*)    BUN 15     Creatinine 1.07     Ca 9.5     ALK PHOS 64     T Bili 0.23     Total Protein 7.9     Alb 4.2     GLOBULIN 3.7     ALBUMIN/GLOBULIN RATIO 1.1     BUN/CREAT RATIO 14.0     ALT 6     AST 16     eGFR 85     Comment: Normal GFR (glomerular filtration rate) > 60 mL/min/1.73 meters squared, < 60 may include impaired kidney function. Calculation based on the Chronic Kidney Disease Epidemiology Collaboration (CK-EPI)equation refit without adjustment for race.  GEN5 CARDIAC TROPONIN T (TNT5) BASELINE - Normal   TnT-Gen5 (0hr) 12     Comment: An elevated Troponin indicates myocardial damage. Elevated troponin may also be due to pulmonary emboli, aortic dissection, heart failure, trauma, toxins and ischemia in the setting of critical illness.   MAGNESIUM  - Normal   Mg 1.8    GEN5 CARDIAC TROPONIN T(TNT5) 1 HOUR - Normal   TnT-Gen5 (1hr) 12     Comment: An elevated Troponin indicates myocardial damage. Elevated troponin may also be due to pulmonary emboli, aortic dissection, heart failure, trauma, toxins and ischemia in the setting of critical illness.   Delta 1 Hour 0    GEN5 CARDIAC TROPONIN T(TNT5) 3 HOUR  LIGHT BLUE TOP  GOLD SST    Imaging:   XR CHEST AP PORTABLE   Narrative:    EXAM: XR CHEST AP PORTABLE DATE: 12/23/2022 6:56 PM ACCESSION: [id number] DICTATED: 12/23/2022 7:47 PM  CLINICAL INDICATION: 52 years old Male with Chest Pain    COMPARISON: 11/20/2022  TECHNIQUE: Portable Chest Radiograph.  FINDINGS:   Hazy opacification of the left lung base. Low volumes.  No pleural effusion or pneumothorax  Prominent cardiac silhouette.      Impression:    IMPRESSION:  Hazy opacification the left lung base may be due to low lung volumes and overlapping shadows versus developing infiltrate.  Prominent cardiac silhouette accentuated by low volumes and technique.  Electronically Signed by: Cheryle Cork,  MD on 12/23/2022 7:49 PM  CT HEAD WO CONTRAST   Narrative:    INDICATION:Headache  TECHNIQUE:  CT HEAD WO CONTRAST  FINDINGS:   CT HEAD No acute intracranial hemorrhage identified. No acute midline shift or acute mass effect.  No abnormal extra-axial fluid collections are seen.   Ventricles, cisterns, and sulci are unremarkable. Visualized orbits are unremarkable.   Visualized paranasal sinuses are clear.   Mastoid air cells are clear. No displaced or depressed calvarial fracture identified.     Impression:    IMPRESSION:  CT HEAD No acute intracranial abnormality identified.  Electronically Signed by: Charlie Patch, MD on 12/23/2022 8:26 PM    ECG: ECG Results          ECG 12 lead (In process)  Result time 12/23/22 20:44:53    In process             Narrative:   Diagnosis Class Borderline Abnormal Acquisition Device D3K Systolic BP 176 Diastolic BP 89 Ventricular Rate 78 Atrial Rate 78 P-R Interval 166 QRS Duration 82 Q-T Interval 370 QTC Calculation(Bazett) 421 Calculated P Axis 51 Calculated R Axis 40 Calculated T Axis 15  Diagnosis Normal sinus rhythm with sinus arrhythmia Normal ECG  When compared with ECG of 23-Dec-2022 19:06, No significant change was found                         ECG 12 lead (In process)  Result time 12/23/22 19:10:48    In process             Narrative:   Diagnosis Class Borderline Abnormal Acquisition Device D3K Systolic BP 140 Diastolic BP 84 Ventricular Rate 68 Atrial Rate 68 P-R Interval 166 QRS Duration 90 Q-T Interval 382 QTC Calculation(Bazett) 406 Calculated P Axis 41 Calculated R Axis 53 Calculated T Axis 25  Diagnosis Normal sinus rhythm with sinus arrhythmia Normal ECG When compared with ECG of 18-Apr-2022 19:24, No significant change was found                      HEAR Score History: Mostly low risk features ECG: Normal Age: Less than 45 yrs Risk Factors: No risk  factors known HEAR Score Total: 0                        Pre-Sedation Procedures    Medical Decision Making Headache - Headache could be a cause of his hypertension or could be a result from hypertensive emergency.  Plan to give headache medications additional IV fluids and labetalol to lower his blood pressure more acutely.  CT head to rule out cerebral edema, troponin or stratify for ACS along with EKG and we will also get CMP to assess for any renal pathology.  Amount and/or Complexity of Data Reviewed Labs: ordered. Radiology: ordered. ECG/medicine tests: ordered and independent interpretation performed.    Details: EKG is 68 bpm, normal sinus rhythm, no ST segment deviation, T wave inversion versus flattening is noticed in lead III, normal intervals otherwise.  Risk Prescription drug management.       Provider Communication  New Prescriptions   No medications on file    Modified Medications   No medications on file    Discontinued Medications   No medications on file    Clinical Impression   Final diagnoses:  Hypertension, unspecified type    ED Disposition     ED Disposition  Discharge   Condition  Stable   Comment  --                 Follow-up Information     Lauraine KATHEE Burkes, PA-C. Schedule an appointment as soon as possible for a visit in 1 week.   Specialty: Family Medicine Contact information: [address] [place] [id number] [phone]                  Electronically signed by:    Noah F Delone, MD 12/23/22 2111  "

## 2024-09-24 ENCOUNTER — Emergency Department (HOSPITAL_COMMUNITY)

## 2024-09-24 ENCOUNTER — Emergency Department (HOSPITAL_COMMUNITY)
Admission: EM | Admit: 2024-09-24 | Discharge: 2024-09-24 | Disposition: A | Discharge status: Home / Self Care | Attending: Emergency Medicine | Admitting: Emergency Medicine

## 2024-09-24 ENCOUNTER — Encounter (HOSPITAL_COMMUNITY): Payer: Self-pay

## 2024-09-24 ENCOUNTER — Other Ambulatory Visit: Payer: Self-pay

## 2024-09-24 DIAGNOSIS — R109 Unspecified abdominal pain: Secondary | ICD-10-CM | POA: Diagnosis present

## 2024-09-24 DIAGNOSIS — G20A1 Parkinson's disease without dyskinesia, without mention of fluctuations: Secondary | ICD-10-CM | POA: Diagnosis not present

## 2024-09-24 DIAGNOSIS — Z931 Gastrostomy status: Secondary | ICD-10-CM | POA: Diagnosis not present

## 2024-09-24 DIAGNOSIS — K5900 Constipation, unspecified: Secondary | ICD-10-CM | POA: Insufficient documentation

## 2024-09-24 DIAGNOSIS — J9811 Atelectasis: Secondary | ICD-10-CM | POA: Insufficient documentation

## 2024-09-24 DIAGNOSIS — R519 Headache, unspecified: Secondary | ICD-10-CM | POA: Diagnosis present

## 2024-09-24 DIAGNOSIS — N400 Enlarged prostate without lower urinary tract symptoms: Secondary | ICD-10-CM | POA: Insufficient documentation

## 2024-09-24 DIAGNOSIS — I517 Cardiomegaly: Secondary | ICD-10-CM | POA: Diagnosis not present

## 2024-09-24 LAB — CBC WITH DIFFERENTIAL/PLATELET
Abs Immature Granulocytes: 0.01 10*3/uL (ref 0.00–0.07)
Basophils Absolute: 0 10*3/uL (ref 0.0–0.1)
Basophils Relative: 1 %
Eosinophils Absolute: 0.1 10*3/uL (ref 0.0–0.5)
Eosinophils Relative: 1 %
HCT: 44.9 % (ref 39.0–52.0)
Hemoglobin: 14.8 g/dL (ref 13.0–17.0)
Immature Granulocytes: 0 %
Lymphocytes Relative: 24 %
Lymphs Abs: 1.5 10*3/uL (ref 0.7–4.0)
MCH: 27.5 pg (ref 26.0–34.0)
MCHC: 33 g/dL (ref 30.0–36.0)
MCV: 83.3 fL (ref 80.0–100.0)
Monocytes Absolute: 0.6 10*3/uL (ref 0.1–1.0)
Monocytes Relative: 9 %
Neutro Abs: 4.3 10*3/uL (ref 1.7–7.7)
Neutrophils Relative %: 65 %
Platelets: 259 10*3/uL (ref 150–400)
RBC: 5.39 MIL/uL (ref 4.22–5.81)
RDW: 14.4 % (ref 11.5–15.5)
WBC: 6.5 10*3/uL (ref 4.0–10.5)
nRBC: 0 % (ref 0.0–0.2)

## 2024-09-24 LAB — URINALYSIS, ROUTINE W REFLEX MICROSCOPIC
Bilirubin Urine: NEGATIVE
Glucose, UA: NEGATIVE mg/dL
Hgb urine dipstick: NEGATIVE
Ketones, ur: 5 mg/dL — AB
Leukocytes,Ua: NEGATIVE
Nitrite: NEGATIVE
Protein, ur: NEGATIVE mg/dL
Specific Gravity, Urine: 1.015 (ref 1.005–1.030)
pH: 6 (ref 5.0–8.0)

## 2024-09-24 LAB — COMPREHENSIVE METABOLIC PANEL WITH GFR
ALT: 5 U/L (ref 0–44)
AST: 20 U/L (ref 15–41)
Albumin: 4.3 g/dL (ref 3.5–5.0)
Alkaline Phosphatase: 56 U/L (ref 38–126)
Anion gap: 10 (ref 5–15)
BUN: 12 mg/dL (ref 6–20)
CO2: 26 mmol/L (ref 22–32)
Calcium: 10 mg/dL (ref 8.9–10.3)
Chloride: 99 mmol/L (ref 98–111)
Creatinine, Ser: 1.04 mg/dL (ref 0.61–1.24)
GFR, Estimated: >60 mL/min
Glucose, Bld: 134 mg/dL — ABNORMAL HIGH (ref 70–99)
Potassium: 4.1 mmol/L (ref 3.5–5.1)
Sodium: 136 mmol/L (ref 135–145)
Total Bilirubin: 0.5 mg/dL (ref 0.0–1.2)
Total Protein: 8 g/dL (ref 6.5–8.1)

## 2024-09-24 LAB — LIPASE, BLOOD: Lipase: 26 U/L (ref 11–51)

## 2024-09-24 MED ORDER — MAGNESIUM CITRATE PO SOLN: 1.0000 | Freq: Once | ORAL | 0 refills | Status: AC

## 2024-09-24 MED ORDER — POLYETHYLENE GLYCOL 3350 17 G PO PACK
17.0000 g | PACK | Freq: Every day | ORAL | Status: DC
  Filled 2024-09-24: qty 1

## 2024-09-24 MED ORDER — POLYETHYLENE GLYCOL 3350 17 G PO PACK: 17.0000 g | PACK | Freq: Every day | ORAL | 0 refills | Status: AC

## 2024-09-24 MED ORDER — ACETAMINOPHEN 325 MG PO TABS
650.0000 mg | ORAL_TABLET | Freq: Once | ORAL | Status: AC
  Administered 2024-09-24: 650 mg via ORAL
  Filled 2024-09-24: qty 2

## 2024-09-24 MED ORDER — IOHEXOL 300 MG/ML  SOLN
100.0000 mL | Freq: Once | INTRAMUSCULAR | Status: AC | PRN
  Administered 2024-09-24: 100 mL via INTRAVENOUS

## 2024-09-24 NOTE — ED Triage Notes (Signed)
 Pt bib EMS from [facility]. Constipated x2 weeks and feels dehydrated.

## 2024-09-24 NOTE — ED Notes (Signed)
 Pt provided discharge instructions and prescription information. Pt was given the opportunity to ask questions and questions were answered.

## 2024-09-24 NOTE — ED Notes (Signed)
 Pt has miralax  at home and does not want to take it at this time.

## 2024-09-24 NOTE — Discharge Instructions (Signed)
 You are seen in the ER today for constipation.  I recommend that you begin taking MiraLAX  and magnesium  citrate to help with.  I also recommend that you follow-up with your primary care doctor for this to see if there is any other management they would like to do.  Also recommend taking Tylenol  as needed for your headaches.  I also recommend following up with your primary care for these headaches and for your sleeping difficulties.  If you start to develop any worse aches, blurred vision, dizziness, difficulty walking, increase in abdominal pain, vomiting, diarrhea please return to the ER.

## 2024-09-24 NOTE — ED Provider Notes (Signed)
 " [place] [facility]MENT AT [facility] Provider Note   CSN: [id number] Arrival date & time: 09/24/24  1020     Patient presents with: Constipation   Ruben Silva is a 54 y.o. male.    Constipation 54 year old male presenting with abdominal pain and constipation.  Patient reports whenever he arrived to the ER he did in fact take a bowel movement.  However still having the abdominal pain and bloating.  Patient reports he also has a headache but believes that this is from his pillow.  Patient denies any chest pain or shortness of breath.  Patient also feels as though he may have a UTI because he is having some pain with urination.     Prior to Admission medications  Medication Sig Start Date End Date Taking? Authorizing Provider  magnesium  citrate SOLN Take 296 mLs (1 Bottle total) by mouth once for 1 dose. 09/24/24 09/24/24 Yes Rosaline Almarie MATSU, PA-C  polyethylene glycol (MIRALAX ) 17 g packet Take 17 g by mouth daily. 09/24/24  Yes Rosaline Almarie MATSU, PA-C  AMITIZA 24 MCG capsule Take 24 mcg by mouth daily as needed for constipation. 10/14/18   [provider]  baclofen (LIORESAL) 10 MG tablet Take 5-10 mg by mouth 3 (three) times daily as needed for muscle spasms.    [provider]  busPIRone (BUSPAR) 10 MG tablet Take 10 mg by mouth 2 (two) times daily.    [provider]  carbidopa -levodopa -entacapone  (STALEVO ) 50-200-200 MG tablet TAKE 1 TABLET BY MOUTH 5 TIMES A DAY/EVERY 3 HOURS. 02/20/19   Onita Duos, MD  cetirizine (ZYRTEC) 10 MG tablet Take 10 mg by mouth as needed for allergies.    [provider]  Cholecalciferol (VITAMIN D3) 1.25 MG (50000 UT) CAPS Take 1 capsule by mouth once a week.    [provider]  clonazePAM  (KLONOPIN ) 1 MG tablet Take 1 tablet (1 mg total) by mouth at bedtime. Please call [phone] to schedule yearly appt. 05/22/19   Onita Duos, MD  diclofenac  (VOLTAREN ) 75 MG EC tablet Take 75 mg  by mouth 2 (two) times daily.    [provider]  fluticasone  (FLONASE ) 50 MCG/ACT nasal spray Place 1 spray into both nostrils daily as needed for allergies or rhinitis.    [provider]  hydrOXYzine (ATARAX) 50 MG tablet Take 50 mg by mouth at bedtime as needed.    [provider]  losartan (COZAAR) 50 MG tablet Take 100 mg by mouth daily. 02/27/19   [provider]  meloxicam  (MOBIC ) 15 MG tablet Take 15 mg by mouth daily.    [provider]  naproxen  (NAPROSYN ) 500 MG tablet Take 1 tablet (500 mg total) by mouth 2 (two) times daily as needed. 03/29/21   Stuart Vernell Almarie, PA-C  pantoprazole (PROTONIX) 40 MG tablet Take 40 mg by mouth 2 (two) times daily.    [provider]  Plecanatide (TRULANCE) 3 MG TABS Take 3 mg by mouth daily.    [provider]  Plecanatide (TRULANCE) 3 MG TABS Take by mouth. 05/01/19   [provider]  propranolol  (INDERAL ) 40 MG tablet TAKE 2 TABLETS TWICE A DAY Patient not taking: Reported on 05/11/2022 10/18/18   Onita Duos, MD  QUEtiapine  (SEROQUEL ) 25 MG tablet Take 12.5-25 mg by mouth at bedtime.    [provider]  rotigotine  (NEUPRO ) 6 MG/24HR Place 1 patch onto the skin daily. 05/25/19   Tat, Asberry RAMAN, DO  sildenafil  (REVATIO )  20 MG tablet Take 1 tablet (20 mg total) by mouth 3 (three) times daily. 01/10/18   Sater, Charlie LABOR, MD  sodium chloride  (OCEAN) 0.65 % nasal spray Place 1 spray into the nose 2 (two) times daily.    [provider]  sucralfate (CARAFATE) 1 GM/10ML suspension Take 1 g by mouth in the morning, at noon, in the evening, and at bedtime.    [provider]  tadalafil (CIALIS) 20 MG tablet TAKE 1 TABLET DAILY AS NEEDED FOR ERECTILE DYSFUNCTION 02/27/19   [provider]  tamsulosin (FLOMAX) 0.4 MG CAPS capsule Take 0.8 mg by mouth at bedtime.    [provider]  traZODone (DESYREL) 100 MG tablet Take 100 mg by mouth at bedtime.     [provider]    Allergies: Patient has no known allergies.    Review of Systems  Gastrointestinal:  Positive for constipation.  All other systems reviewed and are negative.   Updated Vital Signs BP (!) 183/114 (BP Location: Right Arm) Comment: pt has not taken his BP medication today  Pulse 83   Temp 98.3 F (36.8 C) (Oral)   Resp 16   Ht 5' 8 (1.727 m)   Wt 110.2 kg   SpO2 99%   BMI 36.94 kg/m   Physical Exam Vitals and nursing note reviewed.  HENT:     Mouth/Throat:     Pharynx: Oropharynx is clear.  Cardiovascular:     Rate and Rhythm: Normal rate.     Pulses: Normal pulses.  Pulmonary:     Effort: Pulmonary effort is normal.     Breath sounds: Normal breath sounds.  Abdominal:     General: Bowel sounds are normal.     Palpations: Abdomen is soft.     Tenderness: There is generalized abdominal tenderness. There is guarding. There is no rebound. Negative signs include Murphy's sign, Rovsing's sign and McBurney's sign.     Comments: Generalized tenderness to the caudal abdomen.  Patient cannot pinpoint 1 spot for pain.  Skin:    General: Skin is warm and dry.  Neurological:     General: No focal deficit present.     Mental Status: He is alert.     GCS: GCS eye subscore is 4. GCS verbal subscore is 5. GCS motor subscore is 6.     Cranial Nerves: Cranial nerves 2-12 are intact.     Sensory: Sensation is intact.     Comments: Patient had equal strength.  Patient was able to have a equal smile.  Patient has Parkinson's at baseline.  However he was able to stand at bedside.     (all labs ordered are listed, but only abnormal results are displayed) Labs Reviewed  URINALYSIS, ROUTINE W REFLEX MICROSCOPIC - Abnormal; Notable for the following components:      Result Value   Ketones, ur 5 (*)    All other components within normal limits  COMPREHENSIVE METABOLIC PANEL WITH GFR - Abnormal; Notable for the following components:   Glucose, Bld 134 (*)     All other components within normal limits  CBC WITH DIFFERENTIAL/PLATELET  LIPASE, BLOOD    EKG: None  Radiology: CT ABDOMEN PELVIS W CONTRAST Result Date: 09/24/2024 EXAM: CT ABDOMEN AND PELVIS WITH CONTRAST 09/24/2024 01:28:20 PM TECHNIQUE: CT of the abdomen and pelvis was performed with the administration of 100 mL of iohexol  (OMNIPAQUE ) 300 MG/ML solution. Multiplanar reformatted images are provided for review. Automated exposure control, iterative reconstruction, and/or weight-based adjustment  of the mA/kV was utilized to reduce the radiation dose to as low as reasonably achievable. COMPARISON: 12/26/2016 CLINICAL HISTORY: Abdominal pain and constipation. FINDINGS: LOWER CHEST: Linear subsegmental atelectasis or scarring in the right lower lobe and to a lesser extent in the left lower lobe. Mild cardiomegaly. LIVER: Suspected hepatic steatosis. GALLBLADDER AND BILE DUCTS: Gallbladder is unremarkable. No biliary ductal dilatation. SPLEEN: No acute abnormality. PANCREAS: No acute abnormality. ADRENAL GLANDS: No acute abnormality. KIDNEYS, URETERS AND BLADDER: Normal bilateral renal cysts warrant no further imaging workup. No stones in the kidneys or ureters. No hydronephrosis. No perinephric or periureteral stranding. Urinary bladder is unremarkable. GI AND BOWEL: Peg tube noted with extension tubing from the peg tube coiled in the stomach. Mild prominence of stool in the colon may reflect mild constipation but is not striking. There is no bowel obstruction. PERITONEUM AND RETROPERITONEUM: No ascites. No free air. VASCULATURE: Aorta is normal in caliber. Minimal atheromatous vascular calcification of the common iliac arteries. LYMPH NODES: No lymphadenopathy. REPRODUCTIVE ORGANS: Prostatomegaly. BONES AND SOFT TISSUES: Degenerative hip arthropathy bilaterally. No acute osseous abnormality. No focal soft tissue abnormality. IMPRESSION: 1. Mild prominence of stool in the colon, which can reflect mild  constipation. 2. Percutaneous endoscopic gastrostomy tube present with extension tubing coiled in the stomach. 3. Suspected hepatic steatosis. 4. Prostatomegaly. 5. Minimal atheromatous vascular calcification of the common iliac arteries. 6. Mild cardiomegaly. 7. Linear subsegmental atelectasis or scarring in the right lower lobe and, to a lesser extent, the left lower lobe. Electronically signed by: Ryan Salvage MD 09/24/2024 02:27 PM EST RP Workstation: [id number]   CT Head Wo Contrast Result Date: 09/24/2024 EXAM: CT HEAD WITHOUT CONTRAST 09/24/2024 01:28:20 PM TECHNIQUE: CT of the head was performed without the administration of intravenous contrast. Automated exposure control, iterative reconstruction, and/or weight based adjustment of the mA/kV was utilized to reduce the radiation dose to as low as reasonably achievable. COMPARISON: None available. CLINICAL HISTORY: Headaches. FINDINGS: BRAIN AND VENTRICLES: No acute hemorrhage. No evidence of acute infarct. No hydrocephalus. No extra-axial collection. No mass effect or midline shift. ORBITS: No acute abnormality. SINUSES: No acute abnormality. SOFT TISSUES AND SKULL: No acute soft tissue abnormality. No skull fracture. IMPRESSION: 1. No acute intracranial abnormality. Electronically signed by: Donnice Mania MD 09/24/2024 01:36 PM EST RP Workstation: [id number]     Procedures   Medications Ordered in the ED  polyethylene glycol (MIRALAX  / GLYCOLAX ) packet 17 g (17 g Oral Patient Refused/Not Given 09/24/24 1627)  acetaminophen  (TYLENOL ) tablet 650 mg (650 mg Oral Given 09/24/24 1257)  iohexol  (OMNIPAQUE ) 300 MG/ML solution 100 mL (100 mLs Intravenous Contrast Given 09/24/24 1325)                                    Medical Decision Making Amount and/or Complexity of Data Reviewed Labs: ordered. Radiology: ordered.  Risk OTC drugs. Prescription drug management.   Impression: 54 year old male presenting with abdominal pain.   Differential diagnoses include constipation, small bowel obstruction, pancreatitis, cholecystitis  Additional History: Patient is able to provide history.  I also reviewed other outpatient notes  Labs: CBC showed no acute changes.  CMP shows no acute changes.  Lipase shows 26.  Urinalysis showed no acute changes.  Imaging: CT abdomen and pelvis shows mild constipation.  I reviewed these images and agree with radiology's interpretation.  Head CT without contrast showed no acute changes.  I reviewed the images and agree  with radiology's interpretation.  ED Course/Meds: 54 year old male presenting with abdominal pain.  Patient was well-appearing and in no acute distress.  Patient reports that he has been constipated for the past few days however right when he arrived to the ER he took a bowel movement.  He also complains of some generalized abdominal pain.  Because of this labs and imaging were obtained.  Labs showed no acute changes.  CT of the abdomen showed mild constipation which could be causing his abdominal pain.  Patient was given MiraLAX  before discharge.  He was educated that he might not have a bowel movement here in the ER but should help him take 1 at home.  Patient was also described outpatient MiraLAX  and magnesium  citrate to help with the constipation. Patient also reported of a headache that he has been having every day for the past week or so.  Patient reports that he has a bad pillow that he sleeps on and whenever he wakes up he has a headache.  He reports that he has not been sleeping well either and that can sometimes make his Parkinson's worse.  He requested a CT of the head which showed no acute changes.  Patient was given Tylenol  and reports that his headache was starting to feel little bit better.  Patient also asked if he could receive sleeping medication while he was here.  I spoke to him that he should follow-up with his primary care for both the continued headaches and sleeping  medication. Patient was educated on signs and symptoms of when to return to the ER.  Patient verbally agreed to the plan stated above.  All questions were answered.  Patient remained stable while in the ER and at discharge.      Final diagnoses:  Constipation, unspecified constipation type    ED Discharge Orders          Ordered    polyethylene glycol (MIRALAX ) 17 g packet  Daily        09/24/24 1543    magnesium  citrate SOLN   Once        09/24/24 1543               Rosaline Almarie MATSU, PA-C 09/24/24 1720  "

## 2024-09-24 NOTE — ED Notes (Signed)
 Pt to the restroom and had a small BM, but reported difficulty.

## 2024-09-24 NOTE — ED Notes (Signed)
 Pt ambulating with shuffling gait at his baseline with one person assist.
